# Patient Record
Sex: Male | Born: 1955 | Race: White | Hispanic: No | Marital: Single | Smoking: Former smoker
Health system: Southern US, Community
[De-identification: ages and names within clinical notes are randomized; demographics above are authoritative.]

## PROBLEM LIST (undated history)

## (undated) DIAGNOSIS — L409 Psoriasis, unspecified: Secondary | ICD-10-CM

## (undated) DIAGNOSIS — T8859XA Other complications of anesthesia, initial encounter: Secondary | ICD-10-CM

## (undated) DIAGNOSIS — U071 COVID-19: Secondary | ICD-10-CM

## (undated) DIAGNOSIS — Z789 Other specified health status: Secondary | ICD-10-CM

## (undated) DIAGNOSIS — K219 Gastro-esophageal reflux disease without esophagitis: Secondary | ICD-10-CM

## (undated) HISTORY — DX: Psoriasis, unspecified: L40.9

## (undated) HISTORY — PX: ELBOW SURGERY: SHX618

---

## 2012-05-28 ENCOUNTER — Other Ambulatory Visit: Payer: Self-pay | Admitting: Family Medicine

## 2012-05-28 ENCOUNTER — Encounter: Payer: Self-pay | Admitting: Family Medicine

## 2012-05-28 ENCOUNTER — Ambulatory Visit (INDEPENDENT_AMBULATORY_CARE_PROVIDER_SITE_OTHER): Payer: BC Managed Care – HMO | Admitting: Family Medicine

## 2012-05-28 VITALS — BP 120/90 | HR 72 | Temp 98.4°F | Ht 69.0 in | Wt 207.0 lb

## 2012-05-28 DIAGNOSIS — Z23 Encounter for immunization: Secondary | ICD-10-CM

## 2012-05-28 DIAGNOSIS — Z79899 Other long term (current) drug therapy: Secondary | ICD-10-CM

## 2012-05-28 DIAGNOSIS — L409 Psoriasis, unspecified: Secondary | ICD-10-CM | POA: Insufficient documentation

## 2012-05-28 DIAGNOSIS — F17200 Nicotine dependence, unspecified, uncomplicated: Secondary | ICD-10-CM

## 2012-05-28 DIAGNOSIS — Z136 Encounter for screening for cardiovascular disorders: Secondary | ICD-10-CM

## 2012-05-28 DIAGNOSIS — Z0000 Encounter for general adult medical examination without abnormal findings: Secondary | ICD-10-CM

## 2012-05-28 DIAGNOSIS — R972 Elevated prostate specific antigen [PSA]: Secondary | ICD-10-CM

## 2012-05-28 DIAGNOSIS — Z1211 Encounter for screening for malignant neoplasm of colon: Secondary | ICD-10-CM

## 2012-05-28 DIAGNOSIS — Z720 Tobacco use: Secondary | ICD-10-CM

## 2012-05-28 DIAGNOSIS — Z125 Encounter for screening for malignant neoplasm of prostate: Secondary | ICD-10-CM

## 2012-05-28 LAB — CBC WITH DIFFERENTIAL/PLATELET
Basophils Absolute: 0 10*3/uL (ref 0.0–0.1)
Eosinophils Relative: 2.4 % (ref 0.0–5.0)
HCT: 45.8 % (ref 39.0–52.0)
Hemoglobin: 15.2 g/dL (ref 13.0–17.0)
Lymphs Abs: 1.5 10*3/uL (ref 0.7–4.0)
MCV: 99.1 fl (ref 78.0–100.0)
Monocytes Absolute: 0.5 10*3/uL (ref 0.1–1.0)
Monocytes Relative: 7.1 % (ref 3.0–12.0)
Neutro Abs: 5.4 10*3/uL (ref 1.4–7.7)
Platelets: 195 10*3/uL (ref 150.0–400.0)
RDW: 13.9 % (ref 11.5–14.6)

## 2012-05-28 LAB — LIPID PANEL
Cholesterol: 173 mg/dL (ref 0–200)
HDL: 38.8 mg/dL — ABNORMAL LOW (ref >39.00)
LDL Cholesterol: 98 mg/dL (ref 0–99)
VLDL: 35.8 mg/dL (ref 0.0–40.0)

## 2012-05-28 LAB — COMPREHENSIVE METABOLIC PANEL
Alkaline Phosphatase: 58 U/L (ref 39–117)
CO2: 24 mEq/L (ref 19–32)
Creatinine, Ser: 1.1 mg/dL (ref 0.4–1.5)
GFR: 72.65 mL/min (ref >60.00)
Glucose, Bld: 96 mg/dL (ref 70–99)
Sodium: 138 mEq/L (ref 135–145)
Total Bilirubin: 0.5 mg/dL (ref 0.3–1.2)
Total Protein: 6.9 g/dL (ref 6.0–8.3)

## 2012-05-28 MED ORDER — VARENICLINE TARTRATE 0.5 MG X 11 & 1 MG X 42 PO MISC: ORAL | Status: DC

## 2012-05-28 MED ORDER — AZITHROMYCIN 250 MG PO TABS: ORAL_TABLET | ORAL | Status: AC

## 2012-05-28 MED ORDER — SILDENAFIL CITRATE 100 MG PO TABS: 50.0000 mg | ORAL_TABLET | Freq: Every day | ORAL | Status: DC | PRN

## 2012-05-28 MED ORDER — METHOTREXATE 2.5 MG PO TABS: ORAL_TABLET | ORAL | Status: DC

## 2012-05-28 NOTE — Progress Notes (Signed)
Subjective:    Patient ID: Peter Rose, male    DOB: July 24, 1956, 56 y.o.   MRN: [id number]  HPI  56 yo male here to establish care and for CPX.  Psorasis- has been well controlled on MTX.  No current plaques.  Tobacco abuse- has smoke 1 ppd most of his adult life.  He is ready to quit.  Has never had a colonoscopy. NO known family h/o prostate CA.  Patient Active Problem List  Diagnosis  . Routine general medical examination at a health care facility  . Psoriasis  . Tobacco abuse   Past Medical History  Diagnosis Date  . Psoriasis    No past surgical history on file. History  Substance Use Topics  . Smoking status: Current Everyday Smoker  . Smokeless tobacco: Not on file  . Alcohol Use: Not on file   No family history on file. Allergies  Allergen Reactions  . Vicodin (Hydrocodone-Acetaminophen)     Pt feels "wired" with pain meds   Current Outpatient Prescriptions on File Prior to Visit  Medication Sig Dispense Refill  . sildenafil (VIAGRA) 100 MG tablet Take 0.5-1 tablets (50-100 mg total) by mouth daily as needed for erectile dysfunction.  5 tablet  11   The PMH, PSH, Social History, Family History, Medications, and allergies have been reviewed in [facility], and have been updated if relevant.   Review of Systems    See HPI Patient reports no  vision/ hearing changes,anorexia, weight change, fever ,adenopathy, persistant / recurrent hoarseness, swallowing issues, chest pain, edema,persistant / recurrent cough, hemoptysis, dyspnea(rest, exertional, paroxysmal nocturnal), gastrointestinal  bleeding (melena, rectal bleeding), abdominal pain, excessive heart burn, GU symptoms(dysuria, hematuria, pyuria, voiding/incontinence  Issues) syncope, focal weakness, severe memory loss, concerning skin lesions, depression, anxiety, abnormal bruising/bleeding, major joint swelling.    Objective:   Physical Exam BP 120/90  Pulse 72  Temp 98.4 F (36.9 C)  Ht 5\' 9"  (1.753 m)  Wt  207 lb (93.895 kg)  BMI 30.57 kg/m2 General:  overweght male in NAD Eyes:  PERRL Ears:  External ear exam shows no significant lesions or deformities.  Otoscopic examination reveals clear canals, tympanic membranes are intact bilaterally without bulging, retraction, inflammation or discharge. Hearing is grossly normal bilaterally. Nose:  External nasal examination shows no deformity or inflammation. Nasal mucosa are pink and moist without lesions or exudates. Mouth:  Oral mucosa and oropharynx without lesions or exudates.  Teeth in good repair. Neck:  no carotid bruit or thyromegaly no cervical or supraclavicular lymphadenopathy  Lungs:  Normal respiratory effort, chest expands symmetrically. Lungs are clear to auscultation, no crackles or wheezes. Heart:  Normal rate and regular rhythm. S1 and S2 normal without gallop, murmur, click, rub or other extra sounds. Abdomen:  Bowel sounds positive,abdomen soft and non-tender without masses, organomegaly or hernias noted. Pulses:  R and L posterior tibial pulses are full and equal bilaterally  Extremities:  no edema       Assessment & Plan:   1. Routine general medical examination at a health care facility  Reviewed preventive care protocols, scheduled due services, and updated immunizations Discussed nutrition, exercise, diet, and healthy lifestyle.    2. Tobacco abuse  Ready to quit.  Discussed tx options.  He would like to try chantix- discussed possible side effects- starter pack sent to pharmacy.   3. Long term use of drug  CBC with Differential, Comprehensive metabolic panel  4. Screening for ischemic heart disease  Lipid Panel  5. Screening for  colon cancer  Ambulatory referral to Gastroenterology  6. Screening for prostate cancer  Prostate cancer screening and PSA options (with potential risks and benefits of testing vs not testing) were discussed along with recent recs/guidelines.  He desires testing PSA at this point.  PSA  7.  Immunization due  Tdap vaccine greater than or equal to 7yo IM

## 2012-05-28 NOTE — Patient Instructions (Addendum)
Wonderful to meet you. We will call you with your lab results from today. Please stop by to see Shirlee Limerick on your way out to set up your colonoscopy appointment.

## 2012-05-29 ENCOUNTER — Telehealth: Payer: Self-pay | Admitting: *Deleted

## 2012-05-29 NOTE — Telephone Encounter (Signed)
Advised patient of lab results.  He doesn't want referral to urologist at this time, says he is not having any prostate problems.

## 2012-05-29 NOTE — Telephone Encounter (Signed)
Noted  

## 2012-06-25 ENCOUNTER — Telehealth: Payer: Self-pay | Admitting: Family Medicine

## 2012-06-25 NOTE — Telephone Encounter (Signed)
Hi Dr. Dayton Martes,  Mr. 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 This is a new patient as of 05/28/12 (which was first & last appointment for this patient) so I wasn't sure if there was something else that may need to be done so I asked Jacki Cones and she said to send you a message because the DOT physical is a little different than a standard physical & since he was a new patient I wasn't for sure if a physical was actually done.  Please let me know if the patient needs to schedule for a DOT physical or if his last visit is sufficient to complete a DOT form.  Thanks  Marrianne Mood Dob: 2056/05/07

## 2012-06-25 NOTE — Telephone Encounter (Signed)
Yes he needs an appt for a DOT physical. Thank you.

## 2012-06-26 NOTE — Telephone Encounter (Signed)
Noted! Thank you

## 2012-06-26 NOTE — Telephone Encounter (Signed)
Pt needed this fairly soon, so I put 2 slots together on 9/18.

## 2012-07-02 ENCOUNTER — Ambulatory Visit: Payer: BC Managed Care – HMO | Admitting: Family Medicine

## 2012-08-13 ENCOUNTER — Encounter: Payer: BC Managed Care – HMO | Admitting: Internal Medicine

## 2012-11-14 ENCOUNTER — Other Ambulatory Visit: Payer: Self-pay | Admitting: *Deleted

## 2012-11-14 MED ORDER — LEVOCETIRIZINE DIHYDROCHLORIDE 5 MG PO TABS: 5.0000 mg | ORAL_TABLET | Freq: Every evening | ORAL | Status: DC

## 2012-11-14 NOTE — Telephone Encounter (Signed)
Received refill request for methotrexate. Pt is also on fexofenadine 180 mg, but is requesting an rx for something stronger, because it is no longer working for him. Pharmacy added a note that his insurance will cover generic xyzal for a $0 copay.

## 2012-11-14 NOTE — Telephone Encounter (Signed)
Spoke with patient.  He said pt has been getting this from a doctor in [place] but he says you told him you were ok with prescribing this as long as he gets periodic lab work.  Please advise.

## 2012-11-14 NOTE — Telephone Encounter (Signed)
Noted  Ok to refill

## 2012-11-14 NOTE — Telephone Encounter (Signed)
I do not typically prescribe methotrexate.  Who has been prescribing this for him?  Xyzal rx sent to his pharmacy.

## 2012-11-17 MED ORDER — METHOTREXATE 2.5 MG PO TABS: ORAL_TABLET | ORAL | Status: DC

## 2012-11-17 NOTE — Telephone Encounter (Signed)
In 3 months

## 2012-11-17 NOTE — Telephone Encounter (Signed)
Advised patient, medicine sent to pharmacy.  Pt is asking when do you want to check his labs.

## 2012-11-18 NOTE — Telephone Encounter (Signed)
Lab appt scheduled.

## 2013-01-19 ENCOUNTER — Telehealth: Payer: Self-pay

## 2013-01-19 MED ORDER — BUPROPION HCL ER (SR) 150 MG PO TB12: 150.0000 mg | ORAL_TABLET | Freq: Two times a day (BID) | ORAL | Status: DC

## 2013-01-19 NOTE — Telephone Encounter (Signed)
Pt does not have h/o seizures.  He says he has not tried any other medication for smoking cessation.

## 2013-01-19 NOTE — Telephone Encounter (Signed)
Please verify he does not have h/o seizures.  Another medication would be zyban, or wellbutrin.  Has he tried this for smoking cessation?

## 2013-01-19 NOTE — Telephone Encounter (Signed)
rx sent to his pharmacy.  Please have him call in 3 weeks with an update.  Please also read package insert for side effects since he did not have appt to discuss.  If he has any questions, please feel free to call me.

## 2013-01-19 NOTE — Telephone Encounter (Signed)
Pt  tried Chantix 3 weeks and it did not work to help pt stop smoking/ Pt presently smoking 1 pk daily. Pt wants another med to help stop smoking. [facility].Please advise.

## 2013-02-10 ENCOUNTER — Telehealth: Payer: Self-pay

## 2013-02-10 NOTE — Telephone Encounter (Signed)
Pt left v/m requesting call back to give fax # to release med records to. Left v/m for pt to call back.(pt will need to sign record release and the Health port will copy records).

## 2013-02-10 NOTE — Telephone Encounter (Signed)
Spoke with patient, he has not started zyban yet but is going to, advised as instructed below.

## 2013-02-11 NOTE — Telephone Encounter (Signed)
Left vm for pt to callback 

## 2013-02-16 ENCOUNTER — Other Ambulatory Visit: Payer: BC Managed Care – HMO

## 2013-02-16 NOTE — Telephone Encounter (Signed)
Patient notified as instructed by telephone. Pt will come to office to sign record release.

## 2013-06-28 ENCOUNTER — Other Ambulatory Visit: Payer: Self-pay | Admitting: Family Medicine

## 2013-06-29 NOTE — Telephone Encounter (Signed)
Ok to refill one time only.  Needs to have labs for further refills.

## 2013-07-01 ENCOUNTER — Other Ambulatory Visit: Payer: Self-pay | Admitting: Family Medicine

## 2013-07-01 NOTE — Telephone Encounter (Signed)
Refill request for viagra.  Patient established care 05/2012 but has not been seen since, and has no upcoming appts.

## 2013-08-03 ENCOUNTER — Other Ambulatory Visit: Payer: Self-pay | Admitting: Family Medicine

## 2013-08-03 NOTE — Telephone Encounter (Signed)
Electronic refill request.  Please advise. 

## 2013-08-04 ENCOUNTER — Other Ambulatory Visit: Payer: Self-pay | Admitting: Internal Medicine

## 2013-08-04 ENCOUNTER — Other Ambulatory Visit: Payer: Self-pay | Admitting: *Deleted

## 2013-08-04 DIAGNOSIS — L409 Psoriasis, unspecified: Secondary | ICD-10-CM

## 2013-08-04 MED ORDER — METHOTREXATE 2.5 MG PO TABS: ORAL_TABLET | ORAL | Status: DC

## 2013-08-04 NOTE — Telephone Encounter (Signed)
Left detailed message on voicemail to call to schedule lab appt.

## 2013-08-04 NOTE — Telephone Encounter (Signed)
Pt needs BMET prior to refill

## 2013-08-05 ENCOUNTER — Other Ambulatory Visit (INDEPENDENT_AMBULATORY_CARE_PROVIDER_SITE_OTHER): Payer: BC Managed Care – PPO

## 2013-08-05 DIAGNOSIS — L409 Psoriasis, unspecified: Secondary | ICD-10-CM

## 2013-08-05 DIAGNOSIS — L408 Other psoriasis: Secondary | ICD-10-CM

## 2013-08-05 LAB — COMPREHENSIVE METABOLIC PANEL
AST: 29 U/L (ref 0–37)
Albumin: 4.1 g/dL (ref 3.5–5.2)
Alkaline Phosphatase: 67 U/L (ref 39–117)
Glucose, Bld: 99 mg/dL (ref 70–99)
Sodium: 139 mEq/L (ref 135–145)
Total Bilirubin: 0.6 mg/dL (ref 0.3–1.2)
Total Protein: 7.2 g/dL (ref 6.0–8.3)

## 2013-08-05 LAB — CBC
HCT: 45.7 % (ref 39.0–52.0)
MCHC: 34.6 g/dL (ref 30.0–36.0)
MCV: 96.6 fl (ref 78.0–100.0)
RBC: 4.73 Mil/uL (ref 4.22–5.81)
RDW: 14.7 % — ABNORMAL HIGH (ref 11.5–14.6)

## 2013-09-01 ENCOUNTER — Other Ambulatory Visit: Payer: Self-pay | Admitting: Family Medicine

## 2013-09-14 ENCOUNTER — Telehealth: Payer: Self-pay | Admitting: Family Medicine

## 2013-09-14 NOTE — Telephone Encounter (Signed)
I do not see that info or dx for it -- what symptoms does he have and what was he dx with originally -- ? GERD or gastritis  Also for insurance purposes has he tried zantac, prilosec or other meds otc?  Thanks -please let me know

## 2013-09-14 NOTE — Telephone Encounter (Signed)
Pt wants to be prescribed Nexium.  He says we have all of his records from his previous office and we should be able to see where he was prescribed this by his previous doctor. [facility] on [address] in [place]

## 2013-09-16 MED ORDER — ESOMEPRAZOLE MAGNESIUM 40 MG PO PACK: 40.0000 mg | PACK | Freq: Every day | ORAL | Status: DC

## 2013-09-16 NOTE — Telephone Encounter (Signed)
Pt has GERD and has tried prilosec, zantac, and protonix and the only thing that has worked is the Nexium, pt usually gets 3 months supply (#90) and takes it once daily, please advise  Also back in April he signed a release so we could get his medical records so I will send Peter Rose. Message requesting the status of his records

## 2013-09-16 NOTE — Telephone Encounter (Signed)
I will send it in - follow up with PCP if no improvement

## 2013-09-17 MED ORDER — ESOMEPRAZOLE MAGNESIUM 40 MG PO CPDR: 40.0000 mg | DELAYED_RELEASE_CAPSULE | Freq: Every day | ORAL | Status: DC

## 2013-09-17 NOTE — Telephone Encounter (Signed)
Pt.notified

## 2013-09-28 ENCOUNTER — Ambulatory Visit (INDEPENDENT_AMBULATORY_CARE_PROVIDER_SITE_OTHER): Payer: BC Managed Care – PPO | Admitting: Internal Medicine

## 2013-09-28 ENCOUNTER — Encounter: Payer: Self-pay | Admitting: Internal Medicine

## 2013-09-28 VITALS — BP 136/82 | HR 73 | Temp 98.0°F | Ht 69.0 in | Wt 213.5 lb

## 2013-09-28 DIAGNOSIS — J069 Acute upper respiratory infection, unspecified: Secondary | ICD-10-CM

## 2013-09-28 MED ORDER — HYDROCODONE-HOMATROPINE 5-1.5 MG/5ML PO SYRP: 5.0000 mL | ORAL_SOLUTION | Freq: Three times a day (TID) | ORAL | Status: DC | PRN

## 2013-09-28 MED ORDER — AZITHROMYCIN 250 MG PO TABS: ORAL_TABLET | ORAL | Status: DC

## 2013-09-28 MED ORDER — SILDENAFIL CITRATE 100 MG PO TABS: ORAL_TABLET | ORAL | Status: DC

## 2013-09-28 NOTE — Progress Notes (Signed)
HPI  Pt presents to the clinic today with c/o cough, headache and nasal congestion.  This started about 1 week ago. He denies fever but has had chills and body aches. He has tried Claritin, cough drops and Mucinex without any relief. He has no history of allergies or asthma. He is a current smoker.  Review of Systems      Past Medical History  Diagnosis Date  . Psoriasis     History reviewed. No pertinent family history.  History   Social History  . Marital Status: Married    Spouse Name: N/A    Number of Children: N/A  . Years of Education: N/A   Occupational History  . Not on file.   Social History Main Topics  . Smoking status: Current Every Day Smoker  . Smokeless tobacco: Not on file  . Alcohol Use: Yes  . Drug Use: No  . Sexual Activity: Not on file   Other Topics Concern  . Not on file   Social History Narrative  . No narrative on file    Allergies  Allergen Reactions  . Vicodin [Hydrocodone-Acetaminophen]     Pt feels "wired" with pain meds     Constitutional: Positive headache, fatigue. Denies fever or abrupt weight changes.  HEENT:  Positive sore throat. Denies eye redness, eye pain, pressure behind the eyes, facial pain, nasal congestion, ear pain, ringing in the ears, wax buildup, runny nose or bloody nose. Respiratory: Positive cough. Denies difficulty breathing or shortness of breath.  Cardiovascular: Denies chest pain, chest tightness, palpitations or swelling in the hands or feet.   No other specific complaints in a complete review of systems (except as listed in HPI above).  Objective:   BP 136/82  Pulse 73  Temp(Src) 98 F (36.7 C) (Oral)  Ht 5\' 9"  (1.753 m)  Wt 213 lb 8 oz (96.843 kg)  BMI 31.51 kg/m2  SpO2 97% Wt Readings from Last 3 Encounters:  09/28/13 213 lb 8 oz (96.843 kg)  05/28/12 207 lb (93.895 kg)     General: Appears his stated age, well developed, well nourished in NAD. HEENT: Head: normal shape and size; Eyes:  sclera white, no icterus, conjunctiva pink, PERRLA and EOMs intact; Ears: Tm's gray and intact, normal light reflex; Nose: mucosa pink and moist, septum midline; Throat/Mouth: + PND. Teeth present, mucosa erythematous and moist, no exudate noted, no lesions or ulcerations noted.  Neck: Mild cervical lymphadenopathy. Neck supple, trachea midline. No massses, lumps or thyromegaly present.  Cardiovascular: Normal rate and rhythm. S1,S2 noted.  No murmur, rubs or gallops noted. No JVD or BLE edema. No carotid bruits noted. Pulmonary/Chest: Normal effort and positive vesicular breath sounds. No respiratory distress. No wheezes, rales or ronchi noted.      Assessment & Plan:   Upper Respiratory Infection  Get some rest and drink plenty of water Do salt water gargles for the sore throat eRx for Azithromax x 5 days eRx for Hycodan cough syrup  RTC as needed or if symptoms persist.

## 2013-09-28 NOTE — Progress Notes (Signed)
Pre-visit discussion using our clinic review tool. No additional management support is needed unless otherwise documented below in the visit note.  

## 2013-09-28 NOTE — Patient Instructions (Signed)

## 2013-09-29 ENCOUNTER — Telehealth: Payer: Self-pay

## 2013-09-29 MED ORDER — BENZONATATE 200 MG PO CAPS: 200.0000 mg | ORAL_CAPSULE | Freq: Three times a day (TID) | ORAL | Status: DC | PRN

## 2013-09-29 NOTE — Telephone Encounter (Signed)
Pt notified Rx sent and hycodan added to med intol list

## 2013-09-29 NOTE — Telephone Encounter (Signed)
Pt called and pt had a lot of coughing last night but cannot take the Hycodan; caused pt to feel "wired" and jittery, could not sleep.[facility]. Pt request cb. Pt is not having any difficulty breathing or rash. Pt is not going to take anymore Hycodan.Pt does not want to wait for call back this afternoon at Nicki Reaper NP return.Please advise.

## 2013-09-29 NOTE — Telephone Encounter (Signed)
Let's try some tessalon for cough - [facility] to [facility] Please add the hycodan to his med intol list

## 2013-10-26 ENCOUNTER — Other Ambulatory Visit: Payer: Self-pay | Admitting: Family Medicine

## 2013-10-26 NOTE — Telephone Encounter (Signed)
Pt requesting medication refill. Last ov 09/2013. pls advise

## 2013-11-27 ENCOUNTER — Ambulatory Visit: Payer: Self-pay | Admitting: Orthopedic Surgery

## 2013-12-21 ENCOUNTER — Other Ambulatory Visit: Payer: Self-pay | Admitting: *Deleted

## 2013-12-21 NOTE — Telephone Encounter (Signed)
Received faxed refill request from pharmacy.  Last refill 10/26/13 #40, last office visit 09/28/13. Is it okay to refill medication?

## 2013-12-22 MED ORDER — METHOTREXATE 2.5 MG PO TABS: ORAL_TABLET | ORAL | Status: DC

## 2014-01-20 ENCOUNTER — Other Ambulatory Visit: Payer: Self-pay | Admitting: Family Medicine

## 2014-01-20 NOTE — Telephone Encounter (Signed)
Spoke to pt and scheduled ov on 01/22/14 to receive medication refill

## 2014-01-22 ENCOUNTER — Ambulatory Visit (INDEPENDENT_AMBULATORY_CARE_PROVIDER_SITE_OTHER): Payer: BC Managed Care – PPO | Admitting: Internal Medicine

## 2014-01-22 ENCOUNTER — Encounter: Payer: Self-pay | Admitting: Internal Medicine

## 2014-01-22 VITALS — BP 118/78 | HR 81 | Temp 98.2°F | Wt 215.5 lb

## 2014-01-22 DIAGNOSIS — L409 Psoriasis, unspecified: Secondary | ICD-10-CM

## 2014-01-22 DIAGNOSIS — L408 Other psoriasis: Secondary | ICD-10-CM

## 2014-01-22 DIAGNOSIS — J309 Allergic rhinitis, unspecified: Secondary | ICD-10-CM

## 2014-01-22 MED ORDER — LEVOCETIRIZINE DIHYDROCHLORIDE 5 MG PO TABS: ORAL_TABLET | ORAL | Status: DC

## 2014-01-22 MED ORDER — METHOTREXATE 2.5 MG PO TABS: ORAL_TABLET | ORAL | Status: DC

## 2014-01-22 NOTE — Progress Notes (Signed)
Subjective:    Patient ID: Peter Rose, male    DOB: 08-15-1956, 58 y.o.   MRN: [id number]  HPI  Pt presents to the clinic today with c/o medication refills. He request his xyzal refill but was told he needed to be seen for further refills. His allergies are acting up. He also reports that he needs refills on his methotrexate. He doesn't take it daily but needs it for when is psoriasis flares up. He does have a small patch on his left forearm now.  Review of Systems   Past Medical History  Diagnosis Date  . Psoriasis     Current Outpatient Prescriptions  Medication Sig Dispense Refill  . levocetirizine (XYZAL) 5 MG tablet take 1 tablet by mouth every evening  30 tablet  0  . methotrexate (RHEUMATREX) 2.5 MG tablet take 10 tablets by mouth every week  40 tablet  0  . sildenafil (VIAGRA) 100 MG tablet take 1 tablet by mouth EVERY 36 HOURS if needed  8 tablet  0   No current facility-administered medications for this visit.    Allergies  Allergen Reactions  . Codeine Other (See Comments)    Eels wirey and jittery  . Other     HYCODAN: Makes pt feel weird/jittery  . Vicodin [Hydrocodone-Acetaminophen]     Pt feels "wired" with pain meds    History reviewed. No pertinent family history.  History   Social History  . Marital Status: Married    Spouse Name: N/A    Number of Children: N/A  . Years of Education: N/A   Occupational History  . Not on file.   Social History Main Topics  . Smoking status: Current Every Day Smoker  . Smokeless tobacco: Not on file  . Alcohol Use: Yes     Comment: occasional  . Drug Use: No  . Sexual Activity: Not on file   Other Topics Concern  . Not on file   Social History Narrative  . No narrative on file     Constitutional: Denies fever, malaise, fatigue, headache or abrupt weight changes.  Skin: Pt reports psoriasis on left forearm.  HEENT: Pt reports nasal congestion. Denies eye pain, eye redness, ear pain, ringing in the  ears, wax buildup, runny nose,  bloody nose, or sore throat. Respiratory: Denies difficulty breathing, shortness of breath, cough or sputum production.     No other specific complaints in a complete review of systems (except as listed in HPI above).  Objective:   Physical Exam  BP 118/78  Pulse 81  Temp(Src) 98.2 F (36.8 C) (Oral)  Wt 215 lb 8 oz (97.75 kg)  SpO2 97% Wt Readings from Last 3 Encounters:  01/22/14 215 lb 8 oz (97.75 kg)  09/28/13 213 lb 8 oz (96.843 kg)  05/28/12 207 lb (93.895 kg)    General: Appears his stated age, well developed, well nourished in NAD. Skin: 2 small round psoriasis patches noted on left forearm. HEENT: Head: normal shape and size; Eyes: sclera white, no icterus, conjunctiva pink, PERRLA and EOMs intact; Ears: Tm's gray and intact, normal light reflex; Nose: mucosa boggy and moist, septum midline; Throat/Mouth: Teeth present, mucosa pink and moist, no exudate, lesions or ulcerations noted.  Neck: Normal range of motion. Neck supple, trachea midline. No massses, lumps or thyromegaly present.  Cardiovascular: Normal rate and rhythm. S1,S2 noted.  No murmur, rubs or gallops noted. No JVD or BLE edema. No carotid bruits noted. Pulmonary/Chest: Normal effort and positive vesicular breath  sounds. No respiratory distress. No wheezes, rales or ronchi noted.    BMET    Component Value Date/Time   NA 139 08/05/2013 0742   K 4.1 08/05/2013 0742   CL 105 08/05/2013 0742   CO2 27 08/05/2013 0742   GLUCOSE 99 08/05/2013 0742   BUN 11 08/05/2013 0742   CREATININE 1.1 08/05/2013 0742   CALCIUM 9.2 08/05/2013 0742    Lipid Panel     Component Value Date/Time   CHOL 173 05/28/2012 1129   TRIG 179.0* 05/28/2012 1129   HDL 38.80* 05/28/2012 1129   CHOLHDL 4 05/28/2012 1129   VLDL 35.8 05/28/2012 1129   LDLCALC 98 05/28/2012 1129    CBC    Component Value Date/Time   WBC 6.7 08/05/2013 0742   RBC 4.73 08/05/2013 0742   HGB 15.8 08/05/2013 0742   HCT  45.7 08/05/2013 0742   PLT 218.0 08/05/2013 0742   MCV 96.6 08/05/2013 0742   MCHC 34.6 08/05/2013 0742   RDW 14.7* 08/05/2013 0742   LYMPHSABS 1.5 05/28/2012 1129   MONOABS 0.5 05/28/2012 1129   EOSABS 0.2 05/28/2012 1129   BASOSABS 0.0 05/28/2012 1129    Hgb A1C No results found for this basename: HGBA1C         Assessment & Plan:   Allergic Rhinitis:  Flaring up due to the pollen Medication refilled to day  Psoriasis:  rheumetrex refilled today  RTC as needed or if symptoms persist or worsen

## 2014-01-22 NOTE — Progress Notes (Signed)
Pre visit review using our clinic review tool, if applicable. No additional management support is needed unless otherwise documented below in the visit note. 

## 2014-01-22 NOTE — Patient Instructions (Addendum)
Allergic Rhinitis Allergic rhinitis is when the mucous membranes in the nose respond to allergens. Allergens are particles in the air that cause your body to have an allergic reaction. This causes you to release allergic antibodies. Through a chain of events, these eventually cause you to release histamine into the blood stream. Although meant to protect the body, it is this release of histamine that causes your discomfort, such as frequent sneezing, congestion, and an itchy, runny nose.  CAUSES  Seasonal allergic rhinitis (hay fever) is caused by pollen allergens that may come from grasses, trees, and weeds. Year-round allergic rhinitis (perennial allergic rhinitis) is caused by allergens such as house dust mites, pet dander, and mold spores.  SYMPTOMS   Nasal stuffiness (congestion).  Itchy, runny nose with sneezing and tearing of the eyes. DIAGNOSIS  Your health care provider can help you determine the allergen or allergens that trigger your symptoms. If you and your health care provider are unable to determine the allergen, skin or blood testing may be used. TREATMENT  Allergic Rhinitis does not have a cure, but it can be controlled by:  Medicines and allergy shots (immunotherapy).  Avoiding the allergen. Hay fever may often be treated with antihistamines in pill or nasal spray forms. Antihistamines block the effects of histamine. There are over-the-counter medicines that may help with nasal congestion and swelling around the eyes. Check with your health care provider before taking or giving this medicine.  If avoiding the allergen or the medicine prescribed do not work, there are many new medicines your health care provider can prescribe. Stronger medicine may be used if initial measures are ineffective. Desensitizing injections can be used if medicine and avoidance does not work. Desensitization is when a patient is given ongoing shots until the body becomes less sensitive to the allergen.  Make sure you follow up with your health care provider if problems continue. HOME CARE INSTRUCTIONS It is not possible to completely avoid allergens, but you can reduce your symptoms by taking steps to limit your exposure to them. It helps to know exactly what you are allergic to so that you can avoid your specific triggers. SEEK MEDICAL CARE IF:   You have a fever.  You develop a cough that does not stop easily (persistent).  You have shortness of breath.  You start wheezing.  Symptoms interfere with normal daily activities. Document Released: 06/26/2001 Document Revised: 07/22/2013 Document Reviewed: 06/08/2013 Exit[employer] Patient Information 2014 [employer].  

## 2014-01-25 ENCOUNTER — Telehealth: Payer: Self-pay | Admitting: Family Medicine

## 2014-01-25 NOTE — Telephone Encounter (Signed)
Relevant patient education assigned to patient using Emmi. ° °

## 2014-02-15 ENCOUNTER — Other Ambulatory Visit: Payer: Self-pay | Admitting: Family Medicine

## 2014-02-15 NOTE — Telephone Encounter (Signed)
Pt calling for refill status advised refills sent to [facility]. Pt appreciative.

## 2014-02-15 NOTE — Telephone Encounter (Signed)
Last office visit 01/22/14 with Golden Hurter.  Ok to refill?

## 2014-04-04 ENCOUNTER — Other Ambulatory Visit: Payer: Self-pay | Admitting: Family Medicine

## 2014-04-05 NOTE — Telephone Encounter (Signed)
Last ov with [name] Silversmith on 01/22/14.

## 2014-04-06 ENCOUNTER — Ambulatory Visit: Payer: Self-pay | Admitting: Emergency Medicine

## 2014-04-06 LAB — DOT URINE DIP
Glucose,UR: NEGATIVE mg/dL (ref 0–75)
Protein: NEGATIVE
Specific Gravity: 1.01 (ref 1.003–1.030)

## 2014-05-27 ENCOUNTER — Other Ambulatory Visit: Payer: Self-pay | Admitting: Family Medicine

## 2014-06-22 ENCOUNTER — Other Ambulatory Visit: Payer: Self-pay | Admitting: Family Medicine

## 2014-08-05 ENCOUNTER — Other Ambulatory Visit: Payer: Self-pay | Admitting: Family Medicine

## 2014-09-17 ENCOUNTER — Other Ambulatory Visit: Payer: Self-pay | Admitting: Family Medicine

## 2014-09-30 ENCOUNTER — Encounter: Payer: Self-pay | Admitting: Family Medicine

## 2014-09-30 ENCOUNTER — Ambulatory Visit (INDEPENDENT_AMBULATORY_CARE_PROVIDER_SITE_OTHER): Payer: Self-pay | Admitting: Family Medicine

## 2014-09-30 VITALS — BP 118/78 | HR 75 | Temp 98.4°F | Wt 219.0 lb

## 2014-09-30 DIAGNOSIS — IMO0001 Reserved for inherently not codable concepts without codable children: Secondary | ICD-10-CM

## 2014-09-30 DIAGNOSIS — R030 Elevated blood-pressure reading, without diagnosis of hypertension: Secondary | ICD-10-CM

## 2014-09-30 DIAGNOSIS — F4321 Adjustment disorder with depressed mood: Secondary | ICD-10-CM

## 2014-09-30 DIAGNOSIS — F5105 Insomnia due to other mental disorder: Secondary | ICD-10-CM

## 2014-09-30 DIAGNOSIS — L409 Psoriasis, unspecified: Secondary | ICD-10-CM

## 2014-09-30 LAB — COMPREHENSIVE METABOLIC PANEL
ALK PHOS: 61 U/L (ref 39–117)
ALT: 32 U/L (ref 0–53)
AST: 30 U/L (ref 0–37)
Albumin: 4.3 g/dL (ref 3.5–5.2)
BILIRUBIN TOTAL: 0.5 mg/dL (ref 0.2–1.2)
BUN: 14 mg/dL (ref 6–23)
CO2: 27 meq/L (ref 19–32)
CREATININE: 1.2 mg/dL (ref 0.4–1.5)
Calcium: 9.9 mg/dL (ref 8.4–10.5)
Chloride: 104 mEq/L (ref 96–112)
GFR: 67.14 mL/min (ref >60.00)
Glucose, Bld: 87 mg/dL (ref 70–99)
Potassium: 4.4 mEq/L (ref 3.5–5.1)
SODIUM: 138 meq/L (ref 135–145)
Total Protein: 7.3 g/dL (ref 6.0–8.3)

## 2014-09-30 LAB — CBC WITH DIFFERENTIAL/PLATELET
BASOS ABS: 0 10*3/uL (ref 0.0–0.1)
Basophils Relative: 0.6 % (ref 0.0–3.0)
EOS ABS: 0.3 10*3/uL (ref 0.0–0.7)
Eosinophils Relative: 3.7 % (ref 0.0–5.0)
HCT: 47.8 % (ref 39.0–52.0)
Hemoglobin: 16 g/dL (ref 13.0–17.0)
LYMPHS ABS: 2.1 10*3/uL (ref 0.7–4.0)
Lymphocytes Relative: 27.1 % (ref 12.0–46.0)
MCHC: 33.4 g/dL (ref 30.0–36.0)
MCV: 96.2 fl (ref 78.0–100.0)
Monocytes Absolute: 0.8 10*3/uL (ref 0.1–1.0)
Monocytes Relative: 10.9 % (ref 3.0–12.0)
Neutro Abs: 4.5 10*3/uL (ref 1.4–7.7)
Neutrophils Relative %: 57.7 % (ref 43.0–77.0)
PLATELETS: 227 10*3/uL (ref 150.0–400.0)
RBC: 4.97 Mil/uL (ref 4.22–5.81)
RDW: 13.8 % (ref 11.5–15.5)
WBC: 7.8 10*3/uL (ref 4.0–10.5)

## 2014-09-30 MED ORDER — TRAZODONE HCL 50 MG PO TABS: 25.0000 mg | ORAL_TABLET | Freq: Every evening | ORAL | Status: DC | PRN

## 2014-09-30 MED ORDER — METHOTREXATE 2.5 MG PO TABS: ORAL_TABLET | ORAL | Status: DC

## 2014-09-30 NOTE — Progress Notes (Signed)
Patient ID: Peter Rose, male   DOB: 22-Nov-1955, 58 y.o.   MRN: [id number]   Subjective:   Patient ID: Peter Rose, male    DOB: Dec 23, 1955, 58 y.o.   MRN: [id number]  Peter Rose is a pleasant 58 y.o. year old male who presents to clinic today with Follow-up and Medication Refill  on 09/30/2014  HPI:  Psoriasis- has been having more flares.  Out of work since June 2015 and stress tends to cause flares for him. More on elbows and back of hands.  Takes MTX prn flares.  Asking for me to refill since he cannot afford to see derm right now. Lab Results  Component Value Date   WBC 6.7 08/05/2013   HGB 15.8 08/05/2013   HCT 45.7 08/05/2013   MCV 96.6 08/05/2013   PLT 218.0 08/05/2013   Lab Results  Component Value Date   ALT 32 08/05/2013   AST 29 08/05/2013   ALKPHOS 67 08/05/2013   BILITOT 0.6 08/05/2013   Since he lost his job, has been having more difficulty sleeping and feeling sad.  Not riding his harley as much- "just dont feel like it."  Denies any panic attacks. No SI or HI. Appetite good- Wt Readings from Last 3 Encounters:  09/30/14 219 lb (99.338 kg)  01/22/14 215 lb 8 oz (97.75 kg)  09/28/13 213 lb 8 oz (96.843 kg)   Current Outpatient Prescriptions on File Prior to Visit  Medication Sig Dispense Refill  . levocetirizine (XYZAL) 5 MG tablet take 1 tablet by mouth every evening 30 tablet 5  . VIAGRA 100 MG tablet take 1 tablet by mouth every 36 HOURS if needed 8 tablet 0   No current facility-administered medications on file prior to visit.    Allergies  Allergen Reactions  . Codeine Other (See Comments)    Eels wirey and jittery  . Other     HYCODAN: Makes pt feel weird/jittery  . Vicodin [Hydrocodone-Acetaminophen]     Pt feels "wired" with pain meds    Past Medical History  Diagnosis Date  . Psoriasis     No past surgical history on file.  No family history on file.  History   Social History  . Marital Status: Married    Spouse Name: N/A     Number of Children: N/A  . Years of Education: N/A   Occupational History  . Not on file.   Social History Main Topics  . Smoking status: Current Every Day Smoker  . Smokeless tobacco: Not on file  . Alcohol Use: Yes     Comment: occasional  . Drug Use: No  . Sexual Activity: Not on file   Other Topics Concern  . Not on file   Social History Narrative   The PMH, PSH, Social History, Family History, Medications, and allergies have been reviewed in [facility], and have been updated if relevant.   Review of Systems  Constitutional: Negative for appetite change.  Respiratory: Negative.   Cardiovascular: Negative.   Gastrointestinal: Negative.   Endocrine: Negative.   Genitourinary: Negative.   Skin: Positive for rash.  Neurological: Negative.   Psychiatric/Behavioral: Positive for sleep disturbance and dysphoric mood. Negative for suicidal ideas, hallucinations, behavioral problems, confusion, self-injury, decreased concentration and agitation. The patient is not nervous/anxious and is not hyperactive.   All other systems reviewed and are negative.      Objective:    BP 118/78 mmHg  Pulse 75  Temp(Src) 98.4 F (36.9 C) (Oral)  Wt 219  lb (99.338 kg)  SpO2 97% Wt Readings from Last 3 Encounters:  09/30/14 219 lb (99.338 kg)  01/22/14 215 lb 8 oz (97.75 kg)  09/28/13 213 lb 8 oz (96.843 kg)     Physical Exam  Constitutional: He is oriented to person, place, and time. He appears well-developed and well-nourished. No distress.  HENT:  Head: Normocephalic and atraumatic.  Eyes: Pupils are equal, round, and reactive to light.  Neck: Normal range of motion.  Cardiovascular: Normal rate, regular rhythm and normal heart sounds.   Pulmonary/Chest: Effort normal and breath sounds normal. No respiratory distress. He has no wheezes. He has no rales. He exhibits no tenderness.  Neurological: He is alert and oriented to person, place, and time. No cranial nerve deficit.  Skin:  Skin is warm and dry.     Psychiatric: He has a normal mood and affect. His behavior is normal. Judgment and thought content normal.          Assessment & Plan:   Psoriasis - Plan: CBC with Differential, Comprehensive metabolic panel  Insomnia secondary to situational depression  Elevated BP No Follow-up on file.

## 2014-09-30 NOTE — Assessment & Plan Note (Signed)
Deteriorated. Discussed risks and proper use of MTX- will refill for now but due for monitoring blood work.  Agrees to have done today. Orders Placed This Encounter  Procedures  . CBC with Differential  . Comprehensive metabolic panel

## 2014-09-30 NOTE — Progress Notes (Signed)
Pre visit review using our clinic review tool, if applicable. No additional management support is needed unless otherwise documented below in the visit note. 

## 2014-09-30 NOTE — Assessment & Plan Note (Signed)
New- he declines any psychotherapy or rx for depression which is appropriate at this point. Contracted for safety. >25 min spent with patient, at least half of which was spent on counseling insomnia.  The problem of recurrent insomnia is discussed. Avoidance of caffeine sources is strongly encouraged. Sleep hygiene issues are reviewed. eRx sent for trazodone after discussed possible adverse rxns/risks. Call or return to clinic prn if these symptoms worsen or fail to improve as anticipated. The patient indicates understanding of these issues and agrees with the plan.

## 2014-09-30 NOTE — Patient Instructions (Signed)
Great to see you.  We will call you with your lab results. 

## 2014-10-01 ENCOUNTER — Encounter: Payer: Self-pay | Admitting: *Deleted

## 2014-10-01 ENCOUNTER — Telehealth: Payer: Self-pay | Admitting: Family Medicine

## 2014-10-01 NOTE — Telephone Encounter (Signed)
emmi emailed °

## 2014-11-03 ENCOUNTER — Other Ambulatory Visit: Payer: Self-pay | Admitting: Family Medicine

## 2014-11-03 NOTE — Telephone Encounter (Signed)
Last f/u appt 09/2014. pls advise

## 2014-12-22 ENCOUNTER — Other Ambulatory Visit: Payer: Self-pay

## 2014-12-22 MED ORDER — METHOTREXATE 2.5 MG PO TABS: ORAL_TABLET | ORAL | Status: DC

## 2014-12-22 NOTE — Telephone Encounter (Signed)
Pt left v/m requesting more than one refill put on methotrexate; pt request cb. Pt last seen 09/30/2014. No future appt scheduled.

## 2015-02-14 ENCOUNTER — Telehealth: Payer: Self-pay | Admitting: *Deleted

## 2015-02-14 ENCOUNTER — Other Ambulatory Visit: Payer: Self-pay | Admitting: Family Medicine

## 2015-02-14 MED ORDER — METHOTREXATE 2.5 MG PO TABS: ORAL_TABLET | ORAL | Status: DC

## 2015-02-14 NOTE — Telephone Encounter (Signed)
Patient left a voicemail stating that he just got a refill on his Methotrexate and does not understand why he can not get more than one refill at a time. Patient stated that he was in a few months back and had lab work done. Patient stated that he use to get several refills at a time and wants to know what has changed?

## 2015-02-14 NOTE — Telephone Encounter (Signed)
That was our error.  I will send in more refills now.

## 2015-04-11 ENCOUNTER — Other Ambulatory Visit: Payer: Self-pay | Admitting: *Deleted

## 2015-04-11 MED ORDER — METHOTREXATE 2.5 MG PO TABS: ORAL_TABLET | ORAL | Status: DC

## 2015-04-11 MED ORDER — SILDENAFIL CITRATE 100 MG PO TABS: ORAL_TABLET | ORAL | Status: DC

## 2015-06-08 ENCOUNTER — Telehealth: Payer: Self-pay

## 2015-06-08 MED ORDER — VARENICLINE TARTRATE 0.5 MG X 11 & 1 MG X 42 PO MISC: ORAL | Status: DC

## 2015-06-08 MED ORDER — VARENICLINE TARTRATE 0.5 MG X 11 & 1 MG X 42 PO MISC: ORAL | Status: AC

## 2015-06-08 NOTE — Telephone Encounter (Signed)
Spoke to pt and advised per Dr Deborra Medina. States that he is not currently insured and is wanting Rx printed so that he is able to choose most Management consultant. Rx printed for sig 08/25

## 2015-06-08 NOTE — Telephone Encounter (Signed)
If he has been prescribed chantix before, appointment not necessary.  Please call in rx as entered,  but he needs to review drug information insert prior to starting.  He needs to choose a quit date prior to starting and I am very happy that he is ready to quit.

## 2015-06-08 NOTE — Telephone Encounter (Signed)
Pt left /vm; pt does not have health ins;pt does not want to schedule appt due to no ins. Pt wants to stop smoking and wants affordable med; possibly Chantix. Chantix and wellbutrin are on historical med list. Pt established care  05/28/12 and last f/u appt 09/30/14.  Usually advise pt to schedule appt to discuss use and possible side effects of Chantix. Please advise.

## 2015-06-19 ENCOUNTER — Other Ambulatory Visit: Payer: Self-pay | Admitting: Internal Medicine

## 2016-01-21 ENCOUNTER — Other Ambulatory Visit: Payer: Self-pay | Admitting: Family Medicine

## 2016-01-23 NOTE — Telephone Encounter (Signed)
Ok to refill one time only.  Needs OV for further refills. 

## 2016-01-23 NOTE — Telephone Encounter (Signed)
Pt has not been seen since 2015. pls advise

## 2016-01-24 NOTE — Telephone Encounter (Signed)
i have spoken to pt and scheduled f/u for 4/18. You will have to fill this medication as there is no instruction

## 2016-01-31 ENCOUNTER — Ambulatory Visit (INDEPENDENT_AMBULATORY_CARE_PROVIDER_SITE_OTHER): Payer: Self-pay | Admitting: Family Medicine

## 2016-01-31 ENCOUNTER — Encounter: Payer: Self-pay | Admitting: Family Medicine

## 2016-01-31 VITALS — BP 124/78 | HR 63 | Temp 98.1°F | Wt 213.0 lb

## 2016-01-31 DIAGNOSIS — Z125 Encounter for screening for malignant neoplasm of prostate: Secondary | ICD-10-CM

## 2016-01-31 DIAGNOSIS — F5105 Insomnia due to other mental disorder: Secondary | ICD-10-CM

## 2016-01-31 DIAGNOSIS — L409 Psoriasis, unspecified: Secondary | ICD-10-CM

## 2016-01-31 DIAGNOSIS — J301 Allergic rhinitis due to pollen: Secondary | ICD-10-CM

## 2016-01-31 DIAGNOSIS — Z720 Tobacco use: Secondary | ICD-10-CM

## 2016-01-31 DIAGNOSIS — J309 Allergic rhinitis, unspecified: Secondary | ICD-10-CM | POA: Insufficient documentation

## 2016-01-31 DIAGNOSIS — Z13220 Encounter for screening for lipoid disorders: Secondary | ICD-10-CM

## 2016-01-31 DIAGNOSIS — F4321 Adjustment disorder with depressed mood: Secondary | ICD-10-CM

## 2016-01-31 LAB — CBC WITH DIFFERENTIAL/PLATELET
BASOS PCT: 0.7 % (ref 0.0–3.0)
Basophils Absolute: 0 10*3/uL (ref 0.0–0.1)
EOS PCT: 2.6 % (ref 0.0–5.0)
Eosinophils Absolute: 0.1 10*3/uL (ref 0.0–0.7)
HEMATOCRIT: 45.3 % (ref 39.0–52.0)
Hemoglobin: 15.6 g/dL (ref 13.0–17.0)
LYMPHS PCT: 29.8 % (ref 12.0–46.0)
Lymphs Abs: 1.5 10*3/uL (ref 0.7–4.0)
MCHC: 34.6 g/dL (ref 30.0–36.0)
MCV: 95 fl (ref 78.0–100.0)
MONOS PCT: 7.9 % (ref 3.0–12.0)
Monocytes Absolute: 0.4 10*3/uL (ref 0.1–1.0)
NEUTROS ABS: 3 10*3/uL (ref 1.4–7.7)
Neutrophils Relative %: 59 % (ref 43.0–77.0)
PLATELETS: 211 10*3/uL (ref 150.0–400.0)
RBC: 4.76 Mil/uL (ref 4.22–5.81)
RDW: 15.4 % (ref 11.5–15.5)
WBC: 5.1 10*3/uL (ref 4.0–10.5)

## 2016-01-31 LAB — COMPREHENSIVE METABOLIC PANEL
ALBUMIN: 4.3 g/dL (ref 3.5–5.2)
ALT: 69 U/L — ABNORMAL HIGH (ref 0–53)
AST: 55 U/L — ABNORMAL HIGH (ref 0–37)
Alkaline Phosphatase: 49 U/L (ref 39–117)
BUN: 11 mg/dL (ref 6–23)
CALCIUM: 9.5 mg/dL (ref 8.4–10.5)
CHLORIDE: 103 meq/L (ref 96–112)
CO2: 29 meq/L (ref 19–32)
Creatinine, Ser: 1.09 mg/dL (ref 0.40–1.50)
GFR: 73.25 mL/min (ref >60.00)
Glucose, Bld: 94 mg/dL (ref 70–99)
Potassium: 4.2 mEq/L (ref 3.5–5.1)
Sodium: 137 mEq/L (ref 135–145)
Total Bilirubin: 0.5 mg/dL (ref 0.2–1.2)
Total Protein: 6.9 g/dL (ref 6.0–8.3)

## 2016-01-31 LAB — PSA: PSA: 4.75 ng/mL — ABNORMAL HIGH (ref 0.10–4.00)

## 2016-01-31 LAB — LIPID PANEL
CHOL/HDL RATIO: 4
CHOLESTEROL: 159 mg/dL (ref 0–200)
HDL: 40.7 mg/dL (ref >39.00)
LDL CALC: 86 mg/dL (ref 0–99)
NonHDL: 117.92
TRIGLYCERIDES: 160 mg/dL — AB (ref 0.0–149.0)
VLDL: 32 mg/dL (ref 0.0–40.0)

## 2016-01-31 MED ORDER — METHOTREXATE 2.5 MG PO TABS
ORAL_TABLET | ORAL | Status: DC
Start: 2016-01-31 — End: 2017-03-16

## 2016-01-31 MED ORDER — LEVOCETIRIZINE DIHYDROCHLORIDE 5 MG PO TABS: 5.0000 mg | ORAL_TABLET | Freq: Every evening | ORAL | Status: DC

## 2016-01-31 NOTE — Progress Notes (Signed)
Pre visit review using our clinic review tool, if applicable. No additional management support is needed unless otherwise documented below in the visit note. 

## 2016-01-31 NOTE — Progress Notes (Signed)
   Subjective:   Patient ID: Peter Rose, male    DOB: 1955/10/28, 60 y.o.   MRN: [id number]  Peter Rose is a pleasant 60 y.o. year old male who presents to clinic today with Follow-up; Allergies; and Medication Refill  on 01/31/2016  HPI:  I have not seen him since 09/2014.  Psoriasis- has been taking MTX prn for flares.    Still does not have health insurance so he is only taking it as needed.  Allergic rhinitis-  Takes Xyzal.  Has been a bad year for his allergies but feels he is coping ok.  No current outpatient prescriptions on file prior to visit.   No current facility-administered medications on file prior to visit.    Allergies  Allergen Reactions  . Codeine Other (See Comments)    Eels wirey and jittery  . Other     HYCODAN: Makes pt feel weird/jittery  . Vicodin [Hydrocodone-Acetaminophen]     Pt feels "wired" with pain meds    Past Medical History  Diagnosis Date  . Psoriasis     No past surgical history on file.  No family history on file.  Social History   Social History  . Marital Status: Married    Spouse Name: N/A  . Number of Children: N/A  . Years of Education: N/A   Occupational History  . Not on file.   Social History Main Topics  . Smoking status: Current Every Day Smoker  . Smokeless tobacco: Not on file  . Alcohol Use: Yes     Comment: occasional  . Drug Use: No  . Sexual Activity: Not on file   Other Topics Concern  . Not on file   Social History Narrative   The PMH, PSH, Social History, Family History, Medications, and allergies have been reviewed in [facility], and have been updated if relevant.   Review of Systems  Constitutional: Negative.   HENT: Positive for congestion, postnasal drip and rhinorrhea. Negative for sinus pressure.   Eyes: Negative.   Respiratory: Negative.   Cardiovascular: Negative.   Skin: Positive for rash.  All other systems reviewed and are negative.      Objective:    BP 124/78 mmHg  Pulse  63  Temp(Src) 98.1 F (36.7 C) (Oral)  Wt 213 lb (96.616 kg)  SpO2 96%   Physical Exam  Constitutional: He is oriented to person, place, and time. He appears well-developed and well-nourished. No distress.  HENT:  Head: Normocephalic and atraumatic.  Eyes: Conjunctivae are normal.  Cardiovascular: Normal rate and regular rhythm.   Pulmonary/Chest: Effort normal and breath sounds normal.  Musculoskeletal: Normal range of motion.  Neurological: He is alert and oriented to person, place, and time.  Skin: Rash noted. He is not diaphoretic.  Scaly plaque on left elbow  Psychiatric: He has a normal mood and affect. His behavior is normal. Thought content normal.  Nursing note and vitals reviewed.         Assessment & Plan:   Insomnia secondary to situational depression  Psoriasis - Plan: CBC with Differential/Platelet, Comprehensive metabolic panel  Tobacco abuse  Allergic rhinitis due to pollen  Screening for prostate cancer - Plan: PSA  Screening, lipid - Plan: Lipid panel No Follow-up on file.

## 2016-01-31 NOTE — Assessment & Plan Note (Signed)
Reasonably controlled with xyzal. eRx refilled.

## 2016-01-31 NOTE — Assessment & Plan Note (Signed)
Again discussed risks of MTX. Rx refilled today as he does not have health insurance. Check CBC and CMET. The patient indicates understanding of these issues and agrees with the plan.

## 2016-07-12 ENCOUNTER — Ambulatory Visit (INDEPENDENT_AMBULATORY_CARE_PROVIDER_SITE_OTHER): Payer: Self-pay | Admitting: Primary Care

## 2016-07-12 ENCOUNTER — Encounter: Payer: Self-pay | Admitting: Primary Care

## 2016-07-12 ENCOUNTER — Telehealth: Payer: Self-pay

## 2016-07-12 VITALS — BP 148/98 | HR 72 | Temp 98.2°F | Wt 206.8 lb

## 2016-07-12 DIAGNOSIS — J069 Acute upper respiratory infection, unspecified: Secondary | ICD-10-CM

## 2016-07-12 MED ORDER — BENZONATATE 200 MG PO CAPS: 200.0000 mg | ORAL_CAPSULE | Freq: Three times a day (TID) | ORAL | 0 refills | Status: DC | PRN

## 2016-07-12 MED ORDER — AZITHROMYCIN 250 MG PO TABS: ORAL_TABLET | ORAL | 0 refills | Status: DC

## 2016-07-12 NOTE — Progress Notes (Signed)
Subjective:    Patient ID: Peter Rose, male    DOB: [birth year], 60 y.o.   MRN: [id number]  HPI  Peter Rose is a 60 year old male with a history of allergic rhinitis who presents today with a chief complaint of cough. He also reports chest congestion, headache, fevers, sore throat, chills, headache. His symptoms have been present for the past 1 week. His cough is productive. He's taken Dayquil/Nyquil, Alka-seltzer, ibuprofen without much improvement. 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 His fever on Saturday last weekend was 101, no fever since. He's taking his methotrexate every 2 weeks. Overall he's feeling about the same, without improvement.  Review of Systems  Constitutional: Positive for chills and fatigue. Negative for fever.  HENT: Positive for congestion and sinus pressure. Negative for ear pain and sore throat.   Respiratory: Positive for cough and shortness of breath. Negative for wheezing.   Cardiovascular: Negative for chest pain.  Neurological: Positive for headaches.       Past Medical History:  Diagnosis Date  . Psoriasis      Social History   Social History  . Marital status: Married    Spouse name: N/A  . Number of children: N/A  . Years of education: N/A   Occupational History  . Not on file.   Social History Main Topics  . Smoking status: Current Every Day Smoker  . Smokeless tobacco: Not on file  . Alcohol use Yes     Comment: occasional  . Drug use: No  . Sexual activity: Not on file   Other Topics Concern  . Not on file   Social History Narrative  . No narrative on file    No past surgical history on file.  No family history on file.  Allergies  Allergen Reactions  . Codeine Other (See Comments)    Eels wirey and jittery  . Other     HYCODAN: Makes pt feel weird/jittery  . Vicodin [Hydrocodone-Acetaminophen]     Pt feels "wired" with pain meds    Current Outpatient Prescriptions on  File Prior to Visit  Medication Sig Dispense Refill  . methotrexate (RHEUMATREX) 2.5 MG tablet TAKE 10 TABLETS BY MOUTH EVERY WEEK 40 tablet 3  . levocetirizine (XYZAL) 5 MG tablet Take 1 tablet (5 mg total) by mouth every evening. (Patient not taking: Reported on 07/12/2016) 30 tablet 2   No current facility-administered medications on file prior to visit.     BP (!) 148/98   Pulse 72   Temp 98.2 F (36.8 C) (Oral)   Wt 206 lb 12.8 oz (93.8 kg)   SpO2 97%   BMI 30.54 kg/m    Objective:   Physical Exam  Constitutional: He appears well-nourished. He appears ill.  HENT:  Right Ear: Tympanic membrane and ear canal normal.  Left Ear: Tympanic membrane and ear canal normal.  Nose: No mucosal edema. Right sinus exhibits no maxillary sinus tenderness and no frontal sinus tenderness. Left sinus exhibits no maxillary sinus tenderness and no frontal sinus tenderness.  Mouth/Throat: Oropharynx is clear and moist.  Eyes: Conjunctivae are normal.  Neck: Neck supple.  Cardiovascular: Normal rate and regular rhythm.   Pulmonary/Chest: Effort normal. He has no decreased breath sounds. He has no wheezes. He has rhonchi in the right upper field, the left upper field, the left middle field and the left lower field. He has no rales.  Skin: Skin is warm and dry.  Assessment & Plan:  URI:  Cough, congestion, fatigue, headache x 1 week. Overall no improvement with OTC treatment. Exam today with mild-moderate rhonchi throughout lung fields, vitals stable, does appear acutely ill, not toxic. Given duration of symptoms, presentation, examination will treat for presumed initiation of bacterial involvement. Rx for Zpack and Tessalon Pearls sent to pharmacy. Restart Xyzal. Flonase. Fluids, rest, return precautions provided.  Sheral Flow, NP

## 2016-07-12 NOTE — Telephone Encounter (Signed)
Pt saw Allie Bossier NP 07/12/16 at 8 Am. FYI to Allie Bossier NP.

## 2016-07-12 NOTE — Progress Notes (Signed)
Pre visit review using our clinic review tool, if applicable. No additional management support is needed unless otherwise documented below in the visit note. 

## 2016-07-12 NOTE — Telephone Encounter (Signed)
PLEASE NOTE: All timestamps contained within this report are represented as [place] Standard Time. CONFIDENTIALTY NOTICE: This fax transmission is intended only for the addressee. It contains information that is legally privileged, confidential or otherwise protected from use or disclosure. If you are not the intended recipient, you are strictly prohibited from reviewing, disclosing, copying using or disseminating any of this information or taking any action in reliance on or regarding this information. If you have received this fax in error, please notify us immediately by telephone so that we can arrange for its return to [place]. Phone: [phone], Toll-Free: [phone], Fax: [phone] Page: 1 of 2 Call Id: [id number] [place] Patient Name: Peter Rose Gender: Male DOB: [birth year] Age: 60 Y 9 M 26 D Return Phone Number: [phone] (Primary) Address: City/State/Zip: [place] Client [place] - Client Client Site [place] Physician Arnette Norris - MD Contact Type Call Who Is Calling Patient / Member / Family / Caregiver Call Type Triage / Clinical Relationship To Patient Self Return Phone Number [phone] (Primary) Chief Complaint Headache Reason for Call Symptomatic / Request for Peter Rose states he has had issues since this past Friday. He believed it to be a sinus infection. He cannot get rid of his headache, coughing, and runny nose. [place] Not Listed Connect Now PreDisposition Call Doctor Translation No Nurse Assessment Nurse: Lovena Le, RN, Truman Hayward Date/Time (Eastern Time): 07/11/2016 6:24:29 PM Confirm and document reason for call. If symptomatic, describe symptoms. You must click the next button to save text entered. ---Caller states he has had issues since this past Friday.  He believed it to be a sinus infection. He cannot get rid of his headache, coughing, and runny nose. No thermometer. Feels hot. HX of sinus infections. No chest pain but does have congestion. Has the patient traveled out of the country within the last 30 days? ---Not Applicable Does the patient have any new or worsening symptoms? ---Yes Will a triage be completed? ---Yes Related visit to physician within the last 2 weeks? ---No Does the PT have any chronic conditions? (i.e. diabetes, asthma, etc.) ---No Is this a behavioral health or substance abuse call? ---No Guidelines Guideline Title Affirmed Question Affirmed Notes Nurse Date/Time Eilene Ghazi Time) Sinus Pain or Congestion [1] Redness or swelling on the cheek, forehead or around the eye AND [2] no fever Margarita Sermons 07/11/2016 6:29:25 PM Disp. Time Eilene Ghazi Time) Disposition Final User PLEASE NOTE: All timestamps contained within this report are represented as [place] Standard Time. CONFIDENTIALTY NOTICE: This fax transmission is intended only for the addressee. It contains information that is legally privileged, confidential or otherwise protected from use or disclosure. If you are not the intended recipient, you are strictly prohibited from reviewing, disclosing, copying using or disseminating any of this information or taking any action in reliance on or regarding this information. If you have received this fax in error, please notify us immediately by telephone so that we can arrange for its return to [place]. Phone: [phone], Toll-Free: [phone], Fax: [phone] Page: 2 of 2 Call Id: [id number] 07/11/2016 6:36:16 PM See Physician within 4 Hours (or PCP triage) Yes Lovena Le, RN, Windell Hummingbird Understands: Yes Disagree/Comply: Comply Care Advice Given Per Guideline SEE PHYSICIAN WITHIN 4 HOURS (or PCP triage): PAIN MEDICINES: * For pain relief, take acetaminophen, ibuprofen, or naproxen. CALL BACK IF: * You become worse. CARE  ADVICE given per Sinus Pain or Congestion (Adult) guideline. After Care Instructions Given Call Event Type User Date / Time Description Education document email Margarita Sermons 07/11/2016 6:35:01 PM [facility] Connect Now Instructions Comments User: Alger Simons, RN Date/Time Eilene Ghazi Time): 07/11/2016 6:29:11 PM on methyltrexate for psoriasis Referrals GO TO FACILITY OTHER - SPECIFY

## 2016-07-12 NOTE — Patient Instructions (Signed)
Start Azithromycin antibiotics. Take 2 tablets by mouth today, then 1 tablet daily for 4 additional days.  You may take Benzonatate capsules for cough. Take 1 capsule by mouth three times daily as needed for cough.  Restart your Xyzal tablets. Continue Ibuprofen for headaches. Take 600-800 mg three times daily as needed for pain.  Ensure you are staying hydrated with water. Get some rest.  It was a pleasure meeting you!

## 2016-07-12 NOTE — Telephone Encounter (Signed)
Noted and evaluated. 

## 2016-07-16 ENCOUNTER — Telehealth: Payer: Self-pay

## 2016-07-16 DIAGNOSIS — J069 Acute upper respiratory infection, unspecified: Secondary | ICD-10-CM

## 2016-07-16 NOTE — Telephone Encounter (Signed)
Pt states he finished with the zpak, he was seen last week and he's still having "tightness and HA behind the right eye" is there anything else that he can take?  [phone]

## 2016-07-16 NOTE — Telephone Encounter (Signed)
I recommend Flonase nasal spray. 1 spray into each nostril twice daily. He needs to restart his Xyzal if he's not yet done so.

## 2016-07-16 NOTE — Telephone Encounter (Signed)
Spoken and notified patient of Kate's comments. Patient verbalized understanding. 

## 2016-07-18 NOTE — Telephone Encounter (Signed)
These symptoms although frustrating can take time to improve.  I do agree with Kate's recommendations.  Is he taking anything for pain/inflammation?

## 2016-07-18 NOTE — Telephone Encounter (Addendum)
Pt still experiencing h/a behind both eyes and under cheek bones; most pain on rt side of head. zpak helped but pt is still having h/a. Pt is taking Xyzal and Flonase. Pt does not have fever and cough is better but cannot get rid of h/a. [facility] to Dr Deborra Medina. Pt was seen 07/12/16.

## 2016-07-18 NOTE — Telephone Encounter (Signed)
Pt has been taking OTC ibuprofen 200 mg tabs taking 4 tabs at a time and these help somewhat but pain worsens within 2 hours and pt takes 4 more ibuprofen.Pt states he is "taking ibuprofen like candy because they don't last." Pt said he is having trouble using the flonase because pt has never been able to squirt something in his nose.

## 2016-07-19 ENCOUNTER — Ambulatory Visit: Payer: Self-pay | Admitting: Family Medicine

## 2016-07-19 NOTE — Telephone Encounter (Addendum)
Can he come in today to be re evaluated?  I will hold the 1:15 slot for him (my only spot left) until we hear back from him but he can come in anytime before 1.

## 2016-07-19 NOTE — Telephone Encounter (Signed)
He states he wont get here in time, as he does not get off until 1545 and is traveling from [facility]

## 2016-07-19 NOTE — Telephone Encounter (Signed)
appt was scheduled. Pt was contacted and advised of appt time, but cancelled stating he was at work in [place] and would be unable to make that appt time

## 2016-07-19 NOTE — Telephone Encounter (Signed)
Can he come in to see one of my partners tomorrow?

## 2016-07-23 MED ORDER — AZITHROMYCIN 250 MG PO TABS: ORAL_TABLET | ORAL | 0 refills | Status: DC

## 2016-07-23 NOTE — Addendum Note (Signed)
Addended by: Lucille Passy on: 07/23/2016 06:12 PM   Modules accepted: Orders

## 2016-07-23 NOTE — Telephone Encounter (Signed)
There truly is nothing for headache other than OTC rxs.  I have to wonder if he still has a sinus infection and may benefit from either steroids or another round of antibiotics.  Have any of his symptoms improved?

## 2016-07-23 NOTE — Telephone Encounter (Signed)
We can certainly repeat course of abx but he needs to be evaluated, if not here than at an urgent care.  Those symptoms should not be ignored.  zpack eRx sent.

## 2016-07-23 NOTE — Telephone Encounter (Signed)
Spoke to pt and advised per DrAron. Pt states that his cough and sore throat have subsided, but the HA is still present on the R side of his head. He has been taking sinus medication and ibuprofen with little relief. Pt states he is unable to be seen again at this time, due to his work schedule.

## 2016-07-23 NOTE — Telephone Encounter (Signed)
Pt still has h/a and pressure on rt side; pt understands Dr Deborra Medina wants pt to come back in to be seen but pt said he cannot take off work to go to the doctors office and request med for h/a and pressure on rt side. Pt request cb.

## 2016-08-09 ENCOUNTER — Ambulatory Visit (INDEPENDENT_AMBULATORY_CARE_PROVIDER_SITE_OTHER): Payer: Self-pay | Admitting: Family Medicine

## 2016-08-09 ENCOUNTER — Encounter: Payer: Self-pay | Admitting: Family Medicine

## 2016-08-09 VITALS — BP 144/84 | HR 79 | Temp 98.2°F | Wt 210.5 lb

## 2016-08-09 DIAGNOSIS — J321 Chronic frontal sinusitis: Secondary | ICD-10-CM

## 2016-08-09 DIAGNOSIS — R945 Abnormal results of liver function studies: Secondary | ICD-10-CM

## 2016-08-09 DIAGNOSIS — G8929 Other chronic pain: Secondary | ICD-10-CM

## 2016-08-09 DIAGNOSIS — R7989 Other specified abnormal findings of blood chemistry: Secondary | ICD-10-CM

## 2016-08-09 DIAGNOSIS — R51 Headache: Secondary | ICD-10-CM

## 2016-08-09 MED ORDER — PREDNISONE 20 MG PO TABS: ORAL_TABLET | ORAL | 0 refills | Status: DC

## 2016-08-09 MED ORDER — AMOXICILLIN-POT CLAVULANATE 875-125 MG PO TABS: 1.0000 | ORAL_TABLET | Freq: Two times a day (BID) | ORAL | 0 refills | Status: DC

## 2016-08-09 MED ORDER — KETOROLAC TROMETHAMINE 60 MG/2ML IM SOLN
60.0000 mg | Freq: Once | INTRAMUSCULAR | Status: AC
  Administered 2016-08-09: 60 mg via INTRAMUSCULAR

## 2016-08-09 NOTE — Progress Notes (Signed)
Subjective:    Patient ID: Peter Rose, male    DOB: 12-06-1955, 60 y.o.   MRN: [id number]  HPI This is a 60 yo male who presents today with right sided head pain. He was seen 07/12/16 with URI symptoms. Has taken 2 Zpacks. Chest symptoms resolved- no cough, no wheeze, no SOB. No fevers. Occasionally wakes up hot. No nasal drainage. Some pain under right eye, but worse over right eye. He is tender over scalp. No ear pain or pressure, no sore throat. No visual change, double or blurred vision. No eye discharge. Appetite normal. No abdominal pain, no nausea, no changes in bowel or bladder. Pain 7/10, sometimes worse. Pain all the time, sometimes better or worse, unable to pinpoint what improves/worsens pain. Was out of work x 1 week due to symptoms.  Patient currently without health insurance, new plan takes effect next week.   Past Medical History:  Diagnosis Date  . Psoriasis    No past surgical history on file. No family history on file. Social History  Substance Use Topics  . Smoking status: Current Every Day Smoker  . Smokeless tobacco: Not on file  . Alcohol use Yes     Comment: occasional      Review of Systems Per HPI    Objective:   Physical Exam  Constitutional: He is oriented to person, place, and time. He appears well-developed and well-nourished. No distress.  Appears mildly uncomfortable.   HENT:  Head: Normocephalic and atraumatic.  Right Ear: Tympanic membrane, external ear and ear canal normal.  Left Ear: Tympanic membrane, external ear and ear canal normal.  Nose: Right sinus exhibits maxillary sinus tenderness and frontal sinus tenderness. Left sinus exhibits no maxillary sinus tenderness and no frontal sinus tenderness.  Mouth/Throat: Uvula is midline, oropharynx is clear and moist and mucous membranes are normal. No oropharyngeal exudate, posterior oropharyngeal edema or posterior oropharyngeal erythema.  Scalp without rash.   Eyes: Conjunctivae and EOM are  normal. Pupils are equal, round, and reactive to light. Right eye exhibits no discharge. Left eye exhibits no discharge. No scleral icterus.  Neck: Normal range of motion. Neck supple.  Cardiovascular: Normal rate, regular rhythm and normal heart sounds.   Pulmonary/Chest: Effort normal and breath sounds normal.  Musculoskeletal: Normal range of motion. He exhibits no edema.  Lymphadenopathy:    He has no cervical adenopathy.  Neurological: He is alert and oriented to person, place, and time.  Skin: Skin is warm and dry. He is not diaphoretic.  Psychiatric: He has a normal mood and affect. His behavior is normal. Thought content normal.  Vitals reviewed.     BP (!) 144/84 (BP Location: Left Arm, Patient Position: Sitting, Cuff Size: Normal)   Pulse 79   Temp 98.2 F (36.8 C) (Oral)   Wt 210 lb 8 oz (95.5 kg)   SpO2 94%   BMI 31.09 kg/m  Wt Readings from Last 3 Encounters:  08/09/16 210 lb 8 oz (95.5 kg)  07/12/16 206 lb 12.8 oz (93.8 kg)  01/31/16 213 lb (96.6 kg)       Assessment & Plan:  Discussed with Dr. Danise Mina 1. Chronic frontal sinusitis - not sure if this is residual will treat with different antibiotic class and prednisone, if no improvement, will need CT - amoxicillin-clavulanate (AUGMENTIN) 875-125 MG tablet; Take 1 tablet by mouth 2 (two) times daily.  Dispense: 14 tablet; Refill: 0 - predniSONE (DELTASONE) 20 MG tablet; Take 3 x 3 days, then 2 x  3 days, then 1 x 3 days  Dispense: 18 tablet; Refill: 0 - CBC with Differential/Platelet; Future - Comprehensive metabolic panel - Sedimentation rate  2. Chronic nonintractable headache, unspecified headache type - predniSONE (DELTASONE) 20 MG tablet; Take 3 x 3 days, then 2 x 3 days, then 1 x 3 days  Dispense: 18 tablet; Refill: 0 - ketorolac (TORADOL) injection 60 mg; Inject 2 mLs (60 mg total) into the muscle once. - CBC with Differential/Platelet; Future - Comprehensive metabolic panel - Sedimentation rate  3.  Elevated LFTs - Comprehensive metabolic panel   Clarene Reamer, FNP-BC  [facility], [place]  08/10/2016 8:13 AM

## 2016-08-09 NOTE — Progress Notes (Signed)
Pre visit review using our clinic review tool, if applicable. No additional management support is needed unless otherwise documented below in the visit note. 

## 2016-08-09 NOTE — Patient Instructions (Signed)
Please start your new medications today You will be notified of your lab results tomorrow Stop Sudafed for at least 2 days Can try Afrin nasal spray, 2 sprays in each nostril twice a day for a maximum of 3 days If your headache gets worse, please call the office or go to ER if after hours or on the weekend

## 2016-08-15 ENCOUNTER — Other Ambulatory Visit: Payer: Self-pay | Admitting: Family Medicine

## 2016-08-15 DIAGNOSIS — G8929 Other chronic pain: Secondary | ICD-10-CM

## 2016-08-15 DIAGNOSIS — R51 Headache: Secondary | ICD-10-CM

## 2016-08-15 DIAGNOSIS — J321 Chronic frontal sinusitis: Secondary | ICD-10-CM

## 2016-08-15 NOTE — Telephone Encounter (Signed)
Pt left v/m; pt was seen 08/09/16 and sinus were feeling better but today hurting on rt side. Pt is out of med and is on job site in [facility]. Pt request refill to [facility] and pt will have transferred to [facility]. Pt request cb.

## 2016-08-15 NOTE — Telephone Encounter (Signed)
Called to [facility] in [place], [place] where patient is on job site. He left his medication at home.

## 2016-08-16 NOTE — Telephone Encounter (Signed)
Pt notified med was called to pharmacy. Pt voiced understanding.

## 2016-08-20 ENCOUNTER — Telehealth: Payer: Self-pay

## 2016-08-20 ENCOUNTER — Encounter: Payer: Self-pay | Admitting: Emergency Medicine

## 2016-08-20 ENCOUNTER — Ambulatory Visit
Admission: EM | Admit: 2016-08-20 | Discharge: 2016-08-20 | Disposition: A | Discharge status: Home / Self Care | Payer: Commercial Managed Care - PPO | Attending: Family Medicine | Admitting: Family Medicine

## 2016-08-20 ENCOUNTER — Emergency Department
Admission: EM | Admit: 2016-08-20 | Discharge: 2016-08-20 | Disposition: A | Discharge status: Home / Self Care | Payer: Commercial Managed Care - PPO | Attending: Emergency Medicine | Admitting: Emergency Medicine

## 2016-08-20 ENCOUNTER — Emergency Department: Payer: Commercial Managed Care - PPO

## 2016-08-20 DIAGNOSIS — G44009 Cluster headache syndrome, unspecified, not intractable: Secondary | ICD-10-CM | POA: Insufficient documentation

## 2016-08-20 DIAGNOSIS — R519 Headache, unspecified: Secondary | ICD-10-CM

## 2016-08-20 DIAGNOSIS — R51 Headache: Secondary | ICD-10-CM

## 2016-08-20 DIAGNOSIS — F17200 Nicotine dependence, unspecified, uncomplicated: Secondary | ICD-10-CM | POA: Insufficient documentation

## 2016-08-20 DIAGNOSIS — Z8709 Personal history of other diseases of the respiratory system: Secondary | ICD-10-CM

## 2016-08-20 MED ORDER — KETOROLAC TROMETHAMINE 60 MG/2ML IM SOLN
60.0000 mg | Freq: Once | INTRAMUSCULAR | Status: AC
  Administered 2016-08-20: 60 mg via INTRAMUSCULAR

## 2016-08-20 MED ORDER — DIHYDROERGOTAMINE MESYLATE 4 MG/ML NA SOLN: NASAL | 1 refills | Status: DC

## 2016-08-20 MED ORDER — HYDROCODONE-ACETAMINOPHEN 5-325 MG PO TABS: 1.0000 | ORAL_TABLET | ORAL | 0 refills | Status: DC | PRN

## 2016-08-20 NOTE — ED Notes (Signed)
Pt advised by Dr Alveta Heimlich that he needed further evaluation at an ED, pt chose [facility] ED and also chose to drive hisself. Pt stable and ambulatory at time of departure.

## 2016-08-20 NOTE — ED Triage Notes (Signed)
Patient states that he is having sinus pain and pressure with headaches. Patient states that symptoms started 5 weeks ago. Patient states that he is currently on Amoxicillin and Prednisone, with no relief. Patient states that he is about to go out of town tomorrow.

## 2016-08-20 NOTE — ED Provider Notes (Signed)
[facility]    CSN: [id number] Arrival date & time: 08/20/16  1754     History   Chief Complaint Chief Complaint  Patient presents with  . Headache    Severe  . Facial Pain    HPI Peter Rose is a 60 y.o. male.   Patient is here because of headache. He reports a severe headache on the left side of the space involving the right eye frontal area and sinuses. He states he's never had a diagnosis of migraines before this headache is a 10 out of 10. Apparently for the last 5-6 weeks she's been treated for sinus infection and he is seen the PA at about clinic twice antecubital 2 different antibiotics and prednisone. He states seemed like the prednisone and antibiotics helps a little bit but then he keeps having these breakout headaches. This day the worst a states that even the hair of his head hurts. He denies any nausea no prodromal episode either. He does smoke he's had elevated PSA and he's had a history of psoriasis before. No previous surgeries or operations. No significant past family medical history pertinent to today's visit. He is allergic to codeine and hydrocodone.   The history is provided by the patient. No language interpreter was used.  Headache  Pain location:  R temporal and frontal Quality:  Stabbing and sharp Radiates to: to his scalp and the hairs on his scalp. Severity currently:  10/10 Severity at highest:  10/10 Onset quality:  Sudden Timing:  Constant Progression:  Unchanged Chronicity:  New Similar to prior headaches: no   Context: not activity, not exposure to bright light, not defecating, not eating and not stress   Relieved by:  Nothing Ineffective treatments:  Prescription medications (augmentin and prednisone has not worked) Associated symptoms: blurred vision and facial pain   Associated symptoms: no myalgias, no near-syncope, no neck pain, no neck stiffness, no paresthesias, no photophobia and no sore throat     Past Medical History:   Diagnosis Date  . Psoriasis     Patient Active Problem List   Diagnosis Date Noted  . Allergic rhinitis 01/31/2016  . Tobacco abuse 05/28/2012  . Elevated PSA 05/28/2012  . Psoriasis     Past Surgical History:  Procedure Laterality Date  . NO PAST SURGERIES         Home Medications    Prior to Admission medications   Medication Sig Start Date End Date Taking? Authorizing Provider  amoxicillin-clavulanate (AUGMENTIN) 875-125 MG tablet TAKE 1 TABLET BY MOUTH TWICE DAILY 08/15/16  Yes Elby Beck, FNP  levocetirizine (XYZAL) 5 MG tablet Take 1 tablet (5 mg total) by mouth every evening. 01/31/16  Yes Lucille Passy, MD  methotrexate (RHEUMATREX) 2.5 MG tablet TAKE 10 TABLETS BY MOUTH EVERY WEEK 01/31/16  Yes Lucille Passy, MD  predniSONE (DELTASONE) 20 MG tablet TAKE 3 TABLETS BY MOUTH FOR 3 DAYS, 2 TABLETS FOR 3 DAYS, THEN 1 TABLET FOR 3 DAYS. 08/15/16  Yes Elby Beck, FNP    Family History History reviewed. No pertinent family history.  Social History Social History  Substance Use Topics  . Smoking status: Current Every Day Smoker    Packs/day: 1.00  . Smokeless tobacco: Never Used  . Alcohol use Yes     Comment: occasional     Allergies   Codeine; Other; and Vicodin [hydrocodone-acetaminophen]   Review of Systems Review of Systems  HENT: Negative for sore throat.   Eyes: Positive for  blurred vision. Negative for photophobia.  Cardiovascular: Negative for near-syncope.  Musculoskeletal: Negative for myalgias, neck pain and neck stiffness.  Neurological: Positive for headaches. Negative for paresthesias.  All other systems reviewed and are negative.    Physical Exam Triage Vital Signs ED Triage Vitals  Enc Vitals Group     BP 08/20/16 1822 (!) 162/97     Pulse Rate 08/20/16 1822 80     Resp 08/20/16 1822 18     Temp 08/20/16 1822 98.7 F (37.1 C)     Temp Source 08/20/16 1822 Tympanic     SpO2 08/20/16 1822 97 %     Weight 08/20/16 1820 208  lb (94.3 kg)     Height 08/20/16 1820 5\' 10"  (1.778 m)     Head Circumference --      Peak Flow --      Pain Score 08/20/16 1823 10     Pain Loc --      Pain Edu? --      Excl. in [place]? --    No data found.   Updated Vital Signs BP (!) 162/97 (BP Location: Left Arm)   Pulse 80   Temp 98.7 F (37.1 C) (Tympanic)   Resp 18   Ht 5\' 10"  (1.778 m)   Wt 208 lb (94.3 kg)   SpO2 97%   BMI 29.84 kg/m   Visual Acuity Right Eye Distance:   Left Eye Distance:   Bilateral Distance:    Right Eye Near:   Left Eye Near:    Bilateral Near:     Physical Exam  Constitutional: He is oriented to person, place, and time. He appears well-developed and well-nourished.  HENT:  Head: Normocephalic.  Right Ear: External ear normal.  Left Ear: External ear normal.  Mouth/Throat: Oropharynx is clear and moist.  Eyes: EOM are normal. Pupils are equal, round, and reactive to light.  Neck: Normal range of motion. Neck supple.  Pulmonary/Chest: Effort normal.  Musculoskeletal: Normal range of motion. He exhibits no edema or deformity.  Neurological: He is alert and oriented to person, place, and time. No cranial nerve deficit.  Skin: Skin is warm.  Psychiatric: He has a normal mood and affect. His behavior is normal.  Vitals reviewed.    UC Treatments / Results  Labs (all labs ordered are listed, but only abnormal results are displayed) Labs Reviewed - No data to display  EKG  EKG Interpretation None       Radiology No results found.  Procedures Procedures (including critical care time)  Medications Ordered in UC Medications  ketorolac (TORADOL) injection 60 mg (60 mg Intramuscular Given 08/20/16 1857)     Initial Impression / Assessment and Plan / UC Course  I have reviewed the triage vital signs and the nursing notes.  Pertinent labs & imaging results that were available during my care of the patient were reviewed by me and considered in my medical decision making (see  chart for details).  Clinical Course    Patient headache concerned this may be a cluster headache. Explained to patient is a cluster headache she'll need O2 and possible narcotics Dilaudid and Percocet for pain. However need to make sure there is not an aneurysm or tumor present since his headache is so atypical. Explained to him he's been treated for sinus infection twice without improvement in with a recurrence of his headaches feel that there be a level insanity to continue on the same treatment regimen without making sure there is  nothing else wrong. He's agreed and because we do not have CT scan here he's well-developed ago to [facility] to be seen and evaluated.   Final Clinical Impressions(s) / UC Diagnoses   Final diagnoses:  Headache disorder  Cluster headache, not intractable, unspecified chronicity pattern  Hx of acute sinusitis    New Prescriptions Discharge Medication List as of 08/20/2016  6:56 PM     I have discussed with Raquel at [facility] ED charge nurse of patient's and it arrival. And because of the mild pain he was having 60 Toradol IM was given and explained to him that that's normally I would wait for the CT scan consistent having pain off and on for the last for 5 weeks and is so miserable we'll give him the one time Toradol injection this time.    Note: This dictation was prepared with Dragon dictation along with smaller phrase technology. Any transcriptional errors that result from this process are unintentional.     Frederich Cha, MD 08/20/16 1920

## 2016-08-20 NOTE — Telephone Encounter (Signed)
Spoke to pt and advised meds were sent on 11/1. Advised that per Dr Deborra Medina, he would have to be seen, if wanting another round of antibiotics and prednisone

## 2016-08-20 NOTE — Telephone Encounter (Signed)
Pt called is requesting a call back about the antibiotic and prednisone.   [id number].

## 2016-08-20 NOTE — ED Triage Notes (Signed)
Pt ambulatory to triage, grimacing, holding head, face flushed; pt reports sent from [facility] for CT scan; c/o intermittent right sided HA x 5 weeks; completed round of steroids and antibiotics without relief

## 2016-08-20 NOTE — ED Notes (Signed)
Dr Karma Greaser placed pt on a Non rebreather in attempt to relieve pt's headache.

## 2016-08-20 NOTE — ED Notes (Signed)
Pt has a headache that is light sensitive. Lights dimmed.

## 2016-08-20 NOTE — ED Notes (Signed)
Discharge instructions reviewed with patient. Questions fielded by this RN. Patient verbalizes understanding of instructions. Patient discharged home in stable condition per [place] MD . No acute distress noted at time of discharge.   

## 2016-08-20 NOTE — Discharge Instructions (Signed)
As we discussed, your signs and symptoms are most consistent with a cluster headache.  Please read through the included information.  Your CT scan showed that you do not have a significant amount of sinus infection.  Please try the prescribed nose spray, but if it is too expensive to try, that we suggest you try the scribe pain medication but you will need to follow up with your regular doctor either way at the next available opportunity to discuss long-term cluster headache management.  Sometimes people need sleep studies and a breathing machine at night and this can be quite effective in treating the headache.  Return to the emergency department if you develop new or worsening symptoms that concern you.

## 2016-08-20 NOTE — ED Provider Notes (Signed)
[facility] Provider Note  ____________________________________________   First MD Initiated Contact with Patient 08/20/16 2052     (approximate)  I have reviewed the triage vital signs and the nursing notes.   HISTORY  Chief Complaint Headache    HPI Peter Rose is a 60 y.o. male with no significant past medical history and who is quite healthy and active at baseline.  He presents for evaluation of an intermittent but recurrent right-sided headache for the last 5 weeks.  He was originally treated for sinusitis but the symptoms are persistent in spite of treatment.  He was seen at the urgent care earlier today and recommended to come to the emergency department for further evaluation including a head CT.  He reports that the pain is severe at times, currently moderate, and involves the right side of his head and face including pain around his eye which sometimes weeps.  He is tender to palpation of the right side of his face and the right side of his scalp.  He reports that nothing makes it better and nothing makes it worse except that sometimes he is sensitive to light.  He denies any weakness or numbness in any of his extremities.  He has not had any gait instability.  He denies nausea, vomiting, chest pain, shortness of breath, fever/chills.  He has had no visual changes.  Past Medical History:  Diagnosis Date  . Psoriasis     Patient Active Problem List   Diagnosis Date Noted  . Allergic rhinitis 01/31/2016  . Tobacco abuse 05/28/2012  . Elevated PSA 05/28/2012  . Psoriasis     Past Surgical History:  Procedure Laterality Date  . NO PAST SURGERIES      Prior to Admission medications   Medication Sig Start Date End Date Taking? Authorizing Provider  amoxicillin-clavulanate (AUGMENTIN) 875-125 MG tablet TAKE 1 TABLET BY MOUTH TWICE DAILY 08/15/16   Elby Beck, FNP  dihydroergotamine (MIGRANAL) 4 MG/ML nasal spray Use  one spray in each nostril.  Wait 15 minutes and administer another spray into each nostril for a total of four.  Do not repeat more than once a day. 08/20/16   Hinda Kehr, MD  HYDROcodone-acetaminophen (NORCO/VICODIN) 5-325 MG tablet Take 1-2 tablets by mouth every 4 (four) hours as needed for moderate pain. 08/20/16   Hinda Kehr, MD  levocetirizine (XYZAL) 5 MG tablet Take 1 tablet (5 mg total) by mouth every evening. 01/31/16   Lucille Passy, MD  methotrexate (RHEUMATREX) 2.5 MG tablet TAKE 10 TABLETS BY MOUTH EVERY WEEK 01/31/16   Lucille Passy, MD  predniSONE (DELTASONE) 20 MG tablet TAKE 3 TABLETS BY MOUTH FOR 3 DAYS, 2 TABLETS FOR 3 DAYS, THEN 1 TABLET FOR 3 DAYS. 08/15/16   Elby Beck, FNP    Allergies Codeine; Other; and Vicodin [hydrocodone-acetaminophen]  No family history on file.  Social History Social History  Substance Use Topics  . Smoking status: Current Every Day Smoker    Packs/day: 1.00  . Smokeless tobacco: Never Used  . Alcohol use Yes     Comment: occasional    Review of Systems Constitutional: No fever/chills Eyes: No visual changes. ENT: No sore throat. Cardiovascular: Denies chest pain. Respiratory: Denies shortness of breath. Gastrointestinal: No abdominal pain.  No nausea, no vomiting.  No diarrhea.  No constipation. Genitourinary: Negative for dysuria. Musculoskeletal: Negative for back pain. Skin: Negative for rash. Neurological: Right-sided headache and facial pain intermittent over the last 5  weeks  10-point ROS otherwise negative.  ____________________________________________   PHYSICAL EXAM:  VITAL SIGNS: ED Triage Vitals  Enc Vitals Group     BP 08/20/16 1925 (!) 162/96     Pulse Rate 08/20/16 1925 91     Resp 08/20/16 1925 20     Temp 08/20/16 1925 98 F (36.7 C)     Temp Source 08/20/16 1925 Oral     SpO2 08/20/16 1925 95 %     Weight 08/20/16 1925 208 lb (94.3 kg)     Height 08/20/16 1925 5\' 10"  (1.778 m)     Head  Circumference --      Peak Flow --      Pain Score 08/20/16 1926 10     Pain Loc --      Pain Edu? --      Excl. in [place]? --     Constitutional: Alert and oriented. Well appearing and in no acute distress. Eyes: Conjunctivae are normal. PERRL. EOMI. No papilledema on funduscopic exam. Head: Atraumatic. No temporal tenderness. Nose: No congestion/rhinnorhea. Mouth/Throat: Mucous membranes are moist.  Oropharynx non-erythematous. Neck: No stridor.  No meningeal signs.   Cardiovascular: Normal rate, regular rhythm. Good peripheral circulation. Grossly normal heart sounds. Respiratory: Normal respiratory effort.  No retractions. Lungs CTAB. Gastrointestinal: Soft and nontender. No distention.  Musculoskeletal: No lower extremity tenderness nor edema. No gross deformities of extremities. Neurologic:  Normal speech and language. No gross focal neurologic deficits are appreciated.   Skin:  Skin is warm, dry and intact. No rash noted. Psychiatric: Mood and affect are normal. Speech and behavior are normal.  ____________________________________________   LABS (all labs ordered are listed, but only abnormal results are displayed)  Labs Reviewed - No data to display ____________________________________________  EKG  None - EKG not ordered by ED physician ____________________________________________  RADIOLOGY   Ct Head Wo Contrast  Result Date: 08/20/2016 CLINICAL DATA:  Intermittent right side headache for 5 weeks. No known injury. EXAM: CT HEAD WITHOUT CONTRAST TECHNIQUE: Contiguous axial images were obtained from the base of the skull through the vertex without intravenous contrast. COMPARISON:  None. FINDINGS: Brain: No acute abnormality including hemorrhage, infarct, mass lesion, mass effect, midline shift or abnormal extra-axial fluid collection. No hydrocephalus or pneumocephalus. Vascular: Mild carotid atherosclerosis is noted. Skull: Normal. Negative for fracture or focal lesion.  Sinuses/Orbits: No acute finding. Other: None. IMPRESSION: No acute abnormality. Mild carotid atherosclerosis. Electronically Signed   By: Inge Rise M.D.   On: 08/20/2016 20:07    ____________________________________________   PROCEDURES  Procedure(s) performed:   Procedures   Critical Care performed: No ____________________________________________   INITIAL IMPRESSION / ASSESSMENT AND PLAN / ED COURSE  Pertinent labs & imaging results that were available during my care of the patient were reviewed by me and considered in my medical decision making (see chart for details).  The patient's signs, symptoms, and history of present illness are consistent with cluster headache.  I am trying him on 15 L of oxygen on a nonrebreather to see if this helps.  I will then reassess.   Clinical Course as of Aug 20 2232  [facility] Aug 20, 2016  2139 Patient does feel noticeable relief after O2 on NRB.  Strongly suspect cluster headache.  Reviewed Dr. Wille Glaser note from Urgent Care and he felt the same.  I will prescribe the patient nasal dihydroergotamine and some Norco and advised close outpatient follow up.  I gave my usual and customary return precautions.   [  CF]    Clinical Course User Index [CF] Hinda Kehr, MD    ____________________________________________  FINAL CLINICAL IMPRESSION(S) / ED DIAGNOSES  Final diagnoses:  Cluster headache, not intractable, unspecified chronicity pattern     MEDICATIONS GIVEN DURING THIS VISIT:  Medications - No data to display   NEW OUTPATIENT MEDICATIONS STARTED DURING THIS VISIT:  Discharge Medication List as of 08/20/2016  9:53 PM    START taking these medications   Details  dihydroergotamine (MIGRANAL) 4 MG/ML nasal spray Use one spray in each nostril.  Wait 15 minutes and administer another spray into each nostril for a total of four.  Do not repeat more than once a day., Print    HYDROcodone-acetaminophen (NORCO/VICODIN) 5-325 MG  tablet Take 1-2 tablets by mouth every 4 (four) hours as needed for moderate pain., Starting Mon 08/20/2016, Print        Discharge Medication List as of 08/20/2016  9:53 PM      Discharge Medication List as of 08/20/2016  9:53 PM       Note:  This document was prepared using Dragon voice recognition software and may include unintentional dictation errors.    Hinda Kehr, MD 08/20/16 2233

## 2016-09-03 ENCOUNTER — Ambulatory Visit (INDEPENDENT_AMBULATORY_CARE_PROVIDER_SITE_OTHER): Payer: Commercial Managed Care - PPO | Admitting: Family Medicine

## 2016-09-03 ENCOUNTER — Encounter: Payer: Self-pay | Admitting: Family Medicine

## 2016-09-03 VITALS — BP 136/84 | HR 70 | Temp 98.0°F | Wt 210.5 lb

## 2016-09-03 DIAGNOSIS — M5481 Occipital neuralgia: Secondary | ICD-10-CM

## 2016-09-03 DIAGNOSIS — R51 Headache: Secondary | ICD-10-CM | POA: Diagnosis not present

## 2016-09-03 DIAGNOSIS — J321 Chronic frontal sinusitis: Secondary | ICD-10-CM | POA: Diagnosis not present

## 2016-09-03 DIAGNOSIS — R519 Headache, unspecified: Secondary | ICD-10-CM | POA: Insufficient documentation

## 2016-09-03 DIAGNOSIS — G8929 Other chronic pain: Secondary | ICD-10-CM

## 2016-09-03 DIAGNOSIS — G44019 Episodic cluster headache, not intractable: Secondary | ICD-10-CM | POA: Diagnosis not present

## 2016-09-03 MED ORDER — VARENICLINE TARTRATE 0.5 MG X 11 & 1 MG X 42 PO MISC: ORAL | 0 refills | Status: DC

## 2016-09-03 MED ORDER — SILDENAFIL CITRATE 100 MG PO TABS: ORAL_TABLET | ORAL | 0 refills | Status: DC

## 2016-09-03 MED ORDER — PREDNISONE 20 MG PO TABS: ORAL_TABLET | ORAL | 0 refills | Status: DC

## 2016-09-03 NOTE — Patient Instructions (Addendum)
Occipital Neuralgia Occipital neuralgia is a type of headache that causes episodes of very bad pain in the back of your head. Pain from occipital neuralgia may spread (radiate) to other parts of your head. The pain is usually brief and often goes away after you rest and relax. These headaches may be caused by irritation of the nerves that leave your spinal cord high up in your neck, just below the base of your skull (occipital nerves). Your occipital nerves transmit sensations from the back of your head, the top of your head, and the areas behind your ears. CAUSES Occipital neuralgia can occur without any known cause (primary headache syndrome). In other cases, occipital neuralgia is caused by pressure on or irritation of one of the two occipital nerves. Causes of occipital nerve compression or irritation include:  Wear and tear of the vertebrae in the neck (osteoarthritis).  Neck injury.  Disease of the disks that separate the vertebrae.  Tumors.  Gout.  Infections.  Diabetes.  Swollen blood vessels that put pressure on the occipital nerves.  Muscle spasm in the neck. SIGNS AND SYMPTOMS Pain is the main symptom of occipital neuralgia. It usually starts in the back of the head but may also be felt in other areas supplied by the occipital nerves. Pain is usually on one side but may be on both sides. You may have:   Brief episodes of very bad pain that is burning, stabbing, shocking, or shooting.  Pain behind the eye.  Pain triggered by neck movement or hair brushing.  Scalp tenderness.  Aching in the back of the head between episodes of very bad pain. DIAGNOSIS  Your health care provider may diagnose occipital neuralgia based on your symptoms and a physical exam. During the exam, the health care provider may push on areas supplied by the occipital nerves to see if they are painful. Some tests may also be done to help in making the diagnosis. These may include:  Imaging studies of  the upper spinal cord, such as an MRI or CT scan. These may show compression or spinal cord abnormalities.  Nerve block. You will get an injection of numbing medicine (local anesthetic) near the occipital nerve to see if this relieves pain. TREATMENT  Treatment may begin with simple measures, such as:   Rest.  Massage.  Heat.  Over-the-counter pain relievers. If these measures do not work, you may need other treatments, including:  Medicines such as:  Prescription-strength anti-inflammatory medicines.  Muscle relaxants.  Antiseizure medicines.  Antidepressants.  Steroid injection. This involves injections of local anesthetic and strong anti-inflammatory drugs (steroids).  Pulsed radiofrequency. Wires are implanted to deliver electrical impulses that block pain signals from the occipital nerve.  Physical therapy.  Surgery to relieve nerve pressure. HOME CARE INSTRUCTIONS  Take all medicines as directed by your health care provider.  Avoid activities that cause pain.  Rest when you have an attack of pain.  Try gentle massage or a heating pad to relieve pain.  Work with a physical therapist to learn stretching exercises you can do at home.  Try a different pillow or sleeping position.  Practice good posture.  Try to stay active. Get regular exercise that does not cause pain. Ask your health care provider to suggest safe exercises for you.  Keep all follow-up visits as directed by your health care provider. This is important. SEEK MEDICAL CARE IF:  Your medicine is not working.  You have new or worsening symptoms. SEEK IMMEDIATE MEDICAL CARE   IF:  You have very bad head pain that is not going away.  You have a sudden change in vision, balance, or speech. MAKE SURE YOU:  Understand these instructions.  Will watch your condition.  Will get help right away if you are not doing well or get worse. This information is not intended to replace advice given to  you by your health care provider. Make sure you discuss any questions you have with your health care provider. Document Released: 09/25/2001 Document Revised: 10/22/2014 Document Reviewed: 09/23/2013 Elsevier Interactive Patient Education  2017 [place] to see you. Please stop by to see Rosaria Ferries on your way out.

## 2016-09-03 NOTE — Progress Notes (Signed)
Pre visit review using our clinic review tool, if applicable. No additional management support is needed unless otherwise documented below in the visit note. 

## 2016-09-03 NOTE — Progress Notes (Signed)
Subjective:   Patient ID: Peter Rose, male    DOB: 12-Aug-1956, 60 y.o.   MRN: [id number]  [place] Schwindt is a pleasant 60 y.o. year old male who presents to clinic today with Hospitalization Follow-up and Headache  on 09/03/2016  HPI:  Presented to [facility] on 08/20/16 for 5 weeks of recurrent right sided headaches.  Notes reviewed.  Initially went to urgent care earlier that day and they recommended he go to the ER for CT scan.  He denies any focal neurological changes, nausea, vomiting or visual changes.  Previously saw Allie Bossier when headaches started- felt it was sinusitis- given zpack- note reviewed on 06/11/16. Then returned on 08/10/2016- note reviewed.  Saw Tor Netters- given Augmentin and prednisone. He did have sinus pressure, runny nose and sore throat when he saw them. Headache changes after his other symptoms resolved- very sharp, 15/10, right sided, head is painful to touch, hurts to brush hair when it happens.  He does not think pain meds help much because of how sharp and quick these headaches occur.  No tearing from one eye or nasal discharge from one nare.  Head CT neg.  Diagnosed with cluster headaches- given OX per non rebreather- did  feel noticeable relief.   Prescribed nasal dihydroergotamine and some Norco and to follow up with me. Ct Head Wo Contrast  Result Date: 08/20/2016 CLINICAL DATA:  Intermittent right side headache for 5 weeks. No known injury. EXAM: CT HEAD WITHOUT CONTRAST TECHNIQUE: Contiguous axial images were obtained from the base of the skull through the vertex without intravenous contrast. COMPARISON:  None. FINDINGS: Brain: No acute abnormality including hemorrhage, infarct, mass lesion, mass effect, midline shift or abnormal extra-axial fluid collection. No hydrocephalus or pneumocephalus. Vascular: Mild carotid atherosclerosis is noted. Skull: Normal. Negative for fracture or focal lesion. Sinuses/Orbits: No acute finding. Other: None.  IMPRESSION: No acute abnormality. Mild carotid atherosclerosis. Electronically Signed   By: Inge Rise M.D.   On: 08/20/2016 20:07     Current Outpatient Prescriptions on File Prior to Visit  Medication Sig Dispense Refill  . dihydroergotamine (MIGRANAL) 4 MG/ML nasal spray Use one spray in each nostril.  Wait 15 minutes and administer another spray into each nostril for a total of four.  Do not repeat more than once a day. 8 mL 1  . HYDROcodone-acetaminophen (NORCO/VICODIN) 5-325 MG tablet Take 1-2 tablets by mouth every 4 (four) hours as needed for moderate pain. 15 tablet 0  . levocetirizine (XYZAL) 5 MG tablet Take 1 tablet (5 mg total) by mouth every evening. 30 tablet 2  . methotrexate (RHEUMATREX) 2.5 MG tablet TAKE 10 TABLETS BY MOUTH EVERY WEEK 40 tablet 3   No current facility-administered medications on file prior to visit.     Allergies  Allergen Reactions  . Codeine Other (See Comments)    Eels wirey and jittery  . Other     HYCODAN: Makes pt feel weird/jittery  . Vicodin [Hydrocodone-Acetaminophen]     Pt feels "wired" with pain meds    Past Medical History:  Diagnosis Date  . Psoriasis     Past Surgical History:  Procedure Laterality Date  . NO PAST SURGERIES      No family history on file.  Social History   Social History  . Marital status: Married    Spouse name: N/A  . Number of children: N/A  . Years of education: N/A   Occupational History  . Not on file.   Social History Main  Topics  . Smoking status: Current Every Day Smoker    Packs/day: 1.00  . Smokeless tobacco: Never Used  . Alcohol use Yes     Comment: occasional  . Drug use: No  . Sexual activity: Not on file   Other Topics Concern  . Not on file   Social History Narrative  . No narrative on file   The PMH, PSH, Social History, Family History, Medications, and allergies have been reviewed in [facility], and have been updated if relevant.   Review of Systems  Constitutional:  Negative.   HENT: Negative.   Eyes: Negative.   Gastrointestinal: Negative.   Skin: Negative.   Neurological: Positive for headaches. Negative for dizziness, tremors, seizures, syncope, facial asymmetry, speech difficulty, weakness, light-headedness and numbness.  Hematological: Negative.   Psychiatric/Behavioral: Negative.   All other systems reviewed and are negative.      Objective:    BP 136/84   Pulse 70   Temp 98 F (36.7 C) (Oral)   Wt 210 lb 8 oz (95.5 kg)   SpO2 97%   BMI 30.20 kg/m    Physical Exam  Constitutional: He is oriented to person, place, and time. He appears well-developed and well-nourished. No distress.  HENT:  Head: Atraumatic.  Eyes: Conjunctivae are normal.  Cardiovascular: Normal rate.   Pulmonary/Chest: Effort normal.  Musculoskeletal: Normal range of motion.  Neurological: He is alert and oriented to person, place, and time. No cranial nerve deficit. Coordination normal.  Skin: Skin is warm and dry. He is not diaphoretic.  Psychiatric: He has a normal mood and affect. His behavior is normal. Judgment and thought content normal.  Nursing note and vitals reviewed.         Assessment & Plan:   Episodic cluster headache, not intractable No Follow-up on file.

## 2016-09-03 NOTE — Assessment & Plan Note (Signed)
>  25 minutes spent in face to face time with patient, >50% spent in counselling or coordination of care I am not sure he has cluster headaches here.  This is presenting more like an initial sinus infection and now perhaps occipital neuralgia. Will place on course of prednisone and refer to neurology for evaluation and possible nerve block. The patient indicates understanding of these issues and agrees with the plan.

## 2016-09-04 ENCOUNTER — Telehealth: Payer: Self-pay

## 2016-09-04 NOTE — Telephone Encounter (Signed)
Pt request status of viagra refill; advised pt was sent electronically on 09/03/16 to [facility]/ pt voiced understanding and will ck with pharmacy. Pt also wanted status of referral to neurologist; Rosaria Ferries [facility] said referral done and neurology office will contact pt . Pt voiced understanding.

## 2016-09-10 ENCOUNTER — Telehealth: Payer: Self-pay

## 2016-09-10 NOTE — Telephone Encounter (Signed)
Pt left v/m requesting prior auth for chantix.

## 2016-09-10 NOTE — Telephone Encounter (Signed)
Will await notification indicating PA required.

## 2016-09-11 ENCOUNTER — Ambulatory Visit (INDEPENDENT_AMBULATORY_CARE_PROVIDER_SITE_OTHER): Payer: Commercial Managed Care - PPO | Admitting: Neurology

## 2016-09-11 ENCOUNTER — Encounter: Payer: Self-pay | Admitting: *Deleted

## 2016-09-11 ENCOUNTER — Encounter: Payer: Self-pay | Admitting: Neurology

## 2016-09-11 VITALS — BP 131/87 | HR 64 | Ht 70.0 in | Wt 210.2 lb

## 2016-09-11 DIAGNOSIS — M792 Neuralgia and neuritis, unspecified: Secondary | ICD-10-CM

## 2016-09-11 DIAGNOSIS — M5481 Occipital neuralgia: Secondary | ICD-10-CM | POA: Diagnosis not present

## 2016-09-11 DIAGNOSIS — R51 Headache: Secondary | ICD-10-CM | POA: Diagnosis not present

## 2016-09-11 DIAGNOSIS — M316 Other giant cell arteritis: Secondary | ICD-10-CM

## 2016-09-11 DIAGNOSIS — G8929 Other chronic pain: Secondary | ICD-10-CM

## 2016-09-11 DIAGNOSIS — R519 Headache, unspecified: Secondary | ICD-10-CM

## 2016-09-11 MED ORDER — GABAPENTIN 300 MG PO CAPS: 300.0000 mg | ORAL_CAPSULE | Freq: Three times a day (TID) | ORAL | 11 refills | Status: DC

## 2016-09-11 NOTE — Patient Instructions (Addendum)
Remember to drink plenty of fluid, eat healthy meals and do not skip any meals. Try to eat protein with a every meal and eat a healthy snack such as fruit or nuts in between meals. Try to keep a regular sleep-wake schedule and try to exercise daily, particularly in the form of walking, 20-30 minutes a day, if you can.   As far as your medications are concerned, I would like to suggest: Neurontin 300mg  three times a day  As far as diagnostic testing: Labs  I would like to see you back as needed, sooner if we need to. Please call us with any interim questions, concerns, problems, updates or refill requests.   Our phone number is [phone]. We also have an after hours call service for urgent matters and there is a physician on-call for urgent questions. For any emergencies you know to call 911 or go to the nearest emergency room  Gabapentin capsules or tablets What is this medicine? GABAPENTIN (GA ba pen tin) is used to control partial seizures in adults with epilepsy. It is also used to treat certain types of nerve pain. This medicine may be used for other purposes; ask your health care provider or pharmacist if you have questions. COMMON BRAND NAME(S): Active-PAC with Gabapentin, Gabarone, Neurontin What should I tell my health care provider before I take this medicine? They need to know if you have any of these conditions: -kidney disease -suicidal thoughts, plans, or attempt; a previous suicide attempt by you or a family member -an unusual or allergic reaction to gabapentin, other medicines, foods, dyes, or preservatives -pregnant or trying to get pregnant -breast-feeding How should I use this medicine? Take this medicine by mouth with a glass of water. Follow the directions on the prescription label. You can take it with or without food. If it upsets your stomach, take it with food.Take your medicine at regular intervals. Do not take it more often than directed. Do not stop taking except  on your doctor's advice. If you are directed to break the 600 or 800 mg tablets in half as part of your dose, the extra half tablet should be used for the next dose. If you have not used the extra half tablet within 28 days, it should be thrown away. A special MedGuide will be given to you by the pharmacist with each prescription and refill. Be sure to read this information carefully each time. Talk to your pediatrician regarding the use of this medicine in children. Special care may be needed. Overdosage: If you think you have taken too much of this medicine contact a poison control center or emergency room at once. NOTE: This medicine is only for you. Do not share this medicine with others. What if I miss a dose? If you miss a dose, take it as soon as you can. If it is almost time for your next dose, take only that dose. Do not take double or extra doses. What may interact with this medicine? Do not take this medicine with any of the following medications: -other gabapentin products This medicine may also interact with the following medications: -alcohol -antacids -antihistamines for allergy, cough and cold -certain medicines for anxiety or sleep -certain medicines for depression or psychotic disturbances -homatropine; hydrocodone -naproxen -narcotic medicines (opiates) for pain -phenothiazines like chlorpromazine, mesoridazine, prochlorperazine, thioridazine This list may not describe all possible interactions. Give your health care provider a list of all the medicines, herbs, non-prescription drugs, or dietary supplements you use. Also tell  them if you smoke, drink alcohol, or use illegal drugs. Some items may interact with your medicine. What should I watch for while using this medicine? Visit your doctor or health care professional for regular checks on your progress. You may want to keep a record at home of how you feel your condition is responding to treatment. You may want to share  this information with your doctor or health care professional at each visit. You should contact your doctor or health care professional if your seizures get worse or if you have any new types of seizures. Do not stop taking this medicine or any of your seizure medicines unless instructed by your doctor or health care professional. Stopping your medicine suddenly can increase your seizures or their severity. Wear a medical identification bracelet or chain if you are taking this medicine for seizures, and carry a card that lists all your medications. You may get drowsy, dizzy, or have blurred vision. Do not drive, use machinery, or do anything that needs mental alertness until you know how this medicine affects you. To reduce dizzy or fainting spells, do not sit or stand up quickly, especially if you are an older patient. Alcohol can increase drowsiness and dizziness. Avoid alcoholic drinks. Your mouth may get dry. Chewing sugarless gum or sucking hard candy, and drinking plenty of water will help. The use of this medicine may increase the chance of suicidal thoughts or actions. Pay special attention to how you are responding while on this medicine. Any worsening of mood, or thoughts of suicide or dying should be reported to your health care professional right away. Women who become pregnant while using this medicine may enroll in the [place] Pregnancy Registry by calling [phone]. This registry collects information about the safety of antiepileptic drug use during pregnancy. What side effects may I notice from receiving this medicine? Side effects that you should report to your doctor or health care professional as soon as possible: -allergic reactions like skin rash, itching or hives, swelling of the face, lips, or tongue -worsening of mood, thoughts or actions of suicide or dying Side effects that usually do not require medical attention (report to your doctor or health  care professional if they continue or are bothersome): -constipation -difficulty walking or controlling muscle movements -dizziness -nausea -slurred speech -tiredness -tremors -weight gain This list may not describe all possible side effects. Call your doctor for medical advice about side effects. You may report side effects to FDA at 1-800-FDA-1088. Where should I keep my medicine? Keep out of reach of children. This medicine may cause accidental overdose and death if it taken by other adults, children, or pets. Mix any unused medicine with a substance like cat litter or coffee grounds. Then throw the medicine away in a sealed container like a sealed bag or a coffee can with a lid. Do not use the medicine after the expiration date. Store at room temperature between 15 and 30 degrees C (59 and 86 degrees F). NOTE: This sheet is a summary. It may not cover all possible information. If you have questions about this medicine, talk to your doctor, pharmacist, or health care provider.  2017 Elsevier/Gold Standard (2013-11-27 15:26:50)  Occipital Neuralgia Occipital neuralgia is a type of headache that causes episodes of very bad pain in the back of your head. Pain from occipital neuralgia may spread (radiate) to other parts of your head. The pain is usually brief and often goes away after you rest and  relax. These headaches may be caused by irritation of the nerves that leave your spinal cord high up in your neck, just below the base of your skull (occipital nerves). Your occipital nerves transmit sensations from the back of your head, the top of your head, and the areas behind your ears. CAUSES Occipital neuralgia can occur without any known cause (primary headache syndrome). In other cases, occipital neuralgia is caused by pressure on or irritation of one of the two occipital nerves. Causes of occipital nerve compression or irritation include:  Wear and tear of the vertebrae in the neck  (osteoarthritis).  Neck injury.  Disease of the disks that separate the vertebrae.  Tumors.  Gout.  Infections.  Diabetes.  Swollen blood vessels that put pressure on the occipital nerves.  Muscle spasm in the neck. SIGNS AND SYMPTOMS Pain is the main symptom of occipital neuralgia. It usually starts in the back of the head but may also be felt in other areas supplied by the occipital nerves. Pain is usually on one side but may be on both sides. You may have:   Brief episodes of very bad pain that is burning, stabbing, shocking, or shooting.  Pain behind the eye.  Pain triggered by neck movement or hair brushing.  Scalp tenderness.  Aching in the back of the head between episodes of very bad pain. DIAGNOSIS  Your health care provider may diagnose occipital neuralgia based on your symptoms and a physical exam. During the exam, the health care provider may push on areas supplied by the occipital nerves to see if they are painful. Some tests may also be done to help in making the diagnosis. These may include:  Imaging studies of the upper spinal cord, such as an MRI or CT scan. These may show compression or spinal cord abnormalities.  Nerve block. You will get an injection of numbing medicine (local anesthetic) near the occipital nerve to see if this relieves pain. TREATMENT  Treatment may begin with simple measures, such as:   Rest.  Massage.  Heat.  Over-the-counter pain relievers. If these measures do not work, you may need other treatments, including:  Medicines such as:  Prescription-strength anti-inflammatory medicines.  Muscle relaxants.  Antiseizure medicines.  Antidepressants.  Steroid injection. This involves injections of local anesthetic and strong anti-inflammatory drugs (steroids).  Pulsed radiofrequency. Wires are implanted to deliver electrical impulses that block pain signals from the occipital nerve.  Physical therapy.  Surgery to relieve  nerve pressure. HOME CARE INSTRUCTIONS  Take all medicines as directed by your health care provider.  Avoid activities that cause pain.  Rest when you have an attack of pain.  Try gentle massage or a heating pad to relieve pain.  Work with a physical therapist to learn stretching exercises you can do at home.  Try a different pillow or sleeping position.  Practice good posture.  Try to stay active. Get regular exercise that does not cause pain. Ask your health care provider to suggest safe exercises for you.  Keep all follow-up visits as directed by your health care provider. This is important. SEEK MEDICAL CARE IF:  Your medicine is not working.  You have new or worsening symptoms. SEEK IMMEDIATE MEDICAL CARE IF:  You have very bad head pain that is not going away.  You have a sudden change in vision, balance, or speech. MAKE SURE YOU:  Understand these instructions.  Will watch your condition.  Will get help right away if you are not doing  well or get worse. This information is not intended to replace advice given to you by your health care provider. Make sure you discuss any questions you have with your health care provider. Document Released: 09/25/2001 Document Revised: 10/22/2014 Document Reviewed: 09/23/2013 Elsevier Interactive Patient Education  2017 [employer].

## 2016-09-11 NOTE — Progress Notes (Signed)
[facility]    Provider:  Dr Jaynee Eagles Referring Provider: Lucille Passy, MD Primary Care Physician:  Arnette Norris, MD  CC:  Occipital Neuralgia  HPI:  Peter Rose is a 60 y.o. male here as a referral from Dr. Deborra Medina for occipital neuralgia. He has headaches on the right side of his head. Feels like shooting pins. It is continuously a 2-3/10 but can have episodes of 10/10. In October was coughing and sneezing a lot with a sinus infection and he was treated and the headaches started around that time. It is worsening. It is on the right side of the head. The pain starts in the back of the head (points to the occipital region) and shoots to the front, paroxysmal, brief, severe. Left with a dull continuous headache. Blurry vision in the right eye. He is on Prednisone and hydrocodone which has helped. Starts around 1-2 pm in the day and by the evening it is severe. He also has sensitivity of the scalp. No history of migraines or headache. No significant light sensitivity but going into a dark room and laying down helps. No nausea or vomiting. No other associated symptoms or focal neurological. No neck pain. He moves big cranes around around and he "moves his head around" a lot. Better after he sleeps. No other inciting events, no other associated symptoms or focal neurologic deficits.  Reviewed notes, labs and imaging from outside physicians, which showed:  CT of the head 08/2016 showed No acute intracranial abnormalities including mass lesion or mass effect, hydrocephalus, extra-axial fluid collection, midline shift, hemorrhage, or acute infarction, large ischemic events (personally reviewed images)   Review of Systems: Patient complains of symptoms per HPI as well as the following symptoms: No CP, no SOB. Pertinent negatives per HPI. All others negative.   Social History   Social History  . Marital status: Single    Spouse name: N/A  . Number of children: 3  . Years of  education: 12   Occupational History  . [employer]    Social History Main Topics  . Smoking status: Current Every Day Smoker    Packs/day: 1.00  . Smokeless tobacco: Never Used  . Alcohol use Yes     Comment: occasional  . Drug use: No  . Sexual activity: Not on file   Other Topics Concern  . Not on file   Social History Narrative   Lives alone   Caffeine use:  2 cups - coffee   Tea daily    Family History  Problem Relation Age of Onset  . Headache Neg Hx     Past Medical History:  Diagnosis Date  . Psoriasis     Past Surgical History:  Procedure Laterality Date  . NO PAST SURGERIES      Current Outpatient Prescriptions  Medication Sig Dispense Refill  . dihydroergotamine (MIGRANAL) 4 MG/ML nasal spray Use one spray in each nostril.  Wait 15 minutes and administer another spray into each nostril for a total of four.  Do not repeat more than once a day. 8 mL 1  . HYDROcodone-acetaminophen (NORCO/VICODIN) 5-325 MG tablet Take 1-2 tablets by mouth every 4 (four) hours as needed for moderate pain. 15 tablet 0  . levocetirizine (XYZAL) 5 MG tablet Take 1 tablet (5 mg total) by mouth every evening. 30 tablet 2  . methotrexate (RHEUMATREX) 2.5 MG tablet TAKE 10 TABLETS BY MOUTH EVERY WEEK 40 tablet 3  . predniSONE (DELTASONE) 20 MG tablet TAKE 3 TABLETS  BY MOUTH FOR 3 DAYS, 2 TABLETS FOR 3 DAYS, THEN 1 TABLET FOR 3 DAYS. 18 tablet 0  . sildenafil (VIAGRA) 100 MG tablet take 1 tablet by mouth every 36 HOURS if needed 8 tablet 0  . varenicline (CHANTIX STARTING MONTH PAK) 0.5 MG X 11 & 1 MG X 42 tablet Take one 0.5 mg tablet by mouth once daily for 3 days, then increase to one 0.5 mg tablet twice daily for 4 days, then increase to one 1 mg tablet twice daily. 53 tablet 0  . gabapentin (NEURONTIN) 300 MG capsule Take 1 capsule (300 mg total) by mouth 3 (three) times daily. 90 capsule 11   No current facility-administered medications for this visit.     Allergies as of  09/11/2016 - Review Complete 09/11/2016  Allergen Reaction Noted  . Codeine Other (See Comments) 09/29/2013  . Other  09/29/2013  . Vicodin [hydrocodone-acetaminophen]  05/28/2012    Vitals: BP 131/87   Pulse 64   Ht 5\' 10"  (1.778 m)   Wt 210 lb 3.2 oz (95.3 kg)   BMI 30.16 kg/m  Last Weight:  Wt Readings from Last 1 Encounters:  09/11/16 210 lb 3.2 oz (95.3 kg)   Last Height:   Ht Readings from Last 1 Encounters:  09/11/16 5\' 10"  (1.778 m)    Physical exam: Exam: Gen: NAD, conversant, well nourised, obese, well groomed                     CV: RRR, no MRG. No Carotid Bruits. No peripheral edema, warm, nontender Eyes: Conjunctivae clear without exudates or hemorrhage  Neuro: Detailed Neurologic Exam  Speech:    Speech is normal; fluent and spontaneous with normal comprehension.  Cognition:    The patient is oriented to person, place, and time;     recent and remote memory intact;     language fluent;     normal attention, concentration,     fund of knowledge Cranial Nerves:    The pupils are equal, round, and reactive to light. The fundi are normal and spontaneous venous pulsations are present. Visual fields are full to finger confrontation. Extraocular movements are intact. Trigeminal sensation is intact and the muscles of mastication are normal. The face is symmetric. The palate elevates in the midline. Hearing intact. Voice is normal. Shoulder shrug is normal. The tongue has normal motion without fasciculations.   Coordination:    Normal finger to nose and heel to shin. Normal rapid alternating movements.   Gait:    Heel-toe and tandem gait are normal.   Motor Observation:    No asymmetry, no atrophy, and no involuntary movements noted. Tone:    Normal muscle tone.    Posture:    Posture is normal. normal erect    Strength:    Strength is V/V in the upper and lower limbs.      Sensation: intact to LT     Reflex Exam:  DTR's:    Deep tendon reflexes  in the upper and lower extremities are normal bilaterally.   Toes:    The toes are downgoing bilaterally.   Clonus:    Clonus is absent.      Assessment/Plan:  60 year old male with new right-sided headache possibly occipital neuralgia intractable since October. Neuro exam in non focal. Pain is on the right side and is located in the distribution of the greater, lesser and/or third occipital nerves an din the trigeminal nerve distribution, is paroxysmal,  painful, sharp, with a dull headache. Differential includes pain in the upper cervical joints or disks, suboccipital or upper posterior neck muscles including the traps/scm, spinal and posterior cranial fossa dura mater, vertebral arteries, structural and infiltrative lesions such as meningioma, schwannoma, myelitis, compressive disk disease and others. CT of the head is normal. No neck pain.   As far as your medications are concerned, I would like to suggest: Neurontin 300mg  three times a day  As far as diagnostic testing: Labs  Will perform right-sided nerve blocks:  Performed by Dr. Jaynee Eagles M.D. 10 ml Lidocaine 1%,10 ml Marcaine and one ml depo-medrol  0.5% in the 30-gauge needle was used. All procedures a documented blood were medically necessary, reasonable and appropriate based on the patient's history, medical diagnosis and physician opinion. Verbal informed consent was obtained from the patient, patient was informed of potential risk of procedure, including bruising, bleeding, hematoma formation, infection, muscle weakness, muscle pain, numbness, transient hypertension, transient hyperglycemia and transient insomnia among others. All areas injected were topically clean with isopropyl rubbing alcohol. Nonsterile nonlatex gloves were worn during the procedure.  1. Greater occipital nerve block [phone]). The greater occipital nerve site was identified at the nuchal line medial to the occipital artery. Medication was injected into the right  occipital nerve areas and suboccipital areas. Patient's condition is associated with inflammation of the greater occipital nerve and associated multiple groups. Injection was deemed medically necessary, reasonable and appropriate. Injection represents a separate and unique surgical service.  2. Lesser occipital nerve block [phone]). The lesser occipital nerve site was identified approximately 2 cm lateral to the greater occipital nerve. Medication was injected into the right occipital nerve areas. Patient's condition is associated with inflammation of the lesser occipital nerve and associated muscle groups. Injection was deemed medically necessary, reasonable and appropriate. Injection represents a separate and unique surgical service.   3. Auriculotemporal nerve block [id number]): The Auriculotemporal nerve site was identified along the posterior margin of the sternocleidomastoid muscle toward the base of the ear. Medication was injected into the right radicular temporal nerve areas. Patient's condition is associated with inflammation of the Auriculotemporal Nerve and associated muscle groups. Injection was deemed medically necessary, reasonable and appropriate. Injection represents a separate and unique surgical service.  4. Supraorbital nerve block ([id number]): Supraorbital nerve site was identified along the incision of the frontal bone on the orbital/supraorbital ridge. Medication was injected into the right supraorbital nerve areas. Patient's condition is associated with inflammation of the supraorbital and associated muscle groups. Injection was deemed medically necessary, reasonable and appropriate. Injection represents a separate and unique surgical service.  Discussed: To prevent or relieve headaches, try the following: Cool Compress. Lie down and place a cool compress on your head.  Avoid headache triggers. If certain foods or odors seem to have triggered your migraines in the past, avoid them. A  headache diary might help you identify triggers.  Include physical activity in your daily routine. Try a daily walk or other moderate aerobic exercise.  Manage stress. Find healthy ways to cope with the stressors, such as delegating tasks on your to-do list.  Practice relaxation techniques. Try deep breathing, yoga, massage and visualization.  Eat regularly. Eating regularly scheduled meals and maintaining a healthy diet might help prevent headaches. Also, drink plenty of fluids.  Follow a regular sleep schedule. Sleep deprivation might contribute to headaches Consider biofeedback. With this mind-body technique, you learn to control certain bodily functions - such as muscle tension, heart rate and blood pressure -  to prevent headaches or reduce headache pain.    Proceed to emergency room if you experience new or worsening symptoms or symptoms do not resolve, if you have new neurologic symptoms or if headache is severe, or for any concerning symptom.    Sarina Ill, MD  [facility] [address] [place], [place] [address]  Phone [phone] Fax [phone]

## 2016-09-11 NOTE — Progress Notes (Signed)
Nerve Block:  Lidocaine 2% Lot: [id number] Expiration 11/2019 NDC: [id number]  Depo-medrol 80mg /mL Lot: [id number] Expiration: 06/2017 NDC: [id number]  Marcaine 0.5% Lot: [id number] Expiration: 05/15/2018 NDC: [id number]  72mL syringe- 1 mL depo-medrol, 41mL lidocaine and 60mL marcaine 16mL syringe x2- 1.77mL lidocaine and 1.13mL marcaine

## 2016-09-12 ENCOUNTER — Telehealth: Payer: Self-pay | Admitting: *Deleted

## 2016-09-12 LAB — C-REACTIVE PROTEIN: CRP: 0.4 mg/L (ref 0.0–4.9)

## 2016-09-12 LAB — SEDIMENTATION RATE: SED RATE: 2 mm/h (ref 0–30)

## 2016-09-12 NOTE — Telephone Encounter (Signed)
-----   Message from Melvenia Beam, MD sent at 09/12/2016  1:13 PM EST ----- Labs normal

## 2016-09-12 NOTE — Telephone Encounter (Signed)
Called and spoke to pt 11/29 and told him to call 11/30 and let us know if he is still having headache. If not, he can cx nerve block appt and will not charge cx fee per AA,MD. Pt aware of this too.  Advised him labs normal per AA,MD. He verbalized understanding.

## 2016-09-13 ENCOUNTER — Ambulatory Visit: Payer: Self-pay | Admitting: Neurology

## 2016-09-13 NOTE — Telephone Encounter (Signed)
Dr Jaynee EaglesJuluis Rainier, thank you

## 2016-09-13 NOTE — Telephone Encounter (Signed)
Pt called and advised me that he will be cancelling his 4:30pm nerve block appt with Dr. Jaynee Eagles because he "feels great" and has "slept better than he ever has." He will call back to reschedule if he needs it.

## 2016-09-17 NOTE — Telephone Encounter (Signed)
Patient called about his Chantix rx.  Patient said the insurance is waiting for someone at our office to call them and let them know why patient needs Chantix.  Patient called pharmacy about changing his address,but his pharmacy said that didn't have anything to do with it.

## 2016-09-18 NOTE — Telephone Encounter (Signed)
PA for chantix completed online via covermymeds. Response to be received within 72 hours.

## 2016-09-20 ENCOUNTER — Telehealth: Payer: Self-pay | Admitting: Family Medicine

## 2016-09-20 MED ORDER — BUPROPION HCL ER (SR) 100 MG PO TB12: 100.0000 mg | ORAL_TABLET | Freq: Two times a day (BID) | ORAL | 3 refills | Status: DC

## 2016-09-20 NOTE — Telephone Encounter (Signed)
Pt called [facility] and was advised to have Dr office call and let them know he has tried the generic med and gum, etc and they will approve. He called [phone]. Call him with any questions.

## 2016-09-20 NOTE — Telephone Encounter (Signed)
Noted.  eRx sent. 

## 2016-09-20 NOTE — Telephone Encounter (Signed)
Response received indicating PA denied. Pt must first try Zyban AND one of the following nicotine gum, nicotine lozenges, nicotine patches, which must be purchased OTC not Rx per letter. pls advise

## 2016-09-20 NOTE — Telephone Encounter (Signed)
Spoke to pt and advised that he has not tried zyban previously, and must do so in conjunction with and OTC Tx. Pt request Rx be sent to pharmacy (see additional phone note 11/27)

## 2016-09-20 NOTE — Telephone Encounter (Signed)
Noted . Please inform pt

## 2016-09-20 NOTE — Telephone Encounter (Signed)
Spoke to pt and advised. States he will try OTC meds and "go from there." copy of denial faxed to pharmacy

## 2016-12-18 ENCOUNTER — Other Ambulatory Visit: Payer: Self-pay | Admitting: Family Medicine

## 2016-12-26 ENCOUNTER — Ambulatory Visit (INDEPENDENT_AMBULATORY_CARE_PROVIDER_SITE_OTHER): Payer: Commercial Managed Care - PPO | Admitting: Family Medicine

## 2016-12-26 ENCOUNTER — Encounter: Payer: Self-pay | Admitting: Family Medicine

## 2016-12-26 ENCOUNTER — Telehealth: Payer: Self-pay

## 2016-12-26 VITALS — BP 140/90 | HR 80 | Temp 97.9°F | Wt 218.0 lb

## 2016-12-26 DIAGNOSIS — L409 Psoriasis, unspecified: Secondary | ICD-10-CM | POA: Diagnosis not present

## 2016-12-26 DIAGNOSIS — R972 Elevated prostate specific antigen [PSA]: Secondary | ICD-10-CM

## 2016-12-26 DIAGNOSIS — Z720 Tobacco use: Secondary | ICD-10-CM

## 2016-12-26 DIAGNOSIS — G4700 Insomnia, unspecified: Secondary | ICD-10-CM | POA: Insufficient documentation

## 2016-12-26 DIAGNOSIS — F419 Anxiety disorder, unspecified: Secondary | ICD-10-CM | POA: Insufficient documentation

## 2016-12-26 LAB — COMPREHENSIVE METABOLIC PANEL
ALBUMIN: 4.5 g/dL (ref 3.5–5.2)
ALK PHOS: 54 U/L (ref 39–117)
ALT: 29 U/L (ref 0–53)
AST: 27 U/L (ref 0–37)
BILIRUBIN TOTAL: 0.4 mg/dL (ref 0.2–1.2)
BUN: 12 mg/dL (ref 6–23)
CO2: 31 mEq/L (ref 19–32)
Calcium: 10.1 mg/dL (ref 8.4–10.5)
Chloride: 103 mEq/L (ref 96–112)
Creatinine, Ser: 1.2 mg/dL (ref 0.40–1.50)
GFR: 65.36 mL/min (ref >60.00)
GLUCOSE: 100 mg/dL — AB (ref 70–99)
POTASSIUM: 4.5 meq/L (ref 3.5–5.1)
Sodium: 138 mEq/L (ref 135–145)
TOTAL PROTEIN: 7.4 g/dL (ref 6.0–8.3)

## 2016-12-26 LAB — CBC WITH DIFFERENTIAL/PLATELET
BASOS ABS: 0.1 10*3/uL (ref 0.0–0.1)
Basophils Relative: 0.9 % (ref 0.0–3.0)
EOS PCT: 2.9 % (ref 0.0–5.0)
Eosinophils Absolute: 0.2 10*3/uL (ref 0.0–0.7)
HEMATOCRIT: 48.3 % (ref 39.0–52.0)
HEMOGLOBIN: 16.7 g/dL (ref 13.0–17.0)
LYMPHS PCT: 26.9 % (ref 12.0–46.0)
Lymphs Abs: 1.8 10*3/uL (ref 0.7–4.0)
MCHC: 34.5 g/dL (ref 30.0–36.0)
MCV: 93.4 fl (ref 78.0–100.0)
MONOS PCT: 10.8 % (ref 3.0–12.0)
Monocytes Absolute: 0.7 10*3/uL (ref 0.1–1.0)
Neutro Abs: 4 10*3/uL (ref 1.4–7.7)
Neutrophils Relative %: 58.5 % (ref 43.0–77.0)
Platelets: 220 10*3/uL (ref 150.0–400.0)
RBC: 5.17 Mil/uL (ref 4.22–5.81)
RDW: 13.7 % (ref 11.5–15.5)
WBC: 6.8 10*3/uL (ref 4.0–10.5)

## 2016-12-26 LAB — PSA: PSA: 5.83 ng/mL — ABNORMAL HIGH (ref 0.10–4.00)

## 2016-12-26 LAB — LIPID PANEL
CHOLESTEROL: 176 mg/dL (ref 0–200)
HDL: 37.2 mg/dL — AB (ref >39.00)
NonHDL: 138.53
TRIGLYCERIDES: 276 mg/dL — AB (ref 0.0–149.0)
Total CHOL/HDL Ratio: 5
VLDL: 55.2 mg/dL — ABNORMAL HIGH (ref 0.0–40.0)

## 2016-12-26 LAB — LDL CHOLESTEROL, DIRECT: Direct LDL: 97 mg/dL

## 2016-12-26 MED ORDER — SILDENAFIL CITRATE 100 MG PO TABS: ORAL_TABLET | ORAL | 0 refills | Status: DC

## 2016-12-26 MED ORDER — ALPRAZOLAM 0.25 MG PO TABS: 0.2500 mg | ORAL_TABLET | Freq: Two times a day (BID) | ORAL | 0 refills | Status: DC | PRN

## 2016-12-26 MED ORDER — VARENICLINE TARTRATE 0.5 MG X 11 & 1 MG X 42 PO MISC
ORAL | 0 refills | Status: DC
Start: 2016-12-26 — End: 2017-09-02

## 2016-12-26 MED ORDER — TRAZODONE HCL 50 MG PO TABS: 25.0000 mg | ORAL_TABLET | Freq: Every evening | ORAL | 3 refills | Status: DC | PRN

## 2016-12-26 NOTE — Progress Notes (Signed)
Subjective:   Patient ID: Peter Rose, male    DOB: [birth year], 61 y.o.   MRN: [id number]  Peter Rose is a pleasant 61 y.o. year old male who presents to clinic today with Discuss Medications; Sleeping Problem; and Nicotine Dependence (Wellbutrin did not work. Would like to try Chantix.)  on 12/26/2016  HPI:  Got injured at work (4 foot fall) in 09/2016- having to have knee surgery.  Smoking cessation- insurance would not cover chantix.  Asked him to try zyban first which he did.  It was not effective.  He's been having more issues falling and staying asleep. Has tried antihistamines but nothing stronger.  He is asking for xanax.  Asking for refill of viagra.   Current Outpatient Prescriptions on File Prior to Visit  Medication Sig Dispense Refill  . dihydroergotamine (MIGRANAL) 4 MG/ML nasal spray Use one spray in each nostril.  Wait 15 minutes and administer another spray into each nostril for a total of four.  Do not repeat more than once a day. 8 mL 1  . levocetirizine (XYZAL) 5 MG tablet Take 1 tablet (5 mg total) by mouth every evening. 30 tablet 2  . methotrexate (RHEUMATREX) 2.5 MG tablet TAKE 10 TABLETS BY MOUTH EVERY WEEK 40 tablet 3   No current facility-administered medications on file prior to visit.     Allergies  Allergen Reactions  . Codeine Other (See Comments)    Eels wirey and jittery  . Other     HYCODAN: Makes pt feel weird/jittery  . Vicodin [Hydrocodone-Acetaminophen]     Pt feels "wired" with pain meds    Past Medical History:  Diagnosis Date  . Psoriasis     Past Surgical History:  Procedure Laterality Date  . NO PAST SURGERIES      Family History  Problem Relation Age of Onset  . Headache Neg Hx     Social History   Social History  . Marital status: Single    Spouse name: N/A  . Number of children: 3  . Years of education: 12   Occupational History  . [employer]    Social History Main Topics  . Smoking status: Current  Every Day Smoker    Packs/day: 1.00  . Smokeless tobacco: Never Used  . Alcohol use Yes     Comment: occasional  . Drug use: No  . Sexual activity: Not on file   Other Topics Concern  . Not on file   Social History Narrative   Lives alone   Caffeine use:  2 cups - coffee   Tea daily   The PMH, PSH, Social History, Family History, Medications, and allergies have been reviewed in [facility], and have been updated if relevant.   Review of Systems  Constitutional: Negative.   Respiratory: Negative.   Cardiovascular: Negative.   Psychiatric/Behavioral: Positive for sleep disturbance. Negative for behavioral problems, confusion, decreased concentration, dysphoric mood and self-injury. The patient is nervous/anxious.   All other systems reviewed and are negative.      Objective:    BP 140/90 (BP Location: Right Arm, Patient Position: Sitting, Cuff Size: Large)   Pulse 80   Temp 97.9 F (36.6 C) (Oral)   Wt 218 lb (98.9 kg)   SpO2 98%   BMI 31.28 kg/m    Physical Exam   General:  pleasant male in no acute distress Eyes:  PERRL Ears:  External ear exam shows no significant lesions or deformities.  TMs normal bilaterally Hearing is grossly  normal bilaterally. Nose:  External nasal examination shows no deformity or inflammation. Nasal mucosa are pink and moist without lesions or exudates. Mouth:  Oral mucosa and oropharynx without lesions or exudates.  Teeth in good repair. Neck:  no carotid bruit or thyromegaly no cervical or supraclavicular lymphadenopathy  Lungs:  Normal respiratory effort, chest expands symmetrically. Lungs are clear to auscultation, no crackles or wheezes. Heart:  Normal rate and regular rhythm. S1 and S2 normal without gallop, murmur, click, rub or other extra sounds. Pulses:  R and L posterior tibial pulses are full and equal bilaterally  Extremities:  no edema  Psych:  Good eye contact, not anxious or depressed appearing      Assessment & Plan:    Elevated PSA - Plan: PSA  Psoriasis - Plan: CBC with Differential/Platelet, Comprehensive metabolic panel, Lipid panel  Tobacco abuse  Insomnia, unspecified type  Anxiety No Follow-up on file.

## 2016-12-26 NOTE — Telephone Encounter (Signed)
PA started on CMM. Waiting for next step response

## 2016-12-26 NOTE — Assessment & Plan Note (Signed)
>  25 min spent with patient, at least half of which was spent on counseling insomnia.  The problem of recurrent insomnia is discussed. Avoidance of caffeine sources is strongly encouraged. Sleep hygiene issues are reviewed.  eRx sent for trazodone. The use of sedative hypnotics for temporary relief is appropriate; we discussed the addictive nature of these drugs, and a one-time only prescription for prn use of a hypnotic is given, to use no more than 3 times per week for 2-3 weeks.

## 2016-12-26 NOTE — Assessment & Plan Note (Signed)
Rx printed for chnatix and given to pt.

## 2016-12-26 NOTE — Patient Instructions (Signed)
Great to see you.   We are starting Trazodone 25- 50 mg nightly at bedtime, as needed xanax for pesristent symptoms.   Congratulations on wanting to quit!

## 2016-12-26 NOTE — Progress Notes (Signed)
Pre visit review using our clinic review tool, if applicable. No additional management support is needed unless otherwise documented below in the visit note. 

## 2016-12-27 ENCOUNTER — Telehealth: Payer: Self-pay

## 2016-12-27 DIAGNOSIS — R972 Elevated prostate specific antigen [PSA]: Secondary | ICD-10-CM

## 2016-12-27 NOTE — Telephone Encounter (Signed)
Spoke to patient he is aware that [facility] will contact him to schedule  Put on bua workque 3/15/rbh

## 2016-12-27 NOTE — Telephone Encounter (Signed)
Thank you!  Referral placed.

## 2016-12-27 NOTE — Telephone Encounter (Signed)
I was talking to the pt about something else and he asked about his labs because he does not have access to [place]. I told him the results. He does not see a urologist. Would like a referral to one in [place].

## 2016-12-31 NOTE — Telephone Encounter (Signed)
Spoke to pt. Advised him that the starter pack was approved. Told him there was a possibility the continuation pack may require a PA. He was very excited.

## 2017-01-03 ENCOUNTER — Other Ambulatory Visit: Payer: Self-pay | Admitting: *Deleted

## 2017-01-03 DIAGNOSIS — R972 Elevated prostate specific antigen [PSA]: Secondary | ICD-10-CM

## 2017-01-04 ENCOUNTER — Ambulatory Visit: Payer: Self-pay | Admitting: Urology

## 2017-01-10 ENCOUNTER — Telehealth: Payer: Self-pay | Admitting: Family Medicine

## 2017-01-10 ENCOUNTER — Other Ambulatory Visit: Payer: Self-pay | Admitting: Orthopedic Surgery

## 2017-01-10 NOTE — Telephone Encounter (Signed)
I cannot give pre op clearance without an EKG and surgical clearance.  I would suggest he call HR to see if they can send him to a cardiologist through workers comp to provide surgical clearance.

## 2017-01-10 NOTE — Telephone Encounter (Signed)
I called patient to schedule pre-op clearance.  Paient has been out of work since December.  Patient said he can't afford anymore medical bills.  Patient was seen a couple weeks ago and said he mentioned the surgery when he was here. Patient said the surgery will be workers comp..Surgical Clearance form is in Dr.Aron's In box.

## 2017-01-10 NOTE — Telephone Encounter (Signed)
I let patient know Dr.Aron's comments.  Patient said he'll check with his attorney.

## 2017-01-15 NOTE — Progress Notes (Deleted)
01/18/2017 9:44 PM   Peter Rose Jul 11, 1956 [id number]  Referring provider: Lucille Passy, MD [place], [place] [address]  No chief complaint on file.   HPI: Patient is a 61 year old Caucasian male who was referred to [place] by Dr. Deborra Rose for an elevated PSA.  Patient was found to have a PSA of 5.83 ng/mL on 12/26/2016. Previous PSAs have been 4.75 in April 2017 and 4.30 and August 2013.  BPH WITH LUTS His IPSS score today is ***, which is *** lower urinary tract symptomatology. He is *** with his quality life due to his urinary symptoms. His PVR is *** mL.  His previous IPSS score was ***.  His previous PVR is *** mL.    His major complaint today ***.  He has had these symptoms for *** years.  He denies any dysuria, hematuria or suprapubic pain.   He currently taking ***.  His has had ***.  Previous PSA's:     He also denies any recent fevers, chills, nausea or vomiting.  He has a family history of PCa, with ***.   He does not have a family history of PCa.***    Score:  1-7 Mild 8-19 Moderate 20-35 Severe   Erectile dysfunction His SHIM score is ***, which is ***.   His previous SHIM score was ***.  He has been having difficulty with erections for ***.   His major complaint is ***.  His libido is ***.   His risk factors for ED are age, pelvic radiation, BPH, prostate cancer, stroke, Parkinson's disease, MS, hypogonadism, spinal injury, brain injury, DM, HTN, HLD, hypothyroidism, sleep apnea, CAD, stress, night shift work, anxiety, depression, alcohol abuse, smoking, antidepressants, pain medication and blood pressure medications. ***  He denies any painful erections or curvatures with his erections.   He is still having/no longer having spontaneous erections.  He has tried *** in the past.       Score: 1-7 Severe ED 8-11 Moderate ED 12-16 Mild-Moderate ED 17-21 Mild ED 22-25 No ED       PMH: Past Medical History:  Diagnosis Date  . Psoriasis      Surgical History: Past Surgical History:  Procedure Laterality Date  . NO PAST SURGERIES      Home Medications:  Allergies as of 01/18/2017      Reactions   Codeine Other (See Comments)   feels wired and jittery   Other    HYCODAN: Makes pt feel weird/jittery   Vicodin [hydrocodone-acetaminophen]    Pt feels "wired" with pain meds      Medication List       Accurate as of 01/15/17  9:44 PM. Always use your most recent med list.          ALPRAZolam 0.25 MG tablet Commonly known as:  XANAX Take 1 tablet (0.25 mg total) by mouth 2 (two) times daily as needed for anxiety.   dihydroergotamine 4 MG/ML nasal spray Commonly known as:  MIGRANAL Use one spray in each nostril.  Wait 15 minutes and administer another spray into each nostril for a total of four.  Do not repeat more than once a day.   GOODY HEADACHE PO Take 1-2 packets by mouth daily as needed (headache/pain). Depends on level of pain if takes 1-2   ibuprofen 800 MG tablet Commonly known as:  ADVIL,MOTRIN Take 800 mg by mouth every 8 (eight) hours as needed for moderate pain.   levocetirizine 5 MG tablet Commonly  known as:  XYZAL Take 1 tablet (5 mg total) by mouth every evening.   methotrexate 2.5 MG tablet Commonly known as:  RHEUMATREX TAKE 10 TABLETS BY MOUTH EVERY WEEK   sildenafil 100 MG tablet Commonly known as:  VIAGRA take 1 tablet by mouth EVERY 36 HOURS if needed   traZODone 50 MG tablet Commonly known as:  DESYREL Take 0.5-1 tablets (25-50 mg total) by mouth at bedtime as needed for sleep.   varenicline 0.5 MG X 11 & 1 MG X 42 tablet Commonly known as:  CHANTIX STARTING MONTH PAK Take one 0.5 mg tablet by mouth once daily for 3 days, then increase to one 0.5 mg tablet twice daily for 4 days, then increase to one 1 mg tablet twice daily.       Allergies:  Allergies  Allergen Reactions  . Codeine Other (See Comments)    feels wired and jittery  . Other     HYCODAN: Makes pt feel  weird/jittery  . Vicodin [Hydrocodone-Acetaminophen]     Pt feels "wired" with pain meds    Family History: Family History  Problem Relation Age of Onset  . Headache Neg Hx     Social History:  reports that he has been smoking.  He has been smoking about 1.00 pack per day. He has never used smokeless tobacco. He reports that he drinks alcohol. He reports that he does not use drugs.  ROS:                                        Physical Exam: There were no vitals taken for this visit.  Constitutional: Well nourished. Alert and oriented, No acute distress. HEENT: [name] AT, moist mucus membranes. Trachea midline, no masses. Cardiovascular: No clubbing, cyanosis, or edema. Respiratory: Normal respiratory effort, no increased work of breathing. GI: Abdomen is soft, non tender, non distended, no abdominal masses. Liver and spleen not palpable.  No hernias appreciated.  Stool sample for occult testing is not indicated.   GU: No CVA tenderness.  No bladder fullness or masses.  Patient with circumcised/uncircumcised phallus. ***Foreskin easily retracted***  Urethral meatus is patent.  No penile discharge. No penile lesions or rashes. Scrotum without lesions, cysts, rashes and/or edema.  Testicles are located scrotally bilaterally. No masses are appreciated in the testicles. Left and right epididymis are normal. Rectal: Patient with  normal sphincter tone. Anus and perineum without scarring or rashes. No rectal masses are appreciated. Prostate is approximately *** grams, *** nodules are appreciated. Seminal vesicles are normal. Skin: No rashes, bruises or suspicious lesions. Lymph: No cervical or inguinal adenopathy. Neurologic: Grossly intact, no focal deficits, moving all 4 extremities. Psychiatric: Normal mood and affect.  Laboratory Data: Lab Results  Component Value Date   WBC 6.8 12/26/2016   HGB 16.7 12/26/2016   HCT 48.3 12/26/2016   MCV 93.4 12/26/2016   PLT  220.0 12/26/2016    Lab Results  Component Value Date   CREATININE 1.20 12/26/2016    Lab Results  Component Value Date   PSA 5.83 (H) 12/26/2016   PSA 4.75 (H) 01/31/2016   PSA 4.30 (H) 05/28/2012        Component Value Date/Time   CHOL 176 12/26/2016 0827   HDL 37.20 (L) 12/26/2016 0827   CHOLHDL 5 12/26/2016 0827   VLDL 55.2 (H) 12/26/2016 0827   LDLCALC 86 01/31/2016 0739  Lab Results  Component Value Date   AST 27 12/26/2016   Lab Results  Component Value Date   ALT 29 12/26/2016     Urinalysis    Component Value Date/Time   LABSPEC 1.010 04/06/2014 1211   GLUCOSEU NEGATIVE 04/06/2014 1211   HGBUR TRACE 04/06/2014 1211   PROTEINUR NEGATIVE 04/06/2014 1211    Pertinent Imaging: ***  Assessment & Plan:  ***  1. Elevated PSA  - discussed with the patient that PSA is an acronym for  prostate specific antigen,  which is a protein made by the prostate gland and can be detected in the blood stream. I explained to the patient situations that would increase the PSA, such as: a man's age,  BPH, infection, recent intercourse/ejaculation, prostate infarction, recent urethroscopic manipulation (Foley placement/cystoscopy) and prostate cancer.   - At this time, I have advised the patient that we will repeat the PSA to rule out lab error.  If that should return elevated, we could continue observation or pursue a prostate biopsy.   - discussed that indications for prostate biopsy are defined by age and race specific PSA cutoffs as well as a PSA velocity of 0.75/year.  - RTC pending PSA results  2. BPH with LUTS  - IPSS score is ***, it is stable/improving/worsening  - Continue conservative management, avoiding bladder irritants and timed voiding's  - most bothersome symptoms is/are ***  - Initiate alpha-blocker (***), discussed side effects  - Initiate 5 alpha reductase inhibitor (***), discussed side effects  - Continue tamsulosin 0.4 mg daily, alfuzosin 10 mg  daily, Rapaflo 8 mg daily, terazosin, doxazosin, Cialis 5 mg daily and finasteride 5 mg daily, dutasteride 0.5 mg daily***:refills given  - Cannot tolerate medication or medication failure, schedule cystoscopy ***  - RTC in *** months for IPSS, PSA, PVR and exam   3. Erectile dysfunction  - SHIM score is ***, it is stable, worsening, improving  - I explained to the patient that in order to achieve an erection it takes good functioning of the nervous system (parasympathetic, sympathetic, sensory and motor), good blood flow into the erectile tissue of the penis and a desire to have sex  - I explained that conditions like diabetes, hypertension, coronary artery disease, peripheral vascular disease, smoking, alcohol consumption, age, sleep apnea and BPH can diminish the ability to have an erection  - We discussed trying a *** different PDE5 inhibitor, intra-urethral suppositories, intracavernous vasoactive drug injection therapy, vacuum constriction device and penile prosthesis implantation  - He would like to try ***  - Continue Cialis, Viagra, Levitra, Stendra  - RTC in *** months for repeat SHIM score and exam       No Follow-up on file.  These notes generated with voice recognition software. I apologize for typographical errors.  Zara Council, [facility] [address] [place], [place] [address] [phone]

## 2017-01-16 ENCOUNTER — Other Ambulatory Visit: Payer: Self-pay | Admitting: *Deleted

## 2017-01-16 DIAGNOSIS — R972 Elevated prostate specific antigen [PSA]: Secondary | ICD-10-CM

## 2017-01-16 NOTE — Telephone Encounter (Signed)
Allison,Nurse with [employer].  She asked for [place] to send the surgical clearance form to her and she'll try to get patient set up with Urgent Care for the surgical clearance.  Do you still have the form?  Form can be faxed to [place] at [phone].  Allison's phone number is [phone].

## 2017-01-16 NOTE — Telephone Encounter (Signed)
The form is no longer in my box.

## 2017-01-16 NOTE — Telephone Encounter (Signed)
I called Peter Rose and let her know Dr.Aron said the form is no longer in her box.

## 2017-01-17 ENCOUNTER — Telehealth: Payer: Self-pay

## 2017-01-17 NOTE — Telephone Encounter (Signed)
Spoke to pt. He said he cannot come here due to his workers comp. They will not pay for him to come here. He is trying to get with the [place] about getting his surgical clearance.

## 2017-01-17 NOTE — Telephone Encounter (Signed)
I am not sure what patient is asking.  How do I place an order for an EKG to be done elsewhere?  I was simply saying that I cannot clear him for surgery without seeing him in an OV that would include an EKG.

## 2017-01-17 NOTE — Telephone Encounter (Signed)
Pt left v/m; pt is scheduled for upcoming knee surgery related to workers comp on 01/29/17. The adjuster with ins co has rejected pts upcoming surgery that had been approved until received order for EKG by Dr Deborra Medina. Pt said he got a call from our office pt needed the EKG.I advised pt I could not find order in pts chart for EKG by Dr Deborra Medina and Dr Deborra Medina is out of office. Pt is scheduled for preop appt on 01/21/17. Pt request cb ASAP.

## 2017-01-18 ENCOUNTER — Ambulatory Visit: Payer: Self-pay | Admitting: Urology

## 2017-01-21 ENCOUNTER — Inpatient Hospital Stay: Admission: RE | Admit: 2017-01-21 | Payer: Commercial Managed Care - PPO | Source: Ambulatory Visit

## 2017-01-24 ENCOUNTER — Other Ambulatory Visit: Payer: Commercial Managed Care - PPO

## 2017-01-25 ENCOUNTER — Inpatient Hospital Stay: Admission: RE | Admit: 2017-01-25 | Payer: Commercial Managed Care - PPO | Source: Ambulatory Visit

## 2017-01-29 ENCOUNTER — Encounter: Admission: RE | Payer: Self-pay | Source: Ambulatory Visit

## 2017-01-29 ENCOUNTER — Ambulatory Visit: Admission: RE | Admit: 2017-01-29 | Payer: Worker's Compensation | Source: Ambulatory Visit | Admitting: Specialist

## 2017-01-29 SURGERY — KNEE ARTHROSCOPY WITH ANTERIOR CRUCIATE LIGAMENT (ACL) REPAIR WITH HAMSTRING GRAFT
Anesthesia: Choice | Laterality: Right

## 2017-03-16 ENCOUNTER — Other Ambulatory Visit: Payer: Self-pay | Admitting: Family Medicine

## 2017-03-19 ENCOUNTER — Other Ambulatory Visit: Payer: Self-pay | Admitting: Family Medicine

## 2017-03-19 NOTE — Telephone Encounter (Signed)
viagra last filled on 12/26/16,

## 2017-07-02 ENCOUNTER — Other Ambulatory Visit: Payer: Self-pay | Admitting: Family Medicine

## 2017-07-09 ENCOUNTER — Ambulatory Visit (INDEPENDENT_AMBULATORY_CARE_PROVIDER_SITE_OTHER): Payer: Self-pay | Admitting: Family Medicine

## 2017-07-09 ENCOUNTER — Encounter: Payer: Self-pay | Admitting: Family Medicine

## 2017-07-09 DIAGNOSIS — R21 Rash and other nonspecific skin eruption: Secondary | ICD-10-CM | POA: Insufficient documentation

## 2017-07-09 NOTE — Progress Notes (Signed)
Subjective:   Patient ID: Peter Rose, male    DOB: 1955/10/20, 61 y.o.   MRN: [id number]  Peter DULUDE is a pleasant 61 y.o. year old male who presents to clinic today with Rash  on 07/09/2017  HPI:  Very itchy rash on his legs and arms shortly after he bought a leather lazy boy recliner.  Has tried wiping it down, changing what he cleans it with. Nothing helps.  Rash is very itchy.  Has not tried anything for it.  Current Outpatient Prescriptions on File Prior to Visit  Medication Sig Dispense Refill  . ALPRAZolam (XANAX) 0.25 MG tablet Take 1 tablet (0.25 mg total) by mouth 2 (two) times daily as needed for anxiety. (Patient taking differently: Take 0.25 mg by mouth 2 (two) times daily as needed for anxiety or sleep. ) 30 tablet 0  . Aspirin-Acetaminophen-Caffeine (GOODY HEADACHE PO) Take 1-2 packets by mouth daily as needed (headache/pain). Depends on level of pain if takes 1-2    . dihydroergotamine (MIGRANAL) 4 MG/ML nasal spray Use one spray in each nostril.  Wait 15 minutes and administer another spray into each nostril for a total of four.  Do not repeat more than once a day. 8 mL 1  . ibuprofen (ADVIL,MOTRIN) 800 MG tablet Take 800 mg by mouth every 8 (eight) hours as needed for moderate pain.   0  . levocetirizine (XYZAL) 5 MG tablet take 1 tablet by mouth once daily every evening 30 tablet 1  . methotrexate (RHEUMATREX) 2.5 MG tablet take 10 tablets by mouth every week 40 tablet 3  . methotrexate (RHEUMATREX) 2.5 MG tablet TAKE 10 TABLETS BY MOUTH EVERY WEEK 40 tablet 0  . sildenafil (VIAGRA) 100 MG tablet take 1 tablet by mouth EVERY 36 HOURS IF NEEDED 8 tablet 0  . traZODone (DESYREL) 50 MG tablet Take 0.5-1 tablets (25-50 mg total) by mouth at bedtime as needed for sleep. 30 tablet 3  . varenicline (CHANTIX STARTING MONTH PAK) 0.5 MG X 11 & 1 MG X 42 tablet Take one 0.5 mg tablet by mouth once daily for 3 days, then increase to one 0.5 mg tablet twice daily for 4  days, then increase to one 1 mg tablet twice daily. (Patient taking differently: Take by mouth See admin instructions. Take one 0.5 mg tablet by mouth once daily for 3 days, then increase to one 0.5 mg tablet twice daily for 4 days, then increase to one 1 mg tablet twice daily.) 53 tablet 0   No current facility-administered medications on file prior to visit.     Allergies  Allergen Reactions  . Codeine Other (See Comments)    feels wired and jittery  . Other     HYCODAN: Makes pt feel weird/jittery  . Vicodin [Hydrocodone-Acetaminophen]     Pt feels "wired" with pain meds    Past Medical History:  Diagnosis Date  . Psoriasis     Past Surgical History:  Procedure Laterality Date  . NO PAST SURGERIES      Family History  Problem Relation Age of Onset  . Headache Neg Hx     Social History   Social History  . Marital status: Single    Spouse name: N/A  . Number of children: 3  . Years of education: 12   Occupational History  . [employer]    Social History Main Topics  . Smoking status: Former Smoker    Packs/day: 1.00    Quit date:  01/06/2017  . Smokeless tobacco: Never Used  . Alcohol use Yes     Comment: occasional  . Drug use: No  . Sexual activity: Not on file   Other Topics Concern  . Not on file   Social History Narrative   Lives alone   Caffeine use:  2 cups - coffee   Tea daily   The PMH, PSH, Social History, Family History, Medications, and allergies have been reviewed in [facility], and have been updated if relevant.  Review of Systems  Respiratory: Negative.   Cardiovascular: Negative.   Skin: Positive for rash.  All other systems reviewed and are negative.      Objective:    BP 124/84 (BP Location: Left Arm, Patient Position: Sitting, Cuff Size: Normal)   Pulse 90   Temp 98.7 F (37.1 C) (Oral)   Wt 228 lb 8 oz (103.6 kg)   SpO2 93%   BMI 32.79 kg/m    Physical Exam  Constitutional: He is oriented to person, place, and time. He  appears well-developed and well-nourished. No distress.  HENT:  Head: Normocephalic and atraumatic.  Eyes: Conjunctivae are normal.  Cardiovascular: Normal rate.   Musculoskeletal: Normal range of motion.  Neurological: He is alert and oriented to person, place, and time. No cranial nerve deficit.  Skin: Rash noted. Rash is urticarial. He is not diaphoretic.  Psychiatric: He has a normal mood and affect. His behavior is normal. Judgment and thought content normal.  Nursing note and vitals reviewed.         Assessment & Plan:   Rash and nonspecific skin eruption No Follow-up on file.

## 2017-07-09 NOTE — Assessment & Plan Note (Signed)
Likely due to material on new chair. Letter written for pt to give to lazy boy company for a refund. The patient indicates understanding of these issues and agrees with the plan.

## 2017-08-28 ENCOUNTER — Telehealth: Payer: Self-pay | Admitting: Family Medicine

## 2017-08-28 NOTE — Telephone Encounter (Signed)
TA-Plz advise if it was right for me to have him schedule a surgical clearance appt? I will call the pt to confirm or cancel after you look at entire message/plz advise/thx dmf

## 2017-08-28 NOTE — Telephone Encounter (Signed)
Yes he does need an appointment for EKG.  Thank you!

## 2017-08-28 NOTE — Telephone Encounter (Signed)
Copied from [place] #[id number]. Topic: Inquiry >> Aug 28, 2017  9:20 AM Bea Graff, NT wrote: Reason for CRM: Patient is needing a medical clearance form signed and sent to his orthopedic doctor so that he can have surgery on 09/10/17. Dr. Deborra Medina is his physician. The orthopedic office he goes to is [facility] in [place]. Fax#: [phone]. Or Pt can pick up clearance form. Attn: Myrtis Hopping if faxed. If form is faxed to please call pt to let him know. And they need the form asap.  >> Aug 28, 2017  9:33 AM Scherrie Gerlach wrote: Pt states the letter needs to address the specific surgery: ACL reconstruction of right knee  Pt was seen 3 wks ago he says  He would need to be seen for EKG and medical clearance- either he can schedule with new provider at [facility] or schedule at [facility]. Thank you! Arnette Norris, MD

## 2017-08-28 NOTE — Telephone Encounter (Signed)
LMOVM for pt to call and sched appt for surgical clearance and stated that can get him in at [facility] as soon as Monday to plz call/thx dmf

## 2017-08-28 NOTE — Telephone Encounter (Signed)
Pt came in to have surgical clearance form filled out. I advised pt he would need an eval before filled out since he's only been seen for acute issue in the past year. He is sch for 11/19 at 9am but if he is not needing appt please call to cancel. I made a copy of his ppw and gave to [facility]. Pt has the original ppw. Please advise if appt is needed.

## 2017-08-29 ENCOUNTER — Other Ambulatory Visit: Payer: Self-pay | Admitting: Orthopedic Surgery

## 2017-09-02 ENCOUNTER — Ambulatory Visit (INDEPENDENT_AMBULATORY_CARE_PROVIDER_SITE_OTHER): Payer: Worker's Compensation | Admitting: Family Medicine

## 2017-09-02 ENCOUNTER — Encounter: Payer: Self-pay | Admitting: Family Medicine

## 2017-09-02 DIAGNOSIS — Z01818 Encounter for other preprocedural examination: Secondary | ICD-10-CM | POA: Insufficient documentation

## 2017-09-02 NOTE — Progress Notes (Signed)
Subjective:    Peter Rose is a 61 y.o. male who presents to the office today for a preoperative consultation at the request of surgeon Dr. Thornton Park who plans on performing right ACL repair on November 27.  Planned anesthesia: general.   He quit smoking!!  Current Outpatient Medications on File Prior to Visit  Medication Sig Dispense Refill  . ibuprofen (ADVIL,MOTRIN) 200 MG tablet Take 400-600 mg every 8 (eight) hours as needed by mouth for headache or moderate pain.   0  . levocetirizine (XYZAL) 5 MG tablet take 1 tablet by mouth once daily every evening 30 tablet 1  . methotrexate (RHEUMATREX) 2.5 MG tablet take 10 tablets by mouth every week (Patient taking differently: Take 25 mg by mouth once weekly on Friday) 40 tablet 3  . sildenafil (VIAGRA) 100 MG tablet take 1 tablet by mouth EVERY 36 HOURS IF NEEDED 8 tablet 0  . traZODone (DESYREL) 50 MG tablet Take 0.5-1 tablets (25-50 mg total) by mouth at bedtime as needed for sleep. 30 tablet 3  . ALPRAZolam (XANAX) 0.25 MG tablet Take 1 tablet (0.25 mg total) by mouth 2 (two) times daily as needed for anxiety. (Patient not taking: Reported on 09/02/2017) 30 tablet 0   No current facility-administered medications on file prior to visit.     Allergies  Allergen Reactions  . Codeine Other (See Comments)    feels wired and jittery  . Other Other (See Comments)    HYCODAN: Makes pt feel weird/jittery  . Vicodin [Hydrocodone-Acetaminophen] Other (See Comments)    Pt feels "wired" with pain meds    Past Medical History:  Diagnosis Date  . Psoriasis     Past Surgical History:  Procedure Laterality Date  . NO PAST SURGERIES      Family History  Problem Relation Age of Onset  . Headache Neg Hx     Social History   Socioeconomic History  . Marital status: Single    Spouse name: Not on file  . Number of children: 3  . Years of education: 49  . Highest education level: Not on file  Social Needs  . Financial  resource strain: Not on file  . Food insecurity - worry: Not on file  . Food insecurity - inability: Not on file  . Transportation needs - medical: Not on file  . Transportation needs - non-medical: Not on file  Occupational History  . Occupation: [employer]  Tobacco Use  . Smoking status: Former Smoker    Packs/day: 1.00    Last attempt to quit: 01/06/2017    Years since quitting: 0.6  . Smokeless tobacco: Never Used  Substance and Sexual Activity  . Alcohol use: Yes    Comment: occasional  . Drug use: No  . Sexual activity: Not on file  Other Topics Concern  . Not on file  Social History Narrative   Lives alone   Caffeine use:  2 cups - coffee   Tea daily   The PMH, PSH, Social History, Family History, Medications, and allergies have been reviewed in [facility], and have been updated if relevant.   Review of Systems Pertinent items are noted in HPI.    Objective:   BP 138/82 (BP Location: Left Arm, Patient Position: Sitting, Cuff Size: Normal)   Pulse 73   Temp 97.7 F (36.5 C) (Oral)   Ht 5\' 10"  (1.778 m)   Wt 231 lb (104.8 kg)   SpO2 97%   BMI 33.15 kg/m  General:  pleasant male in no acute distress Eyes:  PERRL Ears:  External ear exam shows no significant lesions or deformities.  TMs normal bilaterally Hearing is grossly normal bilaterally. Nose:  External nasal examination shows no deformity or inflammation. Nasal mucosa are pink and moist without lesions or exudates. Mouth:  Oral mucosa and oropharynx without lesions or exudates.  Teeth in good repair. Neck:  no carotid bruit or thyromegaly no cervical or supraclavicular lymphadenopathy  Lungs:  Normal respiratory effort, chest expands symmetrically. Lungs are clear to auscultation, no crackles or wheezes. Heart:  Normal rate and regular rhythm. S1 and S2 normal without gallop, murmur, click, rub or other extra sounds. Abdomen:  Bowel sounds positive,abdomen soft and non-tender without masses, organomegaly or  hernias noted. Pulses:  R and L posterior tibial pulses are full and equal bilaterally  Extremities:  no edema  Psych:  Good eye contact, not anxious or depressed appearing   Cardiographics ECG: normal sinus rythm, some non specific t wave changes      Assessment/Plan:      61 y.o. male with planned surgery as above.    Cardiac Risk Estimation: low

## 2017-09-03 ENCOUNTER — Other Ambulatory Visit: Payer: Self-pay | Admitting: Family Medicine

## 2017-09-03 ENCOUNTER — Telehealth: Payer: Self-pay | Admitting: Family Medicine

## 2017-09-03 MED ORDER — METHOTREXATE 2.5 MG PO TABS: ORAL_TABLET | ORAL | 3 refills | Status: DC

## 2017-09-03 NOTE — Telephone Encounter (Signed)
Okay to refill? 

## 2017-09-03 NOTE — Telephone Encounter (Signed)
Yes okay to refill. Thank you 

## 2017-09-03 NOTE — Telephone Encounter (Signed)
Rx sent in

## 2017-09-03 NOTE — Telephone Encounter (Signed)
Copied from [place] [id number]. Topic: Quick Communication - See Telephone Encounter >> Sep 03, 2017 11:47 AM Aurelio Brash B wrote: CRM for notification. See Telephone encounter for: refill methotrexate  [facility]  09/03/17.

## 2017-09-03 NOTE — Telephone Encounter (Signed)
See refill for methotrexate

## 2017-09-03 NOTE — Addendum Note (Signed)
Addended by: Kateri Mc E on: 09/03/2017 03:59 PM   Modules accepted: Orders

## 2017-09-04 ENCOUNTER — Other Ambulatory Visit: Payer: Self-pay

## 2017-09-04 ENCOUNTER — Encounter
Admission: RE | Admit: 2017-09-04 | Discharge: 2017-09-04 | Disposition: A | Discharge status: Home / Self Care | Payer: Self-pay | Source: Ambulatory Visit | Attending: Orthopedic Surgery | Admitting: Orthopedic Surgery

## 2017-09-04 HISTORY — DX: Other specified health status: Z78.9

## 2017-09-04 NOTE — Patient Instructions (Signed)
Your procedure is scheduled on: 09/10/17 Report to Day Surgery. To find out your arrival time please call [phone] between 1PM - 3PM on 09/09/17.  Remember: Instructions that are not followed completely may result in serious medical risk, up to and including death, or upon the discretion of your surgeon and anesthesiologist your surgery may need to be rescheduled.     _X__ 1. Do not eat food after midnight the night before your procedure.                 No gum chewing or hard candies. You may drink clear liquids up to 2 hours                 before you are scheduled to arrive for your surgery- DO not drink clear                 liquids within 2 hours of the start of your surgery.                 Clear Liquids include:  water, apple juice without pulp, clear carbohydrate                 drink such as Clearfast of Gartorade, Black Coffee or Tea (Do not add                 anything to coffee or tea).     _X__ 2.  No Alcohol for 24 hours before or after surgery.   _X__ 3.  Do Not Smoke or use e-cigarettes For 24 Hours Prior to Your Surgery.                 Do not use any chewable tobacco products for at least 6 hours prior to                 surgery.  ____  4.  Bring all medications with you on the day of surgery if instructed.   __X__  5.  Notify your doctor if there is any change in your medical condition      (cold, fever, infections).     Do not wear jewelry, make-up, hairpins, clips or nail polish. Do not wear lotions, powders, or perfumes. You may wear deodorant. Do not shave 48 hours prior to surgery. Men may shave face and neck. Do not bring valuables to the hospital.    [facility] is not responsible for any belongings or valuables.  Contacts, dentures or bridgework may not be worn into surgery. Leave your suitcase in the car. After surgery it may be brought to your room. For patients admitted to the hospital, discharge time is determined by  your treatment team.   Patients discharged the day of surgery will not be allowed to drive home.   Please read over the following fact sheets that you were given:   Surgical Site Infection Prevention          ____ Take these medicines the morning of surgery with A SIP OF WATER:    1. NONE  2.   3.   4.  5.  6.  ____ Fleet Enema (as directed)   __X__ Use CHG Soap as directed  ____ Use inhalers on the day of surgery  ____ Stop metformin 2 days prior to surgery    ____ Take 1/2 of usual insulin dose the night before surgery. No insulin the morning          of surgery.  ____ Stop Coumadin/Plavix/aspirin on   __X__ Stop Anti-inflammatories on  STOP ADVIL UNTIL AFTER SURGERY ____ Stop supplements until after surgery.    ____ Bring C-Pap to the hospital.

## 2017-09-09 ENCOUNTER — Inpatient Hospital Stay
Admission: RE | Admit: 2017-09-09 | Discharge: 2017-09-09 | Disposition: A | Discharge status: Home / Self Care | Payer: Self-pay | Source: Ambulatory Visit

## 2017-09-09 MED ORDER — CEFAZOLIN SODIUM-DEXTROSE 2-4 GM/100ML-% IV SOLN: 2.0000 g | INTRAVENOUS | Status: DC

## 2017-09-10 ENCOUNTER — Encounter
Admission: RE | Disposition: A | Discharge status: Home / Self Care | Payer: Self-pay | Source: Ambulatory Visit | Attending: Orthopedic Surgery

## 2017-09-10 ENCOUNTER — Ambulatory Visit: Payer: Worker's Compensation | Admitting: Registered Nurse

## 2017-09-10 ENCOUNTER — Other Ambulatory Visit: Payer: Self-pay

## 2017-09-10 ENCOUNTER — Observation Stay
Admission: RE | Admit: 2017-09-10 | Discharge: 2017-09-13 | Disposition: A | Discharge status: Home / Self Care | Payer: Worker's Compensation | Source: Ambulatory Visit | Attending: Orthopedic Surgery | Admitting: Orthopedic Surgery

## 2017-09-10 DIAGNOSIS — Z7982 Long term (current) use of aspirin: Secondary | ICD-10-CM | POA: Diagnosis not present

## 2017-09-10 DIAGNOSIS — K760 Fatty (change of) liver, not elsewhere classified: Secondary | ICD-10-CM | POA: Diagnosis not present

## 2017-09-10 DIAGNOSIS — L409 Psoriasis, unspecified: Secondary | ICD-10-CM | POA: Insufficient documentation

## 2017-09-10 DIAGNOSIS — S83241A Other tear of medial meniscus, current injury, right knee, initial encounter: Secondary | ICD-10-CM | POA: Diagnosis not present

## 2017-09-10 DIAGNOSIS — Y990 Civilian activity done for income or pay: Secondary | ICD-10-CM | POA: Insufficient documentation

## 2017-09-10 DIAGNOSIS — Z885 Allergy status to narcotic agent status: Secondary | ICD-10-CM | POA: Insufficient documentation

## 2017-09-10 DIAGNOSIS — S83511A Sprain of anterior cruciate ligament of right knee, initial encounter: Principal | ICD-10-CM | POA: Insufficient documentation

## 2017-09-10 DIAGNOSIS — F17200 Nicotine dependence, unspecified, uncomplicated: Secondary | ICD-10-CM | POA: Diagnosis not present

## 2017-09-10 DIAGNOSIS — X58XXXA Exposure to other specified factors, initial encounter: Secondary | ICD-10-CM | POA: Diagnosis not present

## 2017-09-10 DIAGNOSIS — M25861 Other specified joint disorders, right knee: Secondary | ICD-10-CM | POA: Insufficient documentation

## 2017-09-10 DIAGNOSIS — K8020 Calculus of gallbladder without cholecystitis without obstruction: Secondary | ICD-10-CM | POA: Diagnosis not present

## 2017-09-10 DIAGNOSIS — Z79899 Other long term (current) drug therapy: Secondary | ICD-10-CM | POA: Insufficient documentation

## 2017-09-10 DIAGNOSIS — F419 Anxiety disorder, unspecified: Secondary | ICD-10-CM | POA: Insufficient documentation

## 2017-09-10 DIAGNOSIS — R140 Abdominal distension (gaseous): Secondary | ICD-10-CM

## 2017-09-10 DIAGNOSIS — Z9889 Other specified postprocedural states: Secondary | ICD-10-CM

## 2017-09-10 HISTORY — PX: KNEE ARTHROSCOPY WITH ANTERIOR CRUCIATE LIGAMENT (ACL) REPAIR WITH HAMSTRING GRAFT: SHX5645

## 2017-09-10 LAB — CBC WITH DIFFERENTIAL/PLATELET
BASOS ABS: 0.1 10*3/uL (ref 0–0.1)
BASOS PCT: 1 %
Eosinophils Absolute: 0.2 10*3/uL (ref 0–0.7)
Eosinophils Relative: 4 %
HCT: 45 % (ref 40.0–52.0)
HEMOGLOBIN: 15.9 g/dL (ref 13.0–18.0)
LYMPHS PCT: 27 %
Lymphs Abs: 1.7 10*3/uL (ref 1.0–3.6)
MCH: 33 pg (ref 26.0–34.0)
MCHC: 35.3 g/dL (ref 32.0–36.0)
MCV: 93.5 fL (ref 80.0–100.0)
MONOS PCT: 9 %
Monocytes Absolute: 0.6 10*3/uL (ref 0.2–1.0)
NEUTROS PCT: 59 %
Neutro Abs: 3.8 10*3/uL (ref 1.4–6.5)
Platelets: 241 10*3/uL (ref 150–440)
RBC: 4.81 MIL/uL (ref 4.40–5.90)
RDW: 14.8 % — ABNORMAL HIGH (ref 11.5–14.5)
WBC: 6.3 10*3/uL (ref 3.8–10.6)

## 2017-09-10 LAB — BASIC METABOLIC PANEL
Anion gap: 9 (ref 5–15)
BUN: 17 mg/dL (ref 6–20)
CALCIUM: 9.3 mg/dL (ref 8.9–10.3)
CHLORIDE: 104 mmol/L (ref 101–111)
CO2: 22 mmol/L (ref 22–32)
Creatinine, Ser: 1.44 mg/dL — ABNORMAL HIGH (ref 0.61–1.24)
GFR calc Af Amer: 59 mL/min — ABNORMAL LOW (ref >60)
GFR, EST NON AFRICAN AMERICAN: 51 mL/min — AB (ref >60)
GLUCOSE: 159 mg/dL — AB (ref 65–99)
Potassium: 3.9 mmol/L (ref 3.5–5.1)
SODIUM: 135 mmol/L (ref 135–145)

## 2017-09-10 LAB — APTT: APTT: 35 s (ref 24–36)

## 2017-09-10 LAB — PROTIME-INR
INR: 0.91
Prothrombin Time: 12.2 seconds (ref 11.4–15.2)

## 2017-09-10 SURGERY — KNEE ARTHROSCOPY WITH ANTERIOR CRUCIATE LIGAMENT (ACL) REPAIR WITH HAMSTRING GRAFT
Anesthesia: General | Site: Knee | Laterality: Right | Wound class: Clean

## 2017-09-10 MED ORDER — FAMOTIDINE 20 MG PO TABS
20.0000 mg | ORAL_TABLET | Freq: Once | ORAL | Status: AC
  Administered 2017-09-10: 20 mg via ORAL

## 2017-09-10 MED ORDER — SUCCINYLCHOLINE CHLORIDE 20 MG/ML IJ SOLN
INTRAMUSCULAR | Status: DC | PRN
  Administered 2017-09-10: 100 mg via INTRAVENOUS

## 2017-09-10 MED ORDER — PROPOFOL 10 MG/ML IV BOLUS
INTRAVENOUS | Status: DC | PRN
  Administered 2017-09-10: 160 mg via INTRAVENOUS

## 2017-09-10 MED ORDER — MIDAZOLAM HCL 2 MG/2ML IJ SOLN
INTRAMUSCULAR | Status: DC | PRN
  Administered 2017-09-10: 2 mg via INTRAVENOUS

## 2017-09-10 MED ORDER — LIDOCAINE HCL (CARDIAC) 20 MG/ML IV SOLN
INTRAVENOUS | Status: DC | PRN
  Administered 2017-09-10: 50 mg via INTRAVENOUS

## 2017-09-10 MED ORDER — METHOTREXATE 2.5 MG PO TABS
10.0000 mg | ORAL_TABLET | ORAL | Status: DC
  Administered 2017-09-12: 10 mg via ORAL
  Filled 2017-09-10: qty 4

## 2017-09-10 MED ORDER — MIDAZOLAM HCL 2 MG/2ML IJ SOLN
INTRAMUSCULAR | Status: AC
  Filled 2017-09-10: qty 2

## 2017-09-10 MED ORDER — ACETAMINOPHEN 650 MG RE SUPP: 650.0000 mg | Freq: Four times a day (QID) | RECTAL | Status: DC | PRN

## 2017-09-10 MED ORDER — SENNA 8.6 MG PO TABS
1.0000 | ORAL_TABLET | Freq: Two times a day (BID) | ORAL | Status: DC
  Administered 2017-09-10 – 2017-09-13 (×5): 8.6 mg via ORAL
  Filled 2017-09-10 (×5): qty 1

## 2017-09-10 MED ORDER — CHLORHEXIDINE GLUCONATE CLOTH 2 % EX PADS: 6.0000 | MEDICATED_PAD | Freq: Once | CUTANEOUS | Status: DC

## 2017-09-10 MED ORDER — EPINEPHRINE PF 1 MG/ML IJ SOLN
INTRAMUSCULAR | Status: DC | PRN
  Administered 2017-09-10: 10 mL

## 2017-09-10 MED ORDER — HYDROMORPHONE HCL 1 MG/ML IJ SOLN
INTRAMUSCULAR | Status: DC | PRN
  Administered 2017-09-10: 1 mg via INTRAVENOUS

## 2017-09-10 MED ORDER — FAMOTIDINE 20 MG PO TABS
ORAL_TABLET | ORAL | Status: AC
  Administered 2017-09-10: 20 mg via ORAL
  Filled 2017-09-10: qty 1

## 2017-09-10 MED ORDER — BUPIVACAINE HCL (PF) 0.25 % IJ SOLN
INTRAMUSCULAR | Status: DC | PRN
  Administered 2017-09-10: 20 mL

## 2017-09-10 MED ORDER — ACETAMINOPHEN 325 MG PO TABS: 650.0000 mg | ORAL_TABLET | Freq: Four times a day (QID) | ORAL | Status: DC | PRN

## 2017-09-10 MED ORDER — LEVOCETIRIZINE DIHYDROCHLORIDE 5 MG PO TABS: 5.0000 mg | ORAL_TABLET | Freq: Every evening | ORAL | Status: DC

## 2017-09-10 MED ORDER — POLYETHYLENE GLYCOL 3350 17 G PO PACK
17.0000 g | PACK | Freq: Every day | ORAL | Status: DC | PRN
  Administered 2017-09-11: 17 g via ORAL
  Filled 2017-09-10: qty 1

## 2017-09-10 MED ORDER — PROPOFOL 10 MG/ML IV BOLUS
INTRAVENOUS | Status: AC
  Filled 2017-09-10: qty 20

## 2017-09-10 MED ORDER — ONDANSETRON HCL 4 MG PO TABS: 4.0000 mg | ORAL_TABLET | Freq: Four times a day (QID) | ORAL | Status: DC | PRN

## 2017-09-10 MED ORDER — METHOCARBAMOL 500 MG PO TABS
500.0000 mg | ORAL_TABLET | Freq: Four times a day (QID) | ORAL | Status: DC | PRN
  Administered 2017-09-11: 500 mg via ORAL
  Filled 2017-09-10: qty 1

## 2017-09-10 MED ORDER — FENTANYL CITRATE (PF) 100 MCG/2ML IJ SOLN
INTRAMUSCULAR | Status: DC | PRN
  Administered 2017-09-10 (×2): 50 ug via INTRAVENOUS

## 2017-09-10 MED ORDER — FENTANYL CITRATE (PF) 100 MCG/2ML IJ SOLN
INTRAMUSCULAR | Status: AC
  Filled 2017-09-10: qty 2

## 2017-09-10 MED ORDER — ACETAMINOPHEN 10 MG/ML IV SOLN
INTRAVENOUS | Status: AC
  Filled 2017-09-10: qty 100

## 2017-09-10 MED ORDER — KETOROLAC TROMETHAMINE 30 MG/ML IJ SOLN
INTRAMUSCULAR | Status: DC | PRN
  Administered 2017-09-10: 30 mg via INTRAVENOUS

## 2017-09-10 MED ORDER — ALUM & MAG HYDROXIDE-SIMETH 200-200-20 MG/5ML PO SUSP
30.0000 mL | ORAL | Status: DC | PRN
  Administered 2017-09-10 – 2017-09-12 (×4): 30 mL via ORAL
  Filled 2017-09-10 (×5): qty 30

## 2017-09-10 MED ORDER — DEXAMETHASONE SODIUM PHOSPHATE 10 MG/ML IJ SOLN
INTRAMUSCULAR | Status: DC | PRN
  Administered 2017-09-10: 10 mg via INTRAVENOUS

## 2017-09-10 MED ORDER — ASPIRIN EC 325 MG PO TBEC
325.0000 mg | DELAYED_RELEASE_TABLET | Freq: Every day | ORAL | Status: DC
  Administered 2017-09-11: 325 mg via ORAL
  Filled 2017-09-10: qty 1

## 2017-09-10 MED ORDER — NEOMYCIN-POLYMYXIN B GU 40-200000 IR SOLN
Status: AC
  Filled 2017-09-10: qty 4

## 2017-09-10 MED ORDER — ROCURONIUM BROMIDE 100 MG/10ML IV SOLN
INTRAVENOUS | Status: DC | PRN
  Administered 2017-09-10: 20 mg via INTRAVENOUS
  Administered 2017-09-10: 50 mg via INTRAVENOUS
  Administered 2017-09-10: 30 mg via INTRAVENOUS

## 2017-09-10 MED ORDER — LACTATED RINGERS IV SOLN
INTRAVENOUS | Status: DC
  Administered 2017-09-10 (×3): via INTRAVENOUS

## 2017-09-10 MED ORDER — HYDROMORPHONE HCL 1 MG/ML IJ SOLN
INTRAMUSCULAR | Status: AC
  Administered 2017-09-10: 0.5 mg via INTRAVENOUS
  Filled 2017-09-10: qty 1

## 2017-09-10 MED ORDER — NEOMYCIN-POLYMYXIN B GU 40-200000 IR SOLN
Status: DC | PRN
  Administered 2017-09-10: 4 mL

## 2017-09-10 MED ORDER — HYDROMORPHONE HCL 1 MG/ML IJ SOLN
0.5000 mg | INTRAMUSCULAR | Status: AC | PRN
  Administered 2017-09-10 (×4): 0.5 mg via INTRAVENOUS

## 2017-09-10 MED ORDER — CETIRIZINE HCL 10 MG PO TABS
10.0000 mg | ORAL_TABLET | Freq: Every day | ORAL | Status: DC
  Filled 2017-09-10 (×4): qty 1

## 2017-09-10 MED ORDER — MORPHINE SULFATE (PF) 2 MG/ML IV SOLN
2.0000 mg | INTRAVENOUS | Status: DC | PRN
  Administered 2017-09-11 – 2017-09-12 (×5): 2 mg via INTRAVENOUS
  Filled 2017-09-10 (×6): qty 1

## 2017-09-10 MED ORDER — MAGNESIUM CITRATE PO SOLN
1.0000 | Freq: Once | ORAL | Status: DC | PRN
  Filled 2017-09-10: qty 296

## 2017-09-10 MED ORDER — CEFAZOLIN SODIUM-DEXTROSE 2-3 GM-%(50ML) IV SOLR
INTRAVENOUS | Status: DC | PRN
  Administered 2017-09-10: 2 g via INTRAVENOUS

## 2017-09-10 MED ORDER — BUPIVACAINE HCL (PF) 0.25 % IJ SOLN
INTRAMUSCULAR | Status: AC
  Filled 2017-09-10: qty 30

## 2017-09-10 MED ORDER — FENTANYL CITRATE (PF) 100 MCG/2ML IJ SOLN
INTRAMUSCULAR | Status: AC
  Administered 2017-09-10: 25 ug via INTRAVENOUS
  Filled 2017-09-10: qty 2

## 2017-09-10 MED ORDER — BISACODYL 10 MG RE SUPP
10.0000 mg | Freq: Every day | RECTAL | Status: DC | PRN
  Filled 2017-09-10: qty 1

## 2017-09-10 MED ORDER — EPHEDRINE SULFATE 50 MG/ML IJ SOLN
INTRAMUSCULAR | Status: DC | PRN
  Administered 2017-09-10 (×2): 10 mg via INTRAVENOUS

## 2017-09-10 MED ORDER — METHOCARBAMOL 1000 MG/10ML IJ SOLN
500.0000 mg | Freq: Four times a day (QID) | INTRAVENOUS | Status: DC | PRN
  Filled 2017-09-10: qty 5

## 2017-09-10 MED ORDER — MENTHOL 3 MG MT LOZG
1.0000 | LOZENGE | OROMUCOSAL | Status: DC | PRN
  Filled 2017-09-10: qty 9

## 2017-09-10 MED ORDER — ALPRAZOLAM 0.25 MG PO TABS
0.2500 mg | ORAL_TABLET | Freq: Two times a day (BID) | ORAL | Status: DC | PRN
  Administered 2017-09-12: 0.25 mg via ORAL
  Filled 2017-09-10: qty 1

## 2017-09-10 MED ORDER — SODIUM CHLORIDE 0.9 % IV SOLN
75.0000 mL/h | INTRAVENOUS | Status: DC
  Administered 2017-09-10: 75 mL/h via INTRAVENOUS

## 2017-09-10 MED ORDER — ACETAMINOPHEN 10 MG/ML IV SOLN
INTRAVENOUS | Status: DC | PRN
  Administered 2017-09-10: 1000 mg via INTRAVENOUS

## 2017-09-10 MED ORDER — ONDANSETRON HCL 4 MG/2ML IJ SOLN
INTRAMUSCULAR | Status: DC | PRN
  Administered 2017-09-10: 4 mg via INTRAVENOUS

## 2017-09-10 MED ORDER — TRAZODONE HCL 50 MG PO TABS: 25.0000 mg | ORAL_TABLET | Freq: Every evening | ORAL | Status: DC | PRN

## 2017-09-10 MED ORDER — ONDANSETRON HCL 4 MG/2ML IJ SOLN: 4.0000 mg | Freq: Once | INTRAMUSCULAR | Status: DC | PRN

## 2017-09-10 MED ORDER — LIDOCAINE HCL (PF) 1 % IJ SOLN
INTRAMUSCULAR | Status: AC
  Filled 2017-09-10: qty 30

## 2017-09-10 MED ORDER — EPINEPHRINE 30 MG/30ML IJ SOLN
INTRAMUSCULAR | Status: AC
  Filled 2017-09-10: qty 1

## 2017-09-10 MED ORDER — CEFAZOLIN SODIUM-DEXTROSE 2-4 GM/100ML-% IV SOLN
INTRAVENOUS | Status: AC
  Filled 2017-09-10: qty 100

## 2017-09-10 MED ORDER — PHENOL 1.4 % MT LIQD
1.0000 | OROMUCOSAL | Status: DC | PRN
  Filled 2017-09-10: qty 177

## 2017-09-10 MED ORDER — SUGAMMADEX SODIUM 200 MG/2ML IV SOLN
INTRAVENOUS | Status: DC | PRN
  Administered 2017-09-10: 200 mg via INTRAVENOUS

## 2017-09-10 MED ORDER — LIDOCAINE HCL 1 % IJ SOLN
INTRAMUSCULAR | Status: DC | PRN
  Administered 2017-09-10: 10 mL

## 2017-09-10 MED ORDER — DOCUSATE SODIUM 100 MG PO CAPS
100.0000 mg | ORAL_CAPSULE | Freq: Two times a day (BID) | ORAL | Status: DC
Start: 2017-09-10 — End: 2017-09-13
  Administered 2017-09-10 – 2017-09-13 (×5): 100 mg via ORAL
  Filled 2017-09-10 (×5): qty 1

## 2017-09-10 MED ORDER — OXYCODONE HCL 5 MG PO TABS
5.0000 mg | ORAL_TABLET | ORAL | Status: DC | PRN
  Administered 2017-09-10: 5 mg via ORAL
  Administered 2017-09-10: 10 mg via ORAL
  Administered 2017-09-10 – 2017-09-11 (×2): 5 mg via ORAL
  Administered 2017-09-11 (×3): 10 mg via ORAL
  Administered 2017-09-12: 5 mg via ORAL
  Administered 2017-09-12 – 2017-09-13 (×2): 10 mg via ORAL
  Administered 2017-09-13: 5 mg via ORAL
  Administered 2017-09-13: 10 mg via ORAL
  Filled 2017-09-10 (×2): qty 2
  Filled 2017-09-10: qty 1
  Filled 2017-09-10: qty 2
  Filled 2017-09-10: qty 1
  Filled 2017-09-10: qty 2
  Filled 2017-09-10: qty 1
  Filled 2017-09-10: qty 2
  Filled 2017-09-10: qty 1
  Filled 2017-09-10: qty 2
  Filled 2017-09-10: qty 1
  Filled 2017-09-10: qty 2

## 2017-09-10 MED ORDER — ONDANSETRON HCL 4 MG/2ML IJ SOLN
4.0000 mg | Freq: Four times a day (QID) | INTRAMUSCULAR | Status: DC | PRN
  Administered 2017-09-11: 4 mg via INTRAVENOUS
  Filled 2017-09-10: qty 2

## 2017-09-10 MED ORDER — FERROUS SULFATE 325 (65 FE) MG PO TABS
325.0000 mg | ORAL_TABLET | Freq: Three times a day (TID) | ORAL | Status: DC
  Administered 2017-09-10 – 2017-09-13 (×5): 325 mg via ORAL
  Filled 2017-09-10 (×6): qty 1

## 2017-09-10 MED ORDER — DEXTROSE 5 % IV SOLN
1.0000 g | Freq: Four times a day (QID) | INTRAVENOUS | Status: AC
  Administered 2017-09-10 (×2): 1 g via INTRAVENOUS
  Filled 2017-09-10 (×2): qty 10

## 2017-09-10 MED ORDER — FENTANYL CITRATE (PF) 100 MCG/2ML IJ SOLN
25.0000 ug | INTRAMUSCULAR | Status: DC | PRN
  Administered 2017-09-10 (×4): 25 ug via INTRAVENOUS

## 2017-09-10 SURGICAL SUPPLY — 101 items
ADAPTER IRRIG TUBE 2 SPIKE SOL (ADAPTER) ×6 IMPLANT
ANCHOR BUTTON TIGHTROPE ACL RT (Orthopedic Implant) ×3 IMPLANT
ANCHOR SUPER #2 ORTHOCORD (MISCELLANEOUS) IMPLANT
ARTHREX ACL GUIDE LOANER FEE (INSTRUMENTS) ×3
BASIN GRAD PLASTIC 32OZ STRL (MISCELLANEOUS) ×3 IMPLANT
BIT DRILL PIN RETRO (DRILL) ×1 IMPLANT
BLADE SURG 15 STRL LF DISP TIS (BLADE) ×1 IMPLANT
BLADE SURG 15 STRL SS (BLADE) ×2
BLADE SURG SZ11 CARB STEEL (BLADE) ×3 IMPLANT
BNDG COHESIVE 4X5 TAN STRL (GAUZE/BANDAGES/DRESSINGS) ×3 IMPLANT
BNDG COHESIVE 6X5 TAN STRL LF (GAUZE/BANDAGES/DRESSINGS) ×3 IMPLANT
BNDG ESMARK 6X12 TAN STRL LF (GAUZE/BANDAGES/DRESSINGS) IMPLANT
BRACE KNEE POST OP SHORT (BRACE) ×3 IMPLANT
BUR RADIUS 3.5 (BURR) ×3 IMPLANT
BUR RADIUS 4.0X18.5 (BURR) ×3 IMPLANT
BUR RADIUS 5.5 (BURR) ×3 IMPLANT
CLEANER CAUTERY TIP 5X5 PAD (MISCELLANEOUS) ×1 IMPLANT
CLOSURE WOUND 1/2 X4 (GAUZE/BANDAGES/DRESSINGS) ×2
COOLER POLAR GLACIER W/PUMP (MISCELLANEOUS) ×3 IMPLANT
CUFF TOURN 24 STER (MISCELLANEOUS) IMPLANT
CUFF TOURN 30 STER DUAL PORT (MISCELLANEOUS) IMPLANT
CUTTER DUAL RETRO 10.5 (CUTTER) ×3 IMPLANT
CUTTER REFRO DUAL 10MM (CUTTER) ×3 IMPLANT
DEVICE SUCT BLK HOLE OR FLOOR (MISCELLANEOUS) ×3 IMPLANT
DRAPE FLUOR MINI C-ARM 54X84 (DRAPES) ×3 IMPLANT
DRAPE IMP U-DRAPE 54X76 (DRAPES) ×6 IMPLANT
DRAPE INCISE IOBAN 66X45 STRL (DRAPES) IMPLANT
DRAPE POUCH INSTRU U-SHP 10X18 (DRAPES) ×3 IMPLANT
DRAPE SHEET LG 3/4 BI-LAMINATE (DRAPES) ×6 IMPLANT
DRAPE TABLE BACK 80X90 (DRAPES) ×3 IMPLANT
DRAPE U-SHAPE 47X51 STRL (DRAPES) ×3 IMPLANT
DRILL FLIPCUTTER II 9.0MM (INSTRUMENTS) ×1 IMPLANT
DRILL PIN RETRO (DRILL) ×3
DURAPREP 26ML APPLICATOR (WOUND CARE) ×9 IMPLANT
ELECT REM PT RETURN 9FT ADLT (ELECTROSURGICAL) ×3
ELECTRODE REM PT RTRN 9FT ADLT (ELECTROSURGICAL) ×1 IMPLANT
FEE LOANER ACL GUIDE ARTHREX (INSTRUMENTS) ×1 IMPLANT
FLIPCUTTER II 9.0MM (INSTRUMENTS) ×3
GAUZE PETRO XEROFOAM 1X8 (MISCELLANEOUS) ×3 IMPLANT
GAUZE SPONGE 4X4 12PLY STRL (GAUZE/BANDAGES/DRESSINGS) ×3 IMPLANT
GLOVE BIOGEL PI IND STRL 7.5 (GLOVE) ×8 IMPLANT
GLOVE BIOGEL PI IND STRL 9 (GLOVE) ×1 IMPLANT
GLOVE BIOGEL PI INDICATOR 7.5 (GLOVE) ×16
GLOVE BIOGEL PI INDICATOR 9 (GLOVE) ×2
GLOVE SURG 9.0 ORTHO LTXF (GLOVE) ×6 IMPLANT
GOWN STRL REUS TWL 2XL XL LVL4 (GOWN DISPOSABLE) ×3 IMPLANT
GOWN STRL REUS W/ TWL LRG LVL3 (GOWN DISPOSABLE) ×4 IMPLANT
GOWN STRL REUS W/TWL LRG LVL3 (GOWN DISPOSABLE) ×8
GRADUATE 1200CC STRL 31836 (MISCELLANEOUS) ×3 IMPLANT
GUIDEWIRE 1.2MMX18 (WIRE) ×3 IMPLANT
HANDLE YANKAUER SUCT BULB TIP (MISCELLANEOUS) ×3 IMPLANT
IV LACTATED RINGER IRRG 3000ML (IV SOLUTION) ×20
IV LR IRRIG 3000ML ARTHROMATIC (IV SOLUTION) ×10 IMPLANT
KIT RM TURNOVER STRD PROC AR (KITS) ×3 IMPLANT
LABEL OR SOLS (LABEL) ×3 IMPLANT
MAT BLUE FLOOR 46X72 FLO (MISCELLANEOUS) ×6 IMPLANT
NDL SAFETY ECLIPSE 18X1.5 (NEEDLE) ×1 IMPLANT
NEEDLE FILTER BLUNT 18X 1/2SAF (NEEDLE) ×2
NEEDLE FILTER BLUNT 18X1 1/2 (NEEDLE) ×1 IMPLANT
NEEDLE HYPO 18GX1.5 SHARP (NEEDLE) ×2
NEPTUNE MANIFOLD (MISCELLANEOUS) ×3 IMPLANT
PACK ARTHROSCOPY KNEE (MISCELLANEOUS) ×3 IMPLANT
PAD ABD DERMACEA PRESS 5X9 (GAUZE/BANDAGES/DRESSINGS) ×6 IMPLANT
PAD CAST CTTN 4X4 STRL (SOFTGOODS) ×1 IMPLANT
PAD CLEANER CAUTERY TIP 5X5 (MISCELLANEOUS) ×2
PAD WRAPON POLAR KNEE (MISCELLANEOUS) ×1 IMPLANT
PADDING CAST COTTON 4X4 STRL (SOFTGOODS) ×2
PENCIL ELECTRO HAND CTR (MISCELLANEOUS) ×3 IMPLANT
SCREW INTERFERENCE CANN 11X28M (Screw) ×3 IMPLANT
SET TUBE SUCT SHAVER OUTFL 24K (TUBING) ×3 IMPLANT
SET TUBE TIP INTRA-ARTICULAR (MISCELLANEOUS) ×3 IMPLANT
SPONGE LAP 18X18 5 PK (GAUZE/BANDAGES/DRESSINGS) ×3 IMPLANT
STAPLE SPIKE LIGAMENT 11X20MM (Staple) ×3 IMPLANT
STRIP CLOSURE SKIN 1/2X4 (GAUZE/BANDAGES/DRESSINGS) ×4 IMPLANT
SUCTION FRAZIER HANDLE 10FR (MISCELLANEOUS) ×2
SUCTION TUBE FRAZIER 10FR DISP (MISCELLANEOUS) ×1 IMPLANT
SUT 2 FIBERLOOP 20 STRT BLUE (SUTURE) ×12
SUT ETHILON 4-0 (SUTURE) ×2
SUT ETHILON 4-0 FS2 18XMFL BLK (SUTURE) ×1
SUT FIBERSNARE 2 CLSD LOOP (SUTURE) ×3 IMPLANT
SUT FIBERWIRE #2 38 T-5 BLUE (SUTURE) ×6
SUT MNCRL AB 4-0 PS2 18 (SUTURE) ×3 IMPLANT
SUT ORTHOCORD 2X36 W/O NDL (SUTURE) IMPLANT
SUT VIC AB 0 CT1 36 (SUTURE) ×3 IMPLANT
SUT VIC AB 2-0 CT2 27 (SUTURE) IMPLANT
SUT VIC AB 2-0 SH 27 (SUTURE) ×2
SUT VIC AB 2-0 SH 27XBRD (SUTURE) ×1 IMPLANT
SUTURE 2 FIBERLOOP 20 STRT BLU (SUTURE) ×4 IMPLANT
SUTURE ETHLN 4-0 FS2 18XMF BLK (SUTURE) ×1 IMPLANT
SUTURE FIBERWR #2 38 T-5 BLUE (SUTURE) ×2 IMPLANT
SYR 10ML LL (SYRINGE) ×6 IMPLANT
SYR 20CC LL (SYRINGE) ×3 IMPLANT
SYR BULB IRRIG 60ML STRL (SYRINGE) ×3 IMPLANT
TAPE UMBIL 1/8X18 RADIOPA (MISCELLANEOUS) ×3 IMPLANT
TENDON SEMI-TENDINOSUS (Bone Implant) ×3 IMPLANT
TOWEL OR 17X26 4PK STRL BLUE (TOWEL DISPOSABLE) ×6 IMPLANT
TUBING ARTHRO INFLOW-ONLY STRL (TUBING) ×3 IMPLANT
TUBING CONNECTING 10 (TUBING) IMPLANT
TUBING CONNECTING 10' (TUBING)
WAND HAND CNTRL MULTIVAC 90 (MISCELLANEOUS) ×3 IMPLANT
WRAPON POLAR PAD KNEE (MISCELLANEOUS) ×3

## 2017-09-10 NOTE — Progress Notes (Signed)
Pt not able to void. Bladder scan showed 646 ml. Dr Phineas Real  Paged and made aware. This nurse received verbal orders for 1 time In & out straight catheter.

## 2017-09-10 NOTE — Anesthesia Post-op Follow-up Note (Signed)
Anesthesia QCDR form completed.        

## 2017-09-10 NOTE — OR Nursing (Signed)
Dr Mack Guise okay to to proceed to OR without lab results, Dr Andree Elk aware as weel.

## 2017-09-10 NOTE — Op Note (Signed)
09/10/2017  12:59 PM  PATIENT:  Peter Rose    PRE-OPERATIVE DIAGNOSIS:  Right knee anterior cruciate ligament tear and medial meniscus tear  POST-OPERATIVE DIAGNOSIS:  Same  PROCEDURE:  RIGHT KNEE ARTHROSCOPY WITH ANTERIOR CRUCIATE LIGAMENT RECONSTRUCTION WITH HYBRID HAMSTRING AUTOGRAFT AND  ALLOGRAFT & PARTIAL MEDIAL MENISCECTOMY  SURGEON:  Thornton Park, MD  ANESTHESIA:   General  PREOPERATIVE INDICATIONS:  Peter Rose is a  61 y.o. male with a diagnosis of right knee anterior cruciate ligament tear and medial meniscus tear who failed conservative measures and elected for surgical management.    The risks benefits and alternatives were discussed with the patient preoperatively including but not limited to the risks of infection, bleeding, nerve or blood vessel injury, knee stiffness/arthrofibrosis, hardware failure, re-tear of the anterior cruciate ligament graft, persistent pain or instability, osteoarthritis and the need for revision surgery.  Medical risks include but are not limited to DVT and pulmonary embolism, stroke, pneumonia, respiratory failure and death. Patient understood these risks and wished to proceed with surgical reconstruction.   OPERATIVE IMPLANTS: Arthrex anterior cruciate ligament tightrope RC, Arthrex biocomposite 11 x 28 mm tibial interference screw and 11 mm spiked staple.  OPERATIVE PROCEDURE: Patient was in the preoperative area. The right knee was marked with the word yes according the hospital's correct site of surgery protocol. The patient was brought to the operating room and placed in the supine position. General anesthesia was administered. The 2 grams of ancef was given for antibiotic prophylaxis. The lower extremity was prepped and draped in usual sterile fashion.   A time out was performed to verify the patient's name, date of birth, medical record number, correct site of surgery correct procedure to be performed. It was also used to verify  the patient received antibiotics and all appropriate instruments, implants and radiographs studies were available in the room. Once all in attendance were in agreement case began. A tourniquet was applied to the right upper thigh but was not inflated.  Exam under anesthesia was performed which demonstrated anterior laxity of 10 mm on Lachman's and Anterior drawer testing and a positive pivot shift   Patient had no instability to varus valgus stress testing at 0 and 30 of flexion. His knee range of motion was from 0 120. He did not have a significant effusion.   Proposed arthroscopy incisions were drawn out with a surgical marker and pre-injected with 1% lidocaine plain. An 11 blade was used to establish an inferior medial and lateral portals. The medial portal was created under direct visualization using an 18-gauge spinal needle for localization. A full diagnostic examination of the knee was performed including the suprapatellar pouch, the patella femoral joint, medial lateral gutters, the medial and lateral compartments, the intercondylar notch in the posterior knee. Patient was found to have a complex tear involving the posterior horn of the meniscus with a radial component extending into the meniscal root.  Patient had the medial meniscal tear treated with a 4-0 resector shaver blade and straight and upbiting duckbill baskets. The meniscus was debrided until a stable rim was achieved as determined with a probe.  The patient had fissuring of the medial femoral condyle and marginal wear of the articular cartilage of the medal aspect of the medial femoral condyle that was not amendable to microfracture, as it was not well shouldered..   The anterior cruciate ligament fibers were debrided with a 4.0 resector shaver blade. A 5.5 resector shaver blade was used perform a notchplasty.  Once the intercondylar notch was prepped the attention was turned to harvesting the hamstring autografts.  A longitudinal  incision was made over the anteromedial proximal tibia. The sartorius fascia was incised with a 15 blade and reflected to reveal the underlying gracilis and semitendinosus. These were harvested using a tendon stripper. The gracilis was deemed to be too diminutive to use as a graft and a semitendinosis allograft was substituted for the patient's gracilis.  Both the semitendinosis auto and allografts were prepared on the back table. The graft was measured to be 9 mm on the femoral side and 10 mm on the tibial side. The length of the graft was 120 mm when doubled over. The graft was placed on the Graftmaster table under 15 mmHg of tension and kept moist on the back table until implantation.   The attention was then turned to tunnel creation. The femoral tunnel cutting guide was then placed through the lateral portal. The arthroscope was placed in the medial portal at this point. The intercondylar distance was measured at 40 mm at. A flip cutter drill guide was advanced into the intercondylar notch. The blade was engaged and the femoral tunnel was created in a retrograde fashion to 30 mm. A fiber stick suture was placed through the femoral tunnel brought out the lateral portal and clamped for later graft passage.   The attention was then turned to tibial tunnel creation. This was done with a fixed angle tibial retro-drill guide. A drill pin was inserted through the anterior tibia and advanced until it engaged the 10 mm drill bit. A tibial tunnel was then created in a retrograde fashion. The fiber stick was brought out through the tibial tunnel. The 4 stranded hamstring tibial autograft was then shuttled through the knee using the fiber stick graft. Once the button was flipped on the lateral femoral cortex FluoroScan image was taken to confirm it was laying flat against the lateral cortex of the femur. Once this was confirmed the hamstring graft was advanced into position using the white suture ends of the Arthrex  tight rope RC button. The graft was bottomed out into the femoral tunnel. The knee was then cycled 25 times to remove creep. The knee was then flexed approximately 30. An Arthrex bio composite interference screw 11 x 28 mm was then advanced into position with countertraction on the tibial side of the graft and a posterior drawer force directed to the tibia. Once the interference screw was in position, an 11 mm spiked staple was placed over the distal end of the graft on the tibial side as backup fixation.  The patient had a firm endpoint without anterior laxity on Lachman's test. The range of motion was 0-120. Final arthroscopic images of the graft were taken. There was no graft impingement in full extension. The wounds were copiously irrigated. The deep fascia of the anterior tibial incision was closed with interrupted 0 Vicryl.  The of the tibial incision subcutaneous tissue was closed with a 2-0 Vicryl and the skin was approximated with a running 4-0 Monocryl. The arthroscopy portal incisions were closed with 4-0 nylon along with the small stab incision over the lateral femur used for placement of the femoral tunnel.  Patient had a dry sterile dressing applied along with Steri-Strips and Xeroform. The incisions and the joint were injected with 0.25% Marcaine plain.  Patient had a Polar Care sleeve along with a hinged knee brace locked in extension. The patient was brought to the PACU in stable condition.  I was scrubbed and present the entire case and all sharp and instrument counts were correct at conclusion the case. ..  The patient was awakened and brought to PACU in stable condition. I spoke with the patient's family member in the postop consultation room to let her know that patient was stable in recovery room the case had been performed without complication.

## 2017-09-10 NOTE — H&P (Signed)
The patient has been re-examined, and the chart reviewed, and there have been no interval changes to the documented history and physical.    The risks, benefits, and alternatives have been discussed at length, and the patient is willing to proceed.   

## 2017-09-10 NOTE — Progress Notes (Signed)
Patient was able to void in bathroom, no need to in/out catheter

## 2017-09-10 NOTE — Anesthesia Preprocedure Evaluation (Addendum)
Anesthesia Evaluation  Patient identified by MRN, date of birth, ID band Patient awake    Reviewed: Allergy & Precautions, H&P , NPO status , Patient's Chart, lab work & pertinent test results, reviewed documented beta blocker date and time   Airway Mallampati: II  TM Distance: >3 FB Neck ROM: full    Dental  (+) Teeth Intact   Pulmonary neg pulmonary ROS, former smoker,    Pulmonary exam normal        Cardiovascular Exercise Tolerance: Good negative cardio ROS Normal cardiovascular exam Rhythm:regular Rate:Normal     Neuro/Psych negative neurological ROS  negative psych ROS   GI/Hepatic negative GI ROS, Neg liver ROS,   Endo/Other  negative endocrine ROS  Renal/GU negative Renal ROS  negative genitourinary   Musculoskeletal   Abdominal   Peds  Hematology negative hematology ROS (+)   Anesthesia Other Findings Past Medical History: No date: Medical history non-contributory No date: Psoriasis Past Surgical History: No date: NO PAST SURGERIES BMI    Body Mass Index:  33.15 kg/m     Reproductive/Obstetrics negative OB ROS                             Anesthesia Physical Anesthesia Plan  ASA: II  Anesthesia Plan: General ETT   Post-op Pain Management:    Induction:   PONV Risk Score and Plan: 3  Airway Management Planned: Oral ETT  Additional Equipment:   Intra-op Plan:   Post-operative Plan:   Informed Consent: I have reviewed the patients History and Physical, chart, labs and discussed the procedure including the risks, benefits and alternatives for the proposed anesthesia with the patient or authorized representative who has indicated his/her understanding and acceptance.   Dental Advisory Given  Plan Discussed with: CRNA  Anesthesia Plan Comments: (Discussed options regarding femoral nerve block and refused by patient. JA)       Anesthesia Quick  Evaluation

## 2017-09-10 NOTE — Transfer of Care (Signed)
Immediate Anesthesia Transfer of Care Note  Patient: Peter Rose  Procedure(s) Performed: KNEE ARTHROSCOPY WITH ANTERIOR CRUCIATE LIGAMENT (ACL) REPAIR WITH HAMSTRING GRAFT AND  ALLOGRAFT ,& MEDIAL MENISCECTOMY (Right Knee)  Patient Location: PACU  Anesthesia Type:General  Level of Consciousness: awake, alert  and oriented  Airway & Oxygen Therapy: Patient Spontanous Breathing and Patient connected to face mask oxygen  Post-op Assessment: Report given to RN  Post vital signs: Reviewed and stable  Last Vitals:  Vitals:   09/10/17 0606 09/10/17 0626  BP:  (!) 160/100  Pulse: (!) 102   Resp: 16   Temp: 36.6 C   SpO2: 96%     Last Pain:  Vitals:   09/10/17 0606  TempSrc: Tympanic  PainSc: 3          Complications: No apparent anesthesia complications

## 2017-09-10 NOTE — Anesthesia Procedure Notes (Signed)
Procedure Name: Intubation Date/Time: 09/10/2017 8:02 AM Performed by: Lesle Reek, CRNA Pre-anesthesia Checklist: Timeout performed, Patient being monitored, Suction available, Emergency Drugs available and Patient identified Patient Re-evaluated:Patient Re-evaluated prior to induction Oxygen Delivery Method: Circle system utilized Preoxygenation: Pre-oxygenation with 100% oxygen Induction Type: IV induction Laryngoscope Size: Mac and 4 Grade View: Grade II Tube type: Oral Tube size: 7.5 mm Number of attempts: 1 Airway Equipment and Method: Stylet Placement Confirmation: ETT inserted through vocal cords under direct vision,  breath sounds checked- equal and bilateral,  CO2 detector and positive ETCO2 Secured at: 23 cm Tube secured with: Tape

## 2017-09-11 ENCOUNTER — Observation Stay: Payer: Worker's Compensation

## 2017-09-11 ENCOUNTER — Encounter: Payer: Self-pay | Admitting: Orthopedic Surgery

## 2017-09-11 DIAGNOSIS — S83511A Sprain of anterior cruciate ligament of right knee, initial encounter: Secondary | ICD-10-CM | POA: Diagnosis not present

## 2017-09-11 DIAGNOSIS — R140 Abdominal distension (gaseous): Secondary | ICD-10-CM

## 2017-09-11 DIAGNOSIS — R109 Unspecified abdominal pain: Secondary | ICD-10-CM | POA: Diagnosis not present

## 2017-09-11 LAB — CBC
HEMATOCRIT: 37.9 % — AB (ref 40.0–52.0)
Hemoglobin: 13 g/dL (ref 13.0–18.0)
MCH: 32.5 pg (ref 26.0–34.0)
MCHC: 34.3 g/dL (ref 32.0–36.0)
MCV: 94.7 fL (ref 80.0–100.0)
PLATELETS: 196 10*3/uL (ref 150–440)
RBC: 4.01 MIL/uL — AB (ref 4.40–5.90)
RDW: 14.7 % — AB (ref 11.5–14.5)
WBC: 9.7 10*3/uL (ref 3.8–10.6)

## 2017-09-11 LAB — BASIC METABOLIC PANEL
ANION GAP: 8 (ref 5–15)
BUN: 19 mg/dL (ref 6–20)
CALCIUM: 9 mg/dL (ref 8.9–10.3)
CO2: 26 mmol/L (ref 22–32)
Chloride: 101 mmol/L (ref 101–111)
Creatinine, Ser: 1.14 mg/dL (ref 0.61–1.24)
Glucose, Bld: 122 mg/dL — ABNORMAL HIGH (ref 65–99)
POTASSIUM: 4.4 mmol/L (ref 3.5–5.1)
Sodium: 135 mmol/L (ref 135–145)

## 2017-09-11 LAB — MAGNESIUM: MAGNESIUM: 2 mg/dL (ref 1.7–2.4)

## 2017-09-11 MED ORDER — FLEET ENEMA 7-19 GM/118ML RE ENEM
1.0000 | ENEMA | Freq: Once | RECTAL | Status: AC
  Administered 2017-09-11: 1 via RECTAL

## 2017-09-11 MED ORDER — ENOXAPARIN SODIUM 40 MG/0.4ML ~~LOC~~ SOLN
40.0000 mg | SUBCUTANEOUS | Status: DC
  Administered 2017-09-11 – 2017-09-12 (×2): 40 mg via SUBCUTANEOUS
  Filled 2017-09-11 (×2): qty 0.4

## 2017-09-11 MED ORDER — CHLORPROMAZINE HCL 25 MG PO TABS
25.0000 mg | ORAL_TABLET | Freq: Four times a day (QID) | ORAL | Status: DC | PRN
  Administered 2017-09-11 – 2017-09-12 (×3): 25 mg via ORAL
  Filled 2017-09-11 (×5): qty 1

## 2017-09-11 MED ORDER — IOPAMIDOL (ISOVUE-300) INJECTION 61%: 30.0000 mL | Freq: Once | INTRAVENOUS | Status: DC | PRN

## 2017-09-11 NOTE — Plan of Care (Signed)
  Progressing Education: Knowledge of General Education information will improve 09/11/2017 0609 - Progressing by Mackinzie Vuncannon, Lucille Passy, RN Health Behavior/Discharge Planning: Ability to manage health-related needs will improve 09/11/2017 0609 - Progressing by Anastacio Bua, Lucille Passy, RN Clinical Measurements: Ability to maintain clinical measurements within normal limits will improve 09/11/2017 0609 - Progressing by Adysen Raphael, Lucille Passy, RN Will remain free from infection 09/11/2017 0609 - Progressing by Dhruv Christina, Lucille Passy, RN Diagnostic test results will improve 09/11/2017 [phone] - Progressing by Bulmaro Feagans, Lucille Passy, RN Respiratory complications will improve 09/11/2017 0609 - Progressing by Bryna Colander, RN Cardiovascular complication will be avoided 09/11/2017 0609 - Progressing by Natsuko Kelsay, Lucille Passy, RN Activity: Risk for activity intolerance will decrease 09/11/2017 0609 - Progressing by Bryna Colander, RN Nutrition: Adequate nutrition will be maintained 09/11/2017 0609 - Progressing by Bryna Colander, RN Coping: Level of anxiety will decrease 09/11/2017 0609 - Progressing by Bryna Colander, RN Elimination: Will not experience complications related to bowel motility 09/11/2017 0609 - Progressing by Bryna Colander, RN Will not experience complications related to urinary retention 09/11/2017 0609 - Progressing by Jesilyn Easom, Lucille Passy, RN Pain Managment: General experience of comfort will improve 09/11/2017 0609 - Progressing by Julien Berryman, Lucille Passy, RN Safety: Ability to remain free from injury will improve 09/11/2017 0609 - Progressing by Litzi Binning, Lucille Passy, RN Skin Integrity: Risk for impaired skin integrity will decrease 09/11/2017 0609 - Progressing by Ann Bohne, Lucille Passy, RN

## 2017-09-11 NOTE — Care Management Note (Signed)
Case Management Note  Patient Details  Name: Peter Rose MRN: [id number] Date of Birth: 11/09/1955  Subjective/Objective:   Case discussed with Dr. Mack Guise. He states patient will need home health PT. He will discharge home on ASA.  Met with patient to discuss discharge plan. Patient agreeable to home health PT. Will need a walker. Ordered from [facility]. He is open to any home health agency. Referral to [facility] for [place], [place].   TC to Myrtis Hopping at Dr. Harden Mo office. Patient states he was under the impression that she had already set up home health last week. LM for return call.                Action/Plan: Following progression.   Expected Discharge Date:  09/11/17               Expected Discharge Plan:  [place]  In-House Referral:     Discharge planning Services  CM Consult  Post Acute Care Choice:  Durable Medical Equipment Choice offered to:  Patient  DME Arranged:  Walker rolling DME Agency:  [place]:  PT [facility] Agency:  [place]  Status of Service:  In process, will continue to follow  If discussed at Long Length of Stay Meetings, dates discussed:    Additional Comments:  Jolly Mango, RN 09/11/2017, 4:10 PM

## 2017-09-11 NOTE — Progress Notes (Signed)
Physical Therapy Treatment Patient Details Name: Peter Rose MRN: [id number] DOB: 03/21/1956 Today's Date: 09/11/2017    History of Present Illness Pt. is a 61 y.o. male who was admitted to [facility] for  ACL reconstruction of the right knee. Pt. sustained the knee injury after falling out of a crane. Pt. also sustained a right elbow injury, and anticipates having to have it surgically repaired when he has recovered from the knee surgery. Pt. PMHx includes: Psoriasis.    PT Comments    Marked improvement in pain control and overall performance, activity tolerance this date.  Demonstrates gait x120' with RW, cga/close sup; good control and ability to maintain WBing restrictions of R LE. Much more comfortable and confident with movement pattern this PM; will continue progression next date.    Follow Up Recommendations  DC plan and follow up therapy as arranged by surgeon;Home health PT     Equipment Recommendations  Rolling walker with 5" wheels;Other (comment)(tub transfer bench)    Recommendations for Other Services       Precautions / Restrictions Precautions Precautions: Fall Required Braces or Orthoses: Knee Immobilizer - Right Knee Immobilizer - Right: On at all times Restrictions Weight Bearing Restrictions: Yes RLE Weight Bearing: Non weight bearing Other Position/Activity Restrictions: per telephone clarification with Dr. Mack Guise, patient okay for toe touch WBing for balance, but does not wish for patient to put weight of body/load leg    Mobility  Bed Mobility Overal bed mobility: Modified Independent                Transfers Overall transfer level: Needs assistance Equipment used: Rolling walker (2 wheeled) Transfers: Sit to/from Stand Sit to Stand: Supervision         General transfer comment: good hand placement, good strength/control of L LE with movement transition  Ambulation/Gait Ambulation/Gait assistance: Supervision;Min guard Ambulation  Distance (Feet): 130 Feet Assistive device: Rolling walker (2 wheeled)       General Gait Details: improved comfort/confidence in gait performance this PM; good UB strength/control, good balance on L LE   Stairs Stairs: (none required for discharge)          Wheelchair Mobility    Modified Rankin (Stroke Patients Only)       Balance Overall balance assessment: Needs assistance Sitting-balance support: No upper extremity supported;Feet supported Sitting balance-Leahy Scale: Good     Standing balance support: Bilateral upper extremity supported Standing balance-Leahy Scale: Fair                              Cognition Arousal/Alertness: Awake/alert Behavior During Therapy: WFL for tasks assessed/performed Overall Cognitive Status: Within Functional Limits for tasks assessed                                        Exercises Other Exercises Other Exercises: Upper and lower body dressing, min assist to thread pants/undergarments over R LE; sit/stand and standing balance to pull lower body clothing over hips, cga/close sup. Other Exercises: Reviewed use of knee immobilizer (on at all times) and WBing restrictions; patient voiced agreement.  To discuss progression of both with MD during rounds.    General Comments        Pertinent Vitals/Pain Pain Assessment: 0-10 Pain Score: 2  Pain Location: R k nee Pain Descriptors / Indicators: Aching;Grimacing;Guarding Pain Intervention(s): Limited activity within  patient's tolerance;Monitored during session;Repositioned    Home Living Family/patient expects to be discharged to:: Private residence Living Arrangements: Alone Available Help at Discharge: Neighbor Type of Home: Apartment Home Access: Level entry   [place]: One level        Prior Function Level of Independence: Independent    ADL's / [place] Needed: Pt. 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     PT Goals (current goals can now be found in the care plan section) Acute Rehab PT Goals Patient Stated Goal: To be able to return to work without pain PT Goal Formulation: With patient Time For Goal Achievement: 09/25/17 Potential to Achieve Goals: Good Progress towards PT goals: Progressing toward goals    Frequency    BID      PT Plan Current plan remains appropriate    Co-evaluation              AM-PAC PT "6 Clicks" Daily Activity  Outcome Measure  Difficulty turning over in bed (including adjusting bedclothes, sheets and blankets)?: None Difficulty moving from lying on back to sitting on the side of the bed? : None Difficulty sitting down on and standing up from a chair with arms (e.g., wheelchair, bedside commode, etc,.)?: A Little Help needed moving to and from a bed to chair (including a wheelchair)?: A Little Help needed walking in hospital room?: A Little Help needed climbing 3-5 steps with a railing? : A Little 6 Click Score: 20    End of Session Equipment Utilized During Treatment: Gait belt Activity Tolerance: Patient tolerated treatment well Patient left: in bed;with call bell/phone within reach Nurse Communication: Mobility status PT Visit Diagnosis: Difficulty in walking, not elsewhere classified (R26.2);Muscle weakness (generalized) (M62.81);Pain Pain - Right/Left: Right Pain - part of body: Knee     Time: 7517-0017 PT Time Calculation (min) (ACUTE ONLY): 18 min  Charges:  $Gait Training: 8-22 mins $Therapeutic Activity: 8-22 mins                    G Codes:  Functional Assessment Tool Used: AM-PAC 6 Clicks Basic Mobility Functional Limitation: Mobility: Walking and moving around Mobility: Walking and Moving Around Current Status (C9449): At least 20 percent but less than 40 percent impaired, limited or  restricted Mobility: Walking and Moving Around Goal Status [phone]): At least 1 percent but less than 20 percent impaired, limited or restricted    Peter Rose, [place], DPT, NCS 09/11/17, 4:34 PM [phone]

## 2017-09-11 NOTE — Progress Notes (Signed)
  Subjective:  POD #1 s/p right knee the anterior cruciate ligament reconstruction.  Patient reports pain as marked.  Patient requiring morphine IV for pain control.  He is also complaining of abdominal distention. He states he is not passing gas.  Patient has hiccups and burps.  He had urinary retention postoperatively was able to finally urinate around 8 PM last night.  Objective:   VITALS:   Vitals:   09/10/17 1953 09/10/17 2358 09/11/17 0444 09/11/17 0801  BP: 137/76 129/72 124/72 138/84  Pulse: 100 78 72 71  Resp: 18 13 18 18   Temp: 98.2 F (36.8 C) 98.7 F (37.1 C) 97.9 F (36.6 C) 97.9 F (36.6 C)  TempSrc: Oral Oral Oral Oral  SpO2: 92% 96% 96% 97%  Weight:        PHYSICAL EXAM: Right lower extremity: Neurovascular intact Sensation intact distally Intact pulses distally Dorsiflexion/Plantar flexion intact Incision: dressing C/D/I Compartment soft  Patient has abdominal distention but does not have a rigid abdomen.  LABS  Results for orders placed or performed during the hospital encounter of 09/10/17 (from the past 24 hour(s))  CBC     Status: Abnormal   Collection Time: 09/11/17  4:01 AM  Result Value Ref Range   WBC 9.7 3.8 - 10.6 K/uL   RBC 4.01 (L) 4.40 - 5.90 MIL/uL   Hemoglobin 13.0 13.0 - 18.0 g/dL   HCT 37.9 (L) 40.0 - 52.0 %   MCV 94.7 80.0 - 100.0 fL   MCH 32.5 26.0 - 34.0 pg   MCHC 34.3 32.0 - 36.0 g/dL   RDW 14.7 (H) 11.5 - 14.5 %   Platelets 196 150 - 440 K/uL  Basic metabolic panel     Status: Abnormal   Collection Time: 09/11/17  4:01 AM  Result Value Ref Range   Sodium 135 135 - 145 mmol/L   Potassium 4.4 3.5 - 5.1 mmol/L   Chloride 101 101 - 111 mmol/L   CO2 26 22 - 32 mmol/L   Glucose, Bld 122 (H) 65 - 99 mg/dL   BUN 19 6 - 20 mg/dL   Creatinine, Ser 1.14 0.61 - 1.24 mg/dL   Calcium 9.0 8.9 - 10.3 mg/dL   GFR calc non Af Amer >60 >60 mL/min   GFR calc Af Amer >60 >60 mL/min   Anion gap 8 5 - 15    No results  found.  Assessment/Plan: 1 Day Post-Op   Active Problems:   S/P ACL reconstruction  Patient is being ordered for a KUB and a G I consult to evaluate for possible postoperative ileus. Patient is having significant right knee pain and is requiring IV narcotics. He will need to stay overnight for additional pain management and workup of his abdominal distention. Continue physical therapy.  Patient will take Lovenox for DVT prophylaxis while an inpatient.    Thornton Park , MD 09/11/2017, 2:09 PM

## 2017-09-11 NOTE — Progress Notes (Signed)
Notified Dr. Mack Guise about patient's complaint of hiccups. Thorazine 25 mg orally 4 times a day prn. Writer called pharmacy to verify dosage.

## 2017-09-11 NOTE — Consult Note (Addendum)
[name] , MD [address] [place], [place], [place], [address] [address], [place], [place], [place], [address] Phone: [phone]  Fax: [phone]  Consultation  Referring Provider:  Dr Mack Guise Primary Care Physician:  Lucille Passy, MD Primary Gastroenterologist:  None         Reason for Consultation:     Abdominal distension   Date of Admission:  09/10/2017 Date of Consultation:  09/11/2017         HPI:   FORBES LOLL is a 61 y.o. male  Underwent right knee the anterior cruciate ligament reconstruction yesterday, complaining of abdominal distension . Not passing gas.   BMP this morning was normal , On opiods for pain management.   He says since the surgery has noted gradual distension of his abdomen , not passing gas, burping which has been helping , not had a bowel movement , last bowel movement was 2 days back which was large. Denies any abdominal pain. He has been having a lot of hiccups since this morning .     Past Medical History:  Diagnosis Date  . Medical history non-contributory   . Psoriasis     Past Surgical History:  Procedure Laterality Date  . KNEE ARTHROSCOPY WITH ANTERIOR CRUCIATE LIGAMENT (ACL) REPAIR WITH HAMSTRING GRAFT Right 09/10/2017   Procedure: KNEE ARTHROSCOPY WITH ANTERIOR CRUCIATE LIGAMENT (ACL) REPAIR WITH HAMSTRING GRAFT AND  ALLOGRAFT ,& MEDIAL MENISCECTOMY;  Surgeon: Thornton Park, MD;  Location: [facility] ORS;  Service: Orthopedics;  Laterality: Right;  . NO PAST SURGERIES      Prior to Admission medications   Medication Sig Start Date End Date Taking? Authorizing Provider  methotrexate (RHEUMATREX) 2.5 MG tablet TAKE 10 TABLETS BY MOUTH EVERY WEEK 09/04/17  Yes Lucille Passy, MD  ALPRAZolam Duanne Moron) 0.25 MG tablet Take 1 tablet (0.25 mg total) by mouth 2 (two) times daily as needed for anxiety. Patient not taking: Reported on 09/02/2017 12/26/16   Lucille Passy, MD  ibuprofen (ADVIL,MOTRIN) 200 MG tablet Take 400-600 mg  every 8 (eight) hours as needed by mouth for headache or moderate pain.  10/13/16   [provider]  levocetirizine (XYZAL) 5 MG tablet take 1 tablet by mouth once daily every evening 03/19/17   Lucille Passy, MD  methotrexate [employer]) 2.5 MG tablet take 10 tablets by mouth every week 09/03/17   Libby Maw, MD  sildenafil (VIAGRA) 100 MG tablet take 1 tablet by mouth EVERY 36 HOURS IF NEEDED 03/19/17   Lucille Passy, MD  traZODone (DESYREL) 50 MG tablet Take 0.5-1 tablets (25-50 mg total) by mouth at bedtime as needed for sleep. 12/26/16   Lucille Passy, MD    Family History  Problem Relation Age of Onset  . Headache Neg Hx      Social History   Tobacco Use  . Smoking status: Former Smoker    Packs/day: 1.00    Last attempt to quit: 01/06/2017    Years since quitting: 0.6  . Smokeless tobacco: Never Used  Substance Use Topics  . Alcohol use: Yes    Comment: occasional  . Drug use: No    Allergies as of 08/28/2017 - Review Complete 07/09/2017  Allergen Reaction Noted  . Codeine Other (See Comments) 09/29/2013  . Other  09/29/2013  . Vicodin [hydrocodone-acetaminophen]  05/28/2012    Review of Systems:    All systems reviewed and negative except where noted in HPI.   Physical Exam:  Vital  signs in last 24 hours: Temp:  [97.7 F (36.5 C)-98.7 F (37.1 C)] 97.9 F (36.6 C) (11/28 0801) Pulse Rate:  [71-100] 71 (11/28 0801) Resp:  [13-18] 18 (11/28 0801) BP: (124-138)/(72-84) 138/84 (11/28 0801) SpO2:  [92 %-97 %] 97 % (11/28 0801) Last BM Date: 09/10/17 General:   Pleasant, cooperative in NAD Head:  Normocephalic and atraumatic. Eyes:   No icterus.   Conjunctiva pink. PERRLA. Ears:  Normal auditory acuity. Neck:  Supple; no masses or thyroidomegaly Lungs: Respirations even and unlabored. Lungs clear to auscultation bilaterally.   No wheezes, crackles, or rhonchi.  Heart:  Regular rate and rhythm;  Without murmur, clicks, rubs or gallops Abdomen:   Distended , non tender . Normal bowel sounds. No appreciable masses or hepatomegaly.  No rebound or guarding.  Neurologic:  Alert and oriented x3;  grossly normal neurologically. Skin:  Intact without significant lesions or rashes. Cervical Nodes:  No significant cervical adenopathy. Psych:  Alert and cooperative. Normal affect.  LAB RESULTS: Recent Labs    09/10/17 0612 09/11/17 0401  WBC 6.3 9.7  HGB 15.9 13.0  HCT 45.0 37.9*  PLT 241 196   BMET Recent Labs    09/10/17 0612 09/11/17 0401  NA 135 135  K 3.9 4.4  CL 104 101  CO2 22 26  GLUCOSE 159* 122*  BUN 17 19  CREATININE 1.44* 1.14  CALCIUM 9.3 9.0   LFT No results for input(s): PROT, ALBUMIN, AST, ALT, ALKPHOS, BILITOT, BILIDIR, IBILI in the last 72 hours. PT/INR Recent Labs    09/10/17 0612  LABPROT 12.2  INR 0.91    STUDIES: No results found.    Impression / Plan:   CIRILO CANNER is a 61 y.o. y/o male with abdominal distension 1 day after knee surgery.  Likely post operative ileus   Plan  1. Limit opioids 2. Check magnesium and if low supplement  3. IF abdominal x ray is non diagnostic then will need CT scan of the abdomen . If stool seen in the colon then would suggest enemas and oral golytely to clean the colon to help the gas to pass.  4. Serial abdominal exams 5. Suggest a fleets enema now   Thank you for involving me in the care of this patient.      LOS: 0 days   [name], MD  09/11/2017, 2:27 PM

## 2017-09-11 NOTE — Anesthesia Postprocedure Evaluation (Signed)
Anesthesia Post Note  Patient: Peter Rose  Procedure(s) Performed: KNEE ARTHROSCOPY WITH ANTERIOR CRUCIATE LIGAMENT (ACL) REPAIR WITH HAMSTRING GRAFT AND  ALLOGRAFT ,& MEDIAL MENISCECTOMY (Right Knee)  Patient location during evaluation: PACU Anesthesia Type: General Level of consciousness: awake and alert Pain management: pain level controlled Vital Signs Assessment: post-procedure vital signs reviewed and stable Respiratory status: spontaneous breathing, nonlabored ventilation, respiratory function stable and patient connected to nasal cannula oxygen Cardiovascular status: blood pressure returned to baseline and stable Postop Assessment: no apparent nausea or vomiting Anesthetic complications: no     Last Vitals:  Vitals:   09/11/17 0444 09/11/17 0801  BP: 124/72 138/84  Pulse: 72 71  Resp: 18 18  Temp: 36.6 C 36.6 C  SpO2: 96% 97%    Last Pain:  Vitals:   09/11/17 0813  TempSrc:   PainSc: [place] Dinisha Cai

## 2017-09-11 NOTE — Evaluation (Signed)
Occupational Therapy Evaluation Patient Details Name: Peter Rose MRN: [id number] DOB: [birth year] Today's Date: 09/11/2017    History of Present Illness Pt. is a 61 y.o. male who was admitted to [facility] for  ACL reconstruction of the right knee. Pt. sustained the knee injury after falling out of a crane. Pt. also sustained a right elbow injury, and anticipates having to have it surgically repaired when he has recovered from the knee surgery. Pt. PMHx includes: Psoriasis.   Clinical Impression   Pt. presents with 9/10 pain in the right knee, weakness, right elbow limitations, and limited functional mobility which hinder his ability to complete ADL, and IADL tasks. Pt. Was assessed at bed level secondary to reports of persistent 9/10 pain since last night. Pt. 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 Pt. Is planning to have elbow surgery when he heals from the knee surgery.Pt. Education was provided about A/E use for LE ADLs. Pt. could benefit from skilled OT services for ADL training, A/E training, and pt. education about home modification, DME, and work simplification techniques. Pt. Plans to return home upon discharge. Pt. reports having meals set up in the refrigerator/freezer for simple prep during his recovery. Pt. Could benefit from a home aide to assist with ADLs.    Follow Up Recommendations  Home health OT    Equipment Recommendations  3 in 1 bedside commode;Tub/shower bench    Recommendations for Other Services       Precautions / Restrictions Precautions Required Braces or Orthoses: Knee Immobilizer - Right Knee Immobilizer - Right: On at all times Restrictions Weight Bearing Restrictions: Yes RLE Weight Bearing: Non weight bearing                                                    ADL  either performed or assessed with clinical judgement   ADL Overall ADL's : Needs assistance/impaired Eating/Feeding: Set up   Grooming: Set up   Upper Body Bathing: Set up;Minimal assistance   Lower Body Bathing: Maximal assistance   Upper Body Dressing : Minimal assistance   Lower Body Dressing: Maximal assistance               Functional mobility during ADLs: (Pt. seen at bedlevel secondary to 9/10 pain in right knee since last night.) General ADL Comments: Pt. education was provided about A/E use for LE ADLs.     Vision         Perception     Praxis      Pertinent Vitals/Pain Pain Assessment: 0-10 Pain Score: 9  Pain Descriptors / Indicators: Aching;Constant Pain Intervention(s): Premedicated before session;Limited activity within patient's tolerance;Monitored during session     Hand Dominance Right   Extremity/Trunk Assessment         Communication Communication Communication: No difficulties   Cognition Arousal/Alertness: Awake/alert Behavior During Therapy: WFL for tasks assessed/performed Overall Cognitive Status: Within Functional Limits for tasks assessed                                     General Comments       Exercises  Shoulder Instructions      Home Living Family/patient expects to be discharged to:: Private residence Living Arrangements: Alone Available Help at Discharge: Neighbor Type of Home: Apartment Home Access: Level entry     [place]: One level     Bathroom Shower/Tub: International aid/development worker Accessibility: Yes   Home Equipment: [place] - single point;Crutches          Prior Functioning/Environment Level of Independence: Needs assistance    ADL's / Homemaking Assistance Needed: Pt. 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            OT Problem List:  Decreased strength;Decreased activity tolerance;Pain;Impaired UE functional use;Decreased range of motion;Decreased knowledge of use of DME or AE      OT Treatment/Interventions: Self-care/ADL training;Therapeutic activities;Patient/family education    OT Goals(Current goals can be found in the care plan section) Acute Rehab OT Goals Patient Stated Goal: To be able to return to work without pain OT Goal Formulation: With patient Potential to Achieve Goals: Good  OT Frequency: Min 2X/week   Barriers to D/C:            Co-evaluation              AM-PAC PT "6 Clicks" Daily Activity     Outcome Measure Help from another person eating meals?: None Help from another person taking care of personal grooming?: None Help from another person toileting, which includes using toliet, bedpan, or urinal?: A Little Help from another person bathing (including washing, rinsing, drying)?: A Lot Help from another person to put on and taking off regular upper body clothing?: A Little Help from another person to put on and taking off regular lower body clothing?: A Lot 6 Click Score: 18   End of Session    Activity Tolerance: Patient tolerated treatment well Patient left: in bed;with call bell/phone within reach;with bed alarm set  OT Visit Diagnosis: Muscle weakness (generalized) (M62.81)                Time: 6010-9323 OT Time Calculation (min): 28 min Charges:  OT General Charges $OT Visit: 1 Visit OT Evaluation $OT Eval Moderate Complexity: 1 Mod G-Codes: OT G-codes **NOT FOR INPATIENT CLASS** Functional Assessment Tool Used: AM-PAC 6 Clicks Daily Activity Functional Limitation: Self care Self Care Current Status (F5732): At least 40 percent but less than 60 percent impaired, limited or restricted Self Care Goal Status (K0254): At least 1 percent but less than 20 percent impaired, limited or restricted Self Care Discharge Status [phone]): 0 percent impaired, limited or restricted    Harrel Carina, MS, OTR/L   Harrel Carina, MS, OTR/L 09/11/2017, 1:23 PM

## 2017-09-11 NOTE — Progress Notes (Signed)
PT Cancellation Note  Patient Details Name: Peter Rose MRN: [id number] DOB: 14-Mar-1956   Cancelled Treatment:    Reason Eval/Treat Not Completed: Patient at procedure or test/unavailable(Consult received and chart reviewed.  Patient currently with OT and unavailable for evaluation; will re-attempt at later time.)   [name], PT, DPT, NCS 09/11/17, 9:49 AM [phone]

## 2017-09-11 NOTE — Evaluation (Signed)
Physical Therapy Evaluation Patient Details Name: Peter Rose MRN: [id number] DOB: 02-18-1956 Today's Date: 09/11/2017   History of Present Illness  Pt. is a 61 y.o. male who was admitted to [facility] for  ACL reconstruction of the right knee. Pt. sustained the knee injury after falling out of a crane. Pt. also sustained a right elbow injury, and anticipates having to have it surgically repaired when he has recovered from the knee surgery. Pt. PMHx includes: Psoriasis.  Clinical Impression  Upon evaluation, patient alert and oriented; follows all commands and demonstrates good insight/safety awareness.  R knee immobilized in bledsoe brace; strength and ROM otherwise grossly WFL.  Demonstrates fair/good R quad activation with functional activities; able to negotiate LE on/off bed without difficulty.  Completes bed mobility with mod indep; sit/stand, basic transfers and gait (30') with RW, cga/close sup.  Slow and guarded, but with good control and effort.  Did trial variety of other assistive devices (loftstrands, axillary crutches, R PFRW), but optimal safety/stability noted with use of regular RW.  Do recommend continued use of RW with all mobility at this time. Would benefit from skilled PT to address above deficits and promote optimal return to PLOF; recommend transition to home with HHPT/OT services as appropriate.    Follow Up Recommendations DC plan and follow up therapy as arranged by surgeon;Home health PT    Equipment Recommendations  Rolling walker with 5" wheels;Other (comment)(tub transfer bench)    Recommendations for Other Services       Precautions / Restrictions Precautions Precautions: Fall Required Braces or Orthoses: Knee Immobilizer - Right Knee Immobilizer - Right: On at all times Restrictions Weight Bearing Restrictions: Yes RLE Weight Bearing: Non weight bearing Other Position/Activity Restrictions: per telephone clarification with Dr. Mack Guise, patient okay for toe  touch WBing for balance, but does not wish for patient to put weight of body/load leg      Mobility  Bed Mobility Overal bed mobility: Modified Independent                Transfers Overall transfer level: Needs assistance Equipment used: Rolling walker (2 wheeled) Transfers: Sit to/from Stand Sit to Stand: Supervision         General transfer comment: good hand placement, good strength/control of L LE with movement transition  Ambulation/Gait Ambulation/Gait assistance: Supervision Ambulation Distance (Feet): 30 Feet Assistive device: Rolling walker (2 wheeled)       General Gait Details: hop-to gait pattern with good ability to maintain appropriate R LE WBing (tends to prefer NWB vs TTWB); good UB Strenght.  Slow and cautious, but no instability or LOB noted.  Stairs Stairs: (none required for discharge)          Wheelchair Mobility    Modified Rankin (Stroke Patients Only)       Balance Overall balance assessment: Needs assistance Sitting-balance support: No upper extremity supported;Feet supported Sitting balance-Leahy Scale: Good     Standing balance support: Bilateral upper extremity supported Standing balance-Leahy Scale: Fair                               Pertinent Vitals/Pain Pain Assessment: 0-10 Pain Score: 5  Pain Location: R k nee Pain Descriptors / Indicators: Aching;Grimacing;Guarding Pain Intervention(s): Limited activity within patient's tolerance;Monitored during session;Repositioned;Premedicated before session    Home Living Family/patient expects to be discharged to:: Private residence Living Arrangements: Alone Available Help at Discharge: Neighbor Type of Home: Apartment Home Access:  Level entry     Home Layout: One level        Prior Function Level of Independence: Independent      ADL's / Homemaking Assistance Needed: Pt. 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        Hand Dominance        Extremity/Trunk Assessment   Upper Extremity Assessment Upper Extremity Assessment: Overall WFL for tasks assessed(reports mild pain/instability to R elbow (history of injury during fall in Dec 2017; pending surgical repair))    Lower Extremity Assessment Lower Extremity Assessment: Generalized weakness(R knee immobilized with bledsoe brace; hip and ankle strength/ROM [facility].  Able to complete SLR, negotiating extremity over edge of bed without difficultly.  L LE Grossly WFL)       Communication   Communication: No difficulties  Cognition Arousal/Alertness: Awake/alert Behavior During Therapy: WFL for tasks assessed/performed Overall Cognitive Status: Within Functional Limits for tasks assessed                                        General Comments      Exercises Other Exercises Other Exercises: Gait trials with variety of assistive devices (bilat loftstrands, bilat axillary crutches, R PFRW and RW), 30' x1 each device, cga/min assist-optimal safety/stability with use of RW.  Recommend continued use of RW upon discharge; patient in agreement. Other Exercises: Reviewed use of knee immobilizer (on at all times) and WBing restrictions; patient voiced agreement.  To discuss progression of both with MD during rounds.   Assessment/Plan    PT Assessment Patient needs continued PT services  PT Problem List Decreased strength;Decreased range of motion;Decreased activity tolerance;Decreased balance;Decreased mobility;Decreased coordination;Decreased cognition;Decreased knowledge of use of DME;Decreased safety awareness;Decreased knowledge of precautions;Pain;Decreased skin integrity       PT Treatment Interventions Gait training;DME instruction;Functional mobility training;Therapeutic activities;Therapeutic exercise;Balance training;Patient/family  education    PT Goals (Current goals can be found in the Care Plan section)  Acute Rehab PT Goals Patient Stated Goal: To be able to return to work without pain PT Goal Formulation: With patient Time For Goal Achievement: 09/25/17 Potential to Achieve Goals: Good    Frequency BID   Barriers to discharge        Co-evaluation               AM-PAC PT "6 Clicks" Daily Activity  Outcome Measure Difficulty turning over in bed (including adjusting bedclothes, sheets and blankets)?: A Little Difficulty moving from lying on back to sitting on the side of the bed? : A Little Difficulty sitting down on and standing up from a chair with arms (e.g., wheelchair, bedside commode, etc,.)?: Unable Help needed moving to and from a bed to chair (including a wheelchair)?: A Little Help needed walking in hospital room?: A Little Help needed climbing 3-5 steps with a railing? : A Little 6 Click Score: 16    End of Session Equipment Utilized During Treatment: Gait belt Activity Tolerance: Patient tolerated treatment well Patient left: in bed;with call bell/phone within reach Nurse Communication: Mobility status PT Visit Diagnosis: Difficulty in walking, not elsewhere classified (R26.2);Muscle weakness (generalized) (M62.81);Pain Pain - Right/Left: Right Pain - part of body: Knee    Time: 3614-4315 PT Time Calculation (min) (ACUTE ONLY): 38 min   Charges:   PT Evaluation $PT  Eval Low Complexity: 1 Low PT Treatments $Gait Training: 8-22 mins $Therapeutic Activity: 8-22 mins   PT G Codes:   PT G-Codes **NOT FOR INPATIENT CLASS** Functional Assessment Tool Used: AM-PAC 6 Clicks Basic Mobility Functional Limitation: Mobility: Walking and moving around Mobility: Walking and Moving Around Current Status (T1438): At least 20 percent but less than 40 percent impaired, limited or restricted Mobility: Walking and Moving Around Goal Status [phone]): At least 1 percent but less than 20 percent  impaired, limited or restricted    Alena Blankenbeckler H. Owens Shark, PT, DPT, NCS 09/11/17, 2:13 PM [phone]

## 2017-09-12 DIAGNOSIS — R109 Unspecified abdominal pain: Secondary | ICD-10-CM | POA: Diagnosis not present

## 2017-09-12 DIAGNOSIS — S83511A Sprain of anterior cruciate ligament of right knee, initial encounter: Secondary | ICD-10-CM | POA: Diagnosis not present

## 2017-09-12 LAB — BASIC METABOLIC PANEL
Anion gap: 8 (ref 5–15)
BUN: 31 mg/dL — AB (ref 6–20)
CALCIUM: 9.1 mg/dL (ref 8.9–10.3)
CHLORIDE: 100 mmol/L — AB (ref 101–111)
CO2: 26 mmol/L (ref 22–32)
CREATININE: 1.28 mg/dL — AB (ref 0.61–1.24)
GFR calc non Af Amer: 59 mL/min — ABNORMAL LOW (ref >60)
Glucose, Bld: 129 mg/dL — ABNORMAL HIGH (ref 65–99)
Potassium: 3.7 mmol/L (ref 3.5–5.1)
SODIUM: 134 mmol/L — AB (ref 135–145)

## 2017-09-12 LAB — CBC
HCT: 35.3 % — ABNORMAL LOW (ref 40.0–52.0)
HEMOGLOBIN: 12.3 g/dL — AB (ref 13.0–18.0)
MCH: 32.7 pg (ref 26.0–34.0)
MCHC: 34.7 g/dL (ref 32.0–36.0)
MCV: 94 fL (ref 80.0–100.0)
Platelets: 179 10*3/uL (ref 150–440)
RBC: 3.75 MIL/uL — ABNORMAL LOW (ref 4.40–5.90)
RDW: 14.8 % — AB (ref 11.5–14.5)
WBC: 9.1 10*3/uL (ref 3.8–10.6)

## 2017-09-12 MED ORDER — ASPIRIN EC 325 MG PO TBEC: 325.0000 mg | DELAYED_RELEASE_TABLET | Freq: Two times a day (BID) | ORAL | 0 refills | Status: DC

## 2017-09-12 MED ORDER — DOCUSATE SODIUM 100 MG PO CAPS: 100.0000 mg | ORAL_CAPSULE | Freq: Two times a day (BID) | ORAL | 0 refills | Status: DC

## 2017-09-12 MED ORDER — CHLORPROMAZINE HCL 25 MG PO TABS: 25.0000 mg | ORAL_TABLET | Freq: Four times a day (QID) | ORAL | 0 refills | Status: DC | PRN

## 2017-09-12 MED ORDER — OXYCODONE HCL 5 MG PO TABS: 5.0000 mg | ORAL_TABLET | ORAL | 0 refills | Status: DC | PRN

## 2017-09-12 NOTE — Progress Notes (Signed)
PT Cancellation Note  Patient Details Name: Peter Rose MRN: [id number] DOB: 05/14/1956   Cancelled Treatment:    Reason Eval/Treat Not Completed: Pain limiting ability to participate(Treatment session attempted. Patient declining at this time due to pain (abdomen, R LE).  Will re-attempt in PM as appropriate.)   Iosefa Weintraub H. Owens Shark, PT, DPT, NCS 09/12/17, 10:49 AM [phone]

## 2017-09-12 NOTE — Progress Notes (Signed)
Patient has some belching which temporarily relieves symptoms.  IV fluids restarted

## 2017-09-12 NOTE — Discharge Summary (Signed)
Physician Discharge Summary  Patient ID: Peter Rose MRN: [id number] DOB/AGE: [birth year] 61 y.o.  Admit date: 09/10/2017 Discharge date: 09/12/2017  Admission Diagnoses:  S83.511A Sprain of anterior cruciate ligament of right knee, init <principal problem not specified>  Discharge Diagnoses:  S83.511A Sprain of anterior cruciate ligament of right knee, init Active Problems:   S/P ACL reconstruction   Past Medical History:  Diagnosis Date  . Medical history non-contributory   . Psoriasis     Surgeries: Procedure(s): RIGHT KNEE ARTHROSCOPY WITH ANTERIOR CRUCIATE LIGAMENT (ACL) REPAIR WITH HAMSTRING AUTOGRAFT AND  ALLOGRAFT & MEDIAL MENISCECTOMY on 09/10/2017   Consultants (if any): Treatment Team:  [name], MD  Discharged Condition: Improved  Hospital Course: Peter Rose is an 61 y.o. male who was admitted 09/10/2017 with a diagnosis of  S83.511A Sprain of anterior cruciate ligament of right knee, initand went to the operating room on 09/10/2017 and underwent an uncomplicated right knee anterior cruciate ligament reconstruction with partial medial meniscectomy.    He was given perioperative antibiotics:  Anti-infectives (From admission, onward)   Start     Dose/Rate Route Frequency Ordered Stop   09/10/17 1500  ceFAZolin (ANCEF) 1 g in dextrose 5 % 50 mL IVPB     1 g 100 mL/hr over 30 Minutes Intravenous Every 6 hours 09/10/17 1345 09/11/17 0206   09/10/17 0608  ceFAZolin (ANCEF) 2-4 GM/100ML-% IVPB    Comments:  Dewayne Hatch   : cabinet override      09/10/17 0608 09/10/17 1814   09/10/17 0000  ceFAZolin (ANCEF) IVPB 2g/100 mL premix  Status:  Discontinued     2 g 200 mL/hr over 30 Minutes Intravenous On call to O.R. 09/09/17 2359 09/10/17 0559    .  He was given sequential compression devices, early ambulation, and lovenox for DVT prophylaxis.   Patient had significant pain postoperatively which required IV pain medication. He also experienced  abdominal distention. He had a gastrointestinal consult. Plain films and CT scan of his abdomen were performed and were negative for dilated loops of bowel. He was no evidence of obstruction. By postoperative day #2 the patient had a bowel movement. His pain was slowly improving. He is making progress with physical therapy. Given his clinical improvement, he is prepared for discharge home.   He benefited maximally from the hospital stay and there were no complications.    Recent vital signs:  Vitals:   09/12/17 0427 09/12/17 0815  BP: 140/77 (!) 138/94  Pulse: 100 93  Resp: 14 20  Temp: 98.9 F (37.2 C) 98.3 F (36.8 C)  SpO2: 94% 97%    Recent laboratory studies:  Lab Results  Component Value Date   HGB 12.3 (L) 09/12/2017   HGB 13.0 09/11/2017   HGB 15.9 09/10/2017   Lab Results  Component Value Date   WBC 9.1 09/12/2017   PLT 179 09/12/2017   Lab Results  Component Value Date   INR 0.91 09/10/2017   Lab Results  Component Value Date   NA 134 (L) 09/12/2017   K 3.7 09/12/2017   CL 100 (L) 09/12/2017   CO2 26 09/12/2017   BUN 31 (H) 09/12/2017   CREATININE 1.28 (H) 09/12/2017   GLUCOSE 129 (H) 09/12/2017    Discharge Medications:   Allergies as of 09/12/2017      Reactions   Codeine Other (See Comments)   feels wired and jittery   Other Other (See Comments)   HYCODAN: Makes pt feel weird/jittery  Vicodin [hydrocodone-acetaminophen] Other (See Comments)   Pt feels "wired" with pain meds      Medication List    STOP taking these medications   ibuprofen 200 MG tablet Commonly known as:  ADVIL,MOTRIN     TAKE these medications   ALPRAZolam 0.25 MG tablet Commonly known as:  XANAX Take 1 tablet (0.25 mg total) by mouth 2 (two) times daily as needed for anxiety.   aspirin EC 325 MG tablet Take 1 tablet (325 mg total) by mouth 2 (two) times daily.   chlorproMAZINE 25 MG tablet Commonly known as:  THORAZINE Take 1 tablet (25 mg total) by mouth 4  (four) times daily as needed for hiccoughs (hiccups).   docusate sodium 100 MG capsule Commonly known as:  COLACE Take 1 capsule (100 mg total) by mouth 2 (two) times daily.   levocetirizine 5 MG tablet Commonly known as:  XYZAL take 1 tablet by mouth once daily every evening   methotrexate 2.5 MG tablet Commonly known as:  RHEUMATREX take 10 tablets by mouth every week   methotrexate 2.5 MG tablet Commonly known as:  RHEUMATREX TAKE 10 TABLETS BY MOUTH EVERY WEEK   oxyCODONE 5 MG immediate release tablet Commonly known as:  Oxy IR/ROXICODONE Take 1-2 tablets (5-10 mg total) by mouth every 4 (four) hours as needed for breakthrough pain ((for MODERATE breakthrough pain)).   sildenafil 100 MG tablet Commonly known as:  VIAGRA take 1 tablet by mouth EVERY 36 HOURS IF NEEDED   traZODone 50 MG tablet Commonly known as:  DESYREL Take 0.5-1 tablets (25-50 mg total) by mouth at bedtime as needed for sleep.       Diagnostic Studies: Ct Abdomen Pelvis Wo Contrast  Result Date: 09/11/2017 CLINICAL DATA:  Abdominal distention. PICC absent bloating. Patient had knee surgery yesterday. EXAM: CT ABDOMEN AND PELVIS WITHOUT CONTRAST TECHNIQUE: Multidetector CT imaging of the abdomen and pelvis was performed following the standard protocol without IV contrast. COMPARISON:  None. FINDINGS: Lower chest:  No acute finding Hepatobiliary: Hepatic steatosis.Cholelithiasis. No evidence of cholecystitis. Pancreas: Unremarkable. Spleen: Unremarkable. Adrenals/Urinary Tract: Negative adrenals. No hydronephrosis or stone. Unremarkable bladder. Stomach/Bowel: No obstruction or inflammatory changes. No appendicitis. Vascular/Lymphatic: No acute vascular abnormality. Scattered atherosclerotic calcification of the aorta. No mass or adenopathy. Reproductive:Negative Other: No ascites or pneumoperitoneum.  Tiny umbilical hernia. Musculoskeletal: No acute finding. IMPRESSION: 1. No acute finding. 2. Hepatic  steatosis. 3. Cholelithiasis. Electronically Signed   By: Monte Fantasia M.D.   On: 09/11/2017 21:30   Dg Abd 1 View  Result Date: 09/11/2017 CLINICAL DATA:  Abdominal distension. EXAM: ABDOMEN - 1 VIEW COMPARISON:  None. FINDINGS: The bowel gas pattern is normal. No radio-opaque calculi or other significant radiographic abnormality are seen. Probable phleboliths in the left pelvis. IMPRESSION: Negative. Electronically Signed   By: Fidela Salisbury M.D.   On: 09/11/2017 15:14    Disposition: 01-Home or Self Care  Discharge Instructions    Call MD / Call 911   Complete by:  As directed    If you experience chest pain or shortness of breath, CALL 911 and be transported to the hospital emergency room.  If you develope a fever above 101 F, pus (white drainage) or increased drainage or redness at the wound, or calf pain, call your surgeon's office.   Constipation Prevention   Complete by:  As directed    Drink plenty of fluids.  Prune juice may be helpful.  You may use a stool softener, such as Colace (  over the counter) 100 mg twice a day.  Use MiraLax (over the counter) for constipation as needed.   Diet - low sodium heart healthy   Complete by:  As directed    Discharge instructions   Complete by:  As directed    Patient will use crutches for the next 4-6 weeks. He needs to wear the hinged brace at all times including sleep.   Patiient should elevate and ice the operative leg whenever possible over the next week. Patient should keep the surgical dressing on until their follow up in the office in one week.  The brace and bandage need to be covered during showers with a plastic trash bag and elastic at the top. Patient is to take enteric-coated aspirin 325 mg by mouth twice a day 6 weeks postop for DVT prophylaxis.   Driving restrictions   Complete by:  As directed    No driving for 4-8 weeks   Increase activity slowly as tolerated   Complete by:  As directed    Lifting restrictions    Complete by:  As directed    No lifting for 12-16 weeks         Signed: Thornton Park ,MD 09/12/2017, 1:28 PM

## 2017-09-12 NOTE — Progress Notes (Signed)
PT Cancellation Note  Patient Details Name: Peter Rose MRN: [id number] DOB: [birth year]   Cancelled Treatment:    Reason Eval/Treat Not Completed: Patient declined, no reason specified. Treatment attempted this afternoon. Pt refuses noting he is in too much pain in abdomen. Pt states he has been up with nursing to use the bathroom and simple activities take an extended period of time to perform. Pt does not feel able to participate in anything at this time despite gentle encouragement. Re attempt tomorrow, as pt notes he will not be discharged today.    Larae Grooms, PTA 09/12/2017, 3:25 PM

## 2017-09-12 NOTE — Progress Notes (Signed)
Subjective:  POD #2 s/p right knee ACL reconstruction.   Patient has been dealing with severe postoperative pain. He is still requiring IV morphine. Patient also had abdominal distention yesterday. X-rays and a CT scan were negative for significant bowel distention. Patient has now had a bowel movement. Appreciate gastroenterology consult. Patient reports pain as marked.    Objective:   VITALS:   Vitals:   09/11/17 0801 09/11/17 1934 09/12/17 0427 09/12/17 0815  BP: 138/84 (!) 153/94 140/77 (!) 138/94  Pulse: 71 94 100 93  Resp: 18 18 14 20   Temp: 97.9 F (36.6 C) 98.6 F (37 C) 98.9 F (37.2 C) 98.3 F (36.8 C)  TempSrc: Oral Oral Oral Oral  SpO2: 97% 96% 94% 97%  Weight:        PHYSICAL EXAM: Right lower extremity: ABD soft Neurovascular intact Sensation intact distally Intact pulses distally Dorsiflexion/Plantar flexion intact Compartment soft  LABS  Results for orders placed or performed during the hospital encounter of 09/10/17 (from the past 24 hour(s))  CBC     Status: Abnormal   Collection Time: 09/12/17  3:55 AM  Result Value Ref Range   WBC 9.1 3.8 - 10.6 K/uL   RBC 3.75 (L) 4.40 - 5.90 MIL/uL   Hemoglobin 12.3 (L) 13.0 - 18.0 g/dL   HCT 35.3 (L) 40.0 - 52.0 %   MCV 94.0 80.0 - 100.0 fL   MCH 32.7 26.0 - 34.0 pg   MCHC 34.7 32.0 - 36.0 g/dL   RDW 14.8 (H) 11.5 - 14.5 %   Platelets 179 150 - 440 K/uL  Basic metabolic panel     Status: Abnormal   Collection Time: 09/12/17  3:55 AM  Result Value Ref Range   Sodium 134 (L) 135 - 145 mmol/L   Potassium 3.7 3.5 - 5.1 mmol/L   Chloride 100 (L) 101 - 111 mmol/L   CO2 26 22 - 32 mmol/L   Glucose, Bld 129 (H) 65 - 99 mg/dL   BUN 31 (H) 6 - 20 mg/dL   Creatinine, Ser 1.28 (H) 0.61 - 1.24 mg/dL   Calcium 9.1 8.9 - 10.3 mg/dL   GFR calc non Af Amer 59 (L) >60 mL/min   GFR calc Af Amer >60 >60 mL/min   Anion gap 8 5 - 15    Ct Abdomen Pelvis Wo Contrast  Result Date: 09/11/2017 CLINICAL DATA:  Abdominal  distention. PICC absent bloating. Patient had knee surgery yesterday. EXAM: CT ABDOMEN AND PELVIS WITHOUT CONTRAST TECHNIQUE: Multidetector CT imaging of the abdomen and pelvis was performed following the standard protocol without IV contrast. COMPARISON:  None. FINDINGS: Lower chest:  No acute finding Hepatobiliary: Hepatic steatosis.Cholelithiasis. No evidence of cholecystitis. Pancreas: Unremarkable. Spleen: Unremarkable. Adrenals/Urinary Tract: Negative adrenals. No hydronephrosis or stone. Unremarkable bladder. Stomach/Bowel: No obstruction or inflammatory changes. No appendicitis. Vascular/Lymphatic: No acute vascular abnormality. Scattered atherosclerotic calcification of the aorta. No mass or adenopathy. Reproductive:Negative Other: No ascites or pneumoperitoneum.  Tiny umbilical hernia. Musculoskeletal: No acute finding. IMPRESSION: 1. No acute finding. 2. Hepatic steatosis. 3. Cholelithiasis. Electronically Signed   By: Monte Fantasia M.D.   On: 09/11/2017 21:30   Dg Abd 1 View  Result Date: 09/11/2017 CLINICAL DATA:  Abdominal distension. EXAM: ABDOMEN - 1 VIEW COMPARISON:  None. FINDINGS: The bowel gas pattern is normal. No radio-opaque calculi or other significant radiographic abnormality are seen. Probable phleboliths in the left pelvis. IMPRESSION: Negative. Electronically Signed   By: Fidela Salisbury M.D.   On: 09/11/2017 15:14  Assessment/Plan: 2 Days Post-Op   Active Problems:   S/P ACL reconstruction  Continue to control patient's pain with IV morphine and oxycodone as needed. Continue medical bowel regimen. Patient should continue with physical therapy. He would benefit from home health PT and an aide since he lives by himself. The patient's pain improves he may be discharged home this evening otherwise I anticipate discharge tomorrow. Continue Lovenox while an inpatient.    Thornton Park , MD 09/12/2017, 1:13 PM

## 2017-09-12 NOTE — Plan of Care (Signed)
  Progressing Education: Knowledge of General Education information will improve 09/12/2017 0545 - Progressing by Dereon Williamsen, Lucille Passy, RN Health Behavior/Discharge Planning: Ability to manage health-related needs will improve 09/12/2017 0545 - Progressing by Dredyn Gubbels, Lucille Passy, RN Clinical Measurements: Ability to maintain clinical measurements within normal limits will improve 09/12/2017 0545 - Progressing by Tyrik Stetzer, Lucille Passy, RN Will remain free from infection 09/12/2017 0545 - Progressing by [place], Lucille Passy, RN Diagnostic test results will improve 09/12/2017 0545 - Progressing by Neeka Urista, Lucille Passy, RN Respiratory complications will improve 09/12/2017 0545 - Progressing by Bryna Colander, RN Cardiovascular complication will be avoided 09/12/2017 0545 - Progressing by Bryna Colander, RN Activity: Risk for activity intolerance will decrease 09/12/2017 0545 - Progressing by Bryna Colander, RN Nutrition: Adequate nutrition will be maintained 09/12/2017 0545 - Progressing by Bryna Colander, RN Coping: Level of anxiety will decrease 09/12/2017 0545 - Progressing by Bryna Colander, RN Elimination: Will not experience complications related to bowel motility 09/12/2017 0545 - Progressing by Bryna Colander, RN Will not experience complications related to urinary retention 09/12/2017 0545 - Progressing by Chaitanya Amedee, Lucille Passy, RN Pain Managment: General experience of comfort will improve 09/12/2017 0545 - Progressing by Luvern Mischke, Lucille Passy, RN Safety: Ability to remain free from injury will improve 09/12/2017 0545 - Progressing by Sister Carbone, Lucille Passy, RN Skin Integrity: Risk for impaired skin integrity will decrease 09/12/2017 0545 - Progressing by Tanicia Wolaver, Lucille Passy, RN

## 2017-09-12 NOTE — Progress Notes (Signed)
OT Cancellation Note  Patient Details Name: Peter Rose MRN: [id number] DOB: May 10, 1956   Cancelled Treatment:    Reason Eval/Treat Not Completed: Pain limiting ability to participate. Will continue to monitor, and intervene when appropriate.  Harrel Carina, MS, OTR/L 09/12/2017, 9:08 AM

## 2017-09-12 NOTE — Progress Notes (Signed)
Patient refused suppository

## 2017-09-12 NOTE — Progress Notes (Signed)
Patient is complaining of severe abdominal pain, spoke with [name] from GI and recommended to have medical doctors see patient.

## 2017-09-12 NOTE — Progress Notes (Signed)
Peter Rose , MD [address], [place], [place], [place], [address] [address], [place], [place], [place], [address] Phone: [phone]  Fax: [phone]   Peter Rose is being followed for abdominal; pain  Day 1 of follow up   Subjective: Feels much better after a good bowel movement, passing gas, no pain    Objective: Vital signs in last 24 hours: Vitals:   09/11/17 0801 09/11/17 1934 09/12/17 0427 09/12/17 0815  BP: 138/84 (!) 153/94 140/77 (!) 138/94  Pulse: 71 94 100 93  Resp: 18 18 14 20   Temp: 97.9 F (36.6 C) 98.6 F (37 C) 98.9 F (37.2 C) 98.3 F (36.8 C)  TempSrc: Oral Oral Oral Oral  SpO2: 97% 96% 94% 97%  Weight:       Weight change:   Intake/Output Summary (Last 24 hours) at 09/12/2017 1158 Last data filed at 09/12/2017 0500 Gross per 24 hour  Intake 375 ml  Output -  Net 375 ml     Exam: Heart:: Regular rate and rhythm, S1S2 present or without murmur or extra heart sounds Lungs: normal, clear to auscultation and clear to auscultation and percussion Abdomen: soft, nontender, normal bowel sounds   Lab Results: @LABTEST2 @ Micro Results: No results found for this or any previous visit (from the past 240 hour(s)). Studies/Results: Ct Abdomen Pelvis Wo Contrast  Result Date: 09/11/2017 CLINICAL DATA:  Abdominal distention. PICC absent bloating. Patient had knee surgery yesterday. EXAM: CT ABDOMEN AND PELVIS WITHOUT CONTRAST TECHNIQUE: Multidetector CT imaging of the abdomen and pelvis was performed following the standard protocol without IV contrast. COMPARISON:  None. FINDINGS: Lower chest:  No acute finding Hepatobiliary: Hepatic steatosis.Cholelithiasis. No evidence of cholecystitis. Pancreas: Unremarkable. Spleen: Unremarkable. Adrenals/Urinary Tract: Negative adrenals. No hydronephrosis or stone. Unremarkable bladder. Stomach/Bowel: No obstruction or inflammatory changes. No appendicitis. Vascular/Lymphatic: No acute vascular  abnormality. Scattered atherosclerotic calcification of the aorta. No mass or adenopathy. Reproductive:Negative Other: No ascites or pneumoperitoneum.  Tiny umbilical hernia. Musculoskeletal: No acute finding. IMPRESSION: 1. No acute finding. 2. Hepatic steatosis. 3. Cholelithiasis. Electronically Signed   By: Monte Fantasia M.D.   On: 09/11/2017 21:30   Dg Abd 1 View  Result Date: 09/11/2017 CLINICAL DATA:  Abdominal distension. EXAM: ABDOMEN - 1 VIEW COMPARISON:  None. FINDINGS: The bowel gas pattern is normal. No radio-opaque calculi or other significant radiographic abnormality are seen. Probable phleboliths in the left pelvis. IMPRESSION: Negative. Electronically Signed   By: Fidela Salisbury M.D.   On: 09/11/2017 15:14   Medications: I have reviewed the patient's current medications. Scheduled Meds: . cetirizine  10 mg Oral q1800  . docusate sodium  100 mg Oral BID  . enoxaparin (LOVENOX) injection  40 mg Subcutaneous Q24H  . ferrous sulfate  325 mg Oral TID PC  . methotrexate  10 mg Oral Weekly  . senna  1 tablet Oral BID   Continuous Infusions: . sodium chloride 75 mL/hr (09/10/17 1304)  . methocarbamol (ROBAXIN)  IV     PRN Meds:.acetaminophen **OR** acetaminophen, ALPRAZolam, alum & mag hydroxide-simeth, bisacodyl, chlorproMAZINE, iopamidol, magnesium citrate, menthol-cetylpyridinium **OR** phenol, methocarbamol **OR** methocarbamol (ROBAXIN)  IV, morphine injection, ondansetron **OR** ondansetron (ZOFRAN) IV, oxyCODONE, polyethylene glycol, traZODone   Assessment: Active Problems:   S/P ACL reconstruction  Peter Rose is a 61 y.o. y/o male with abdominal pain post knee surgery, resolved over night after laxatives, enemas. Likely secondary to brief ileus vs opiod induced constipation.   Plan: 1. Limit opiods 2. Mobilize 3.  Continue laxatives- daily miralax  I will sign off.  Please call me if any further GI concerns or questions.  We would like to thank you for  the opportunity to participate in the care of Peter Rose.    LOS: 0 days   Peter Bellows, MD 09/12/2017, 11:58 AM

## 2017-09-12 NOTE — Care Management Note (Addendum)
Case Management Note  Patient Details  Name: Peter Rose MRN: [id number] Date of Birth: [birth year]  Subjective/Objective: LM with [address],  [place], [place], [place] regarding home health approval. Spoke with patient and he states he does not have a caregiver. He also reports he is in a lot of pain with his stomach, the hiccups and his knee. He states the doctor told him he did not have to go today. Dc pending.   Will await call from [facility].                   Action/Plan:   Expected Discharge Date:  09/12/17               Expected Discharge Plan:  [place]  In-House Referral:     Discharge planning Services  CM Consult  Post Acute Care Choice:  Durable Medical Equipment Choice offered to:  Patient  DME Arranged:  [facility] Agency:  [place]:  PT [facility] Agency:  [place]  Status of Service:  Completed, signed off  If discussed at [place] of Stay Meetings, dates discussed:    Additional Comments:  Jolly Mango, RN 09/12/2017, 1:40 PM

## 2017-09-13 DIAGNOSIS — S83511A Sprain of anterior cruciate ligament of right knee, initial encounter: Secondary | ICD-10-CM | POA: Diagnosis not present

## 2017-09-13 LAB — CBC
HEMATOCRIT: 34 % — AB (ref 40.0–52.0)
HEMOGLOBIN: 11.8 g/dL — AB (ref 13.0–18.0)
MCH: 33 pg (ref 26.0–34.0)
MCHC: 34.9 g/dL (ref 32.0–36.0)
MCV: 94.7 fL (ref 80.0–100.0)
Platelets: 153 10*3/uL (ref 150–440)
RBC: 3.59 MIL/uL — ABNORMAL LOW (ref 4.40–5.90)
RDW: 14.3 % (ref 11.5–14.5)
WBC: 6.5 10*3/uL (ref 3.8–10.6)

## 2017-09-13 NOTE — Care Management Note (Signed)
Case Management Note  Patient Details  Name: Peter Rose MRN: [id number] Date of Birth: 10-30-1955  Subjective/Objective: Spoke with Lauren at [facility]. She states that I can go ahead and set up home health. Gave her the name and number of Floydene Flock with [facility]. She states they bill through a vender called [employer] [phone]) so [facility] will bill them and they will pay Encarnacion Slates with [facility] updated. Faxed home health orders to [place] at [phone]                   Action/Plan:   Expected Discharge Date:  09/12/17               Expected Discharge Plan:  [place]  In-House Referral:     Discharge planning Services  CM Consult  Post Acute Care Choice:  Durable Medical Equipment Choice offered to:  Patient  DME Arranged:  [facility] DME Agency:  [place] Arranged:  PT [facility] Agency:  [place]  Status of Service:  Completed, signed off  If discussed at [place] of Stay Meetings, dates discussed:    Additional Comments:  Jolly Mango, RN 09/13/2017, 8:51 AM

## 2017-09-13 NOTE — Discharge Planning (Signed)
Patient IV removed.  RN assessment and VS revealed stability for DC to home. Discharge papers printed, explained and educated.  Informed of suggested FU appts and appts made.  Pain script signed, printed, given to patient.  All other scripts e-scribed to patient pharmacy.  Wheeled to front and family transporting home via car.

## 2017-09-13 NOTE — Progress Notes (Signed)
  Subjective:  POD #3 s/p right ACL reconstruction.   Patient reports right knee pain as mild to moderate.  Abdominal distension has resolved but he has residual hiccups.  He denies chest pain or SOB.  He was able to get up and participate with PT today.  Objective:   VITALS:   Vitals:   09/12/17 0427 09/12/17 0815 09/12/17 1559 09/12/17 1930  BP: 140/77 (!) 138/94 (!) 167/85 (!) 170/97  Pulse: 100 93 89 82  Resp: 14 20 20 17   Temp: 98.9 F (37.2 C) 98.3 F (36.8 C) 98.8 F (37.1 C) 98.9 F (37.2 C)  TempSrc: Oral Oral Oral Oral  SpO2: 94% 97% 95% 95%  Weight:        PHYSICAL EXAM: Right knee: ABD soft Neurovascular intact Sensation intact distally Intact pulses distally Dorsiflexion/Plantar flexion intact Compartment soft  LABS  Results for orders placed or performed during the hospital encounter of 09/10/17 (from the past 24 hour(s))  CBC     Status: Abnormal   Collection Time: 09/13/17  3:08 AM  Result Value Ref Range   WBC 6.5 3.8 - 10.6 K/uL   RBC 3.59 (L) 4.40 - 5.90 MIL/uL   Hemoglobin 11.8 (L) 13.0 - 18.0 g/dL   HCT 34.0 (L) 40.0 - 52.0 %   MCV 94.7 80.0 - 100.0 fL   MCH 33.0 26.0 - 34.0 pg   MCHC 34.9 32.0 - 36.0 g/dL   RDW 14.3 11.5 - 14.5 %   Platelets 153 150 - 440 K/uL    Ct Abdomen Pelvis Wo Contrast  Result Date: 09/11/2017 CLINICAL DATA:  Abdominal distention. PICC absent bloating. Patient had knee surgery yesterday. EXAM: CT ABDOMEN AND PELVIS WITHOUT CONTRAST TECHNIQUE: Multidetector CT imaging of the abdomen and pelvis was performed following the standard protocol without IV contrast. COMPARISON:  None. FINDINGS: Lower chest:  No acute finding Hepatobiliary: Hepatic steatosis.Cholelithiasis. No evidence of cholecystitis. Pancreas: Unremarkable. Spleen: Unremarkable. Adrenals/Urinary Tract: Negative adrenals. No hydronephrosis or stone. Unremarkable bladder. Stomach/Bowel: No obstruction or inflammatory changes. No appendicitis. Vascular/Lymphatic:  No acute vascular abnormality. Scattered atherosclerotic calcification of the aorta. No mass or adenopathy. Reproductive:Negative Other: No ascites or pneumoperitoneum.  Tiny umbilical hernia. Musculoskeletal: No acute finding. IMPRESSION: 1. No acute finding. 2. Hepatic steatosis. 3. Cholelithiasis. Electronically Signed   By: Monte Fantasia M.D.   On: 09/11/2017 21:30   Dg Abd 1 View  Result Date: 09/11/2017 CLINICAL DATA:  Abdominal distension. EXAM: ABDOMEN - 1 VIEW COMPARISON:  None. FINDINGS: The bowel gas pattern is normal. No radio-opaque calculi or other significant radiographic abnormality are seen. Probable phleboliths in the left pelvis. IMPRESSION: Negative. Electronically Signed   By: Fidela Salisbury M.D.   On: 09/11/2017 15:14    Assessment/Plan: 3 Days Post-Op   Active Problems:   S/P ACL reconstruction   Patient ready for discharge home.   His knee pain and abdominal distension have improved.   Patient has had a bowel movement.  Patient will remain on ECASA 325 mg PO BID for DVT prophylaxis.  He will follow up with me in the office next week.     Thornton Park , MD 09/13/2017, 3:08 PM

## 2017-09-13 NOTE — Progress Notes (Signed)
Physical Therapy Treatment Patient Details Name: Peter Rose MRN: [id number] DOB: Nov 04, 1955 Today's Date: 09/13/2017    History of Present Illness Pt. is a 61 y.o. male who was admitted to [facility] for  ACL reconstruction of the right knee. Pt. sustained the knee injury after falling out of a crane. Pt. also sustained a right elbow injury, and anticipates having to have it surgically repaired when he has recovered from the knee surgery. Pt. PMHx includes: Psoriasis.    PT Comments    Patient completing all functional mobility with RW, mod indep level this date; good ability to maintain NWB L LE, good control/balance throughout.  Patient voices understanding of all information provided; comfortable with performance and discharge needs. No further questions/concerns at this time.    Follow Up Recommendations  DC plan and follow up therapy as arranged by surgeon;Home health PT     Equipment Recommendations  Rolling walker with 5" wheels;Other (comment)    Recommendations for Other Services       Precautions / Restrictions Precautions Precautions: Fall Required Braces or Orthoses: Knee Immobilizer - Right Knee Immobilizer - Right: On at all times Restrictions Weight Bearing Restrictions: Yes RLE Weight Bearing: Non weight bearing Other Position/Activity Restrictions: per telephone clarification with Dr. Mack Guise, patient okay for toe touch WBing for balance, but does not wish for patient to put weight of body/load leg    Mobility  Bed Mobility Overal bed mobility: Modified Independent                Transfers Overall transfer level: Modified independent Equipment used: Rolling walker (2 wheeled) Transfers: Sit to/from Stand              Ambulation/Gait Ambulation/Gait assistance: Modified independent (Device/Increase time) Ambulation Distance (Feet): 50 Feet Assistive device: Rolling walker (2 wheeled)       General Gait Details: 3-point, step to gait  pattern; good UE strength, control; good balance. Maintains NWB R LE 100%    Stairs            Wheelchair Mobility    Modified Rankin (Stroke Patients Only)       Balance Overall balance assessment: Needs assistance Sitting-balance support: No upper extremity supported;Feet supported Sitting balance-Leahy Scale: Good     Standing balance support: Bilateral upper extremity supported Standing balance-Leahy Scale: Good                              Cognition Arousal/Alertness: Awake/alert Behavior During Therapy: WFL for tasks assessed/performed Overall Cognitive Status: Within Functional Limits for tasks assessed                                        Exercises Other Exercises Other Exercises: Supine R LE therex, 1x10: quad sets, hip abduct/adduct and act assist SLR Other Exercises: Verbally reviewed use and management of KI (at all times), polar care (encouraged to have physician flip ice park part upside down for easier access at home). Patient voiced understanding of all information. Other Exercises: Discussed technique for car transfers and access to elevated bed surface (simulates with close sup for completion); patient voiced understanding.    General Comments        Pertinent Vitals/Pain Pain Assessment: Faces Faces Pain Scale: Hurts little more Pain Location: R k nee Pain Descriptors / Indicators: Aching;Grimacing;Guarding Pain Intervention(s): Limited activity within  patient's tolerance;Monitored during session;Premedicated before session;Repositioned    Home Living                      Prior Function            PT Goals (current goals can now be found in the care plan section) Acute Rehab PT Goals Patient Stated Goal: To be able to return to work without pain PT Goal Formulation: With patient Time For Goal Achievement: 09/25/17 Potential to Achieve Goals: Good Progress towards PT goals: Progressing toward  goals    Frequency    BID      PT Plan Current plan remains appropriate    Co-evaluation              AM-PAC PT "6 Clicks" Daily Activity  Outcome Measure  Difficulty turning over in bed (including adjusting bedclothes, sheets and blankets)?: None Difficulty moving from lying on back to sitting on the side of the bed? : None Difficulty sitting down on and standing up from a chair with arms (e.g., wheelchair, bedside commode, etc,.)?: None Help needed moving to and from a bed to chair (including a wheelchair)?: None Help needed walking in hospital room?: None Help needed climbing 3-5 steps with a railing? : A Little 6 Click Score: 23    End of Session Equipment Utilized During Treatment: Gait belt Activity Tolerance: Patient tolerated treatment well Patient left: in bed;with call bell/phone within reach Nurse Communication: Mobility status PT Visit Diagnosis: Difficulty in walking, not elsewhere classified (R26.2);Muscle weakness (generalized) (M62.81);Pain Pain - Right/Left: Right Pain - part of body: Knee     Time: 3009-2330 PT Time Calculation (min) (ACUTE ONLY): 20 min  Charges:  $Gait Training: 8-22 mins                    G Codes:       Harlym Gehling H. Owens Shark, PT, DPT, NCS 09/13/17, 2:08 PM [phone]

## 2017-09-16 ENCOUNTER — Telehealth: Payer: Self-pay

## 2017-09-16 NOTE — Telephone Encounter (Signed)
Patient Name: Peter Rose Gender: Male DOB: 06/14/1956 Age: 61 Y 1 D Return Phone Number: [phone] (Primary) Address: City/State/Zip: [place] [address] Client [facility] Night - Client Client Site [place] Physician Arnette Norris - MD Contact Type Call Who Is Calling Patient / Member / Family / Caregiver Call Type Triage / Clinical Relationship To Patient Self Return Phone Number [phone] (Primary) Chief Complaint BREATHING - shortness of breath or sounds breathless Reason for Call Symptomatic / Request for [facility] just had ACL surgery, he is having hard time breathing, having hiccups Translation No Nurse Assessment Nurse: Christel Mormon, RN, Levada Dy Date/Time (Eastern Time): 09/15/2017 2:49:41 PM Confirm and document reason for call. If symptomatic, describe symptoms. ---Caller just had ACL surgery on Tuesday. He had a hard time breathing while in hospital and the doctor knew about it. He feels like there is a scratch in his throat from anesthesia. He states it feels like reflux an it's locking him up and make it hard to breathe. He has also having bad hiccups. He said all the issues due to the breathing, are from the scratch. He states when he takes a breath, it locks him up for 8-10 seconds before the air comes through. Does the patient have any new or worsening symptoms? ---Yes Will a triage be completed? ---Yes Related visit to physician within the last 2 weeks? ---Yes Does the PT have any chronic conditions? (i.e. diabetes, asthma, etc.) ---No Is this a behavioral health or substance abuse call? ---No Guidelines Guideline Title Affirmed Question Affirmed Notes Nurse Date/Time (Eastern Time) Breathing Difficulty [1] MODERATE difficulty breathing (e.g., speaks in phrases, SOB even at rest, pulse 100-120) AND [2] NEWonset or WORSE than normal [place], Therapist, sports, Levada Dy 09/15/2017 2:52:36  PM PLEASE NOTE: All timestamps contained within this report are represented as [place] Standard Time. CONFIDENTIALTY NOTICE: This fax transmission is intended only for the addressee. It contains information that is legally privileged, confidential or otherwise protected from use or disclosure. If you are not the intended recipient, you are strictly prohibited from reviewing, disclosing, copying using or disseminating any of this information or taking any action in reliance on or regarding this information. If you have received this fax in error, please notify us immediately by telephone so that we can arrange for its return to [place]. Phone: [phone], Toll-Free: [phone], Fax: [phone] Page: 2 of 2 Call Id: [id number] [place]. Time Eilene Ghazi Time) Disposition Final User 09/15/2017 2:47:15 PM Send to Urgent Queue Jearld Shines 09/15/2017 2:54:54 PM Go to ED Now Yes Christel Mormon, RN, Marin Shutter Disagree/Comply Comply Caller Understands Yes PreDisposition Did not know what to do Care Advice Given Per Guideline GO TO ED NOW: You need to be seen in the Emergency Department. Go to the ER at ___________ [place] now. Drive carefully. CARE ADVICE given per Breathing Difficulty (Adult) guideline. NOTE TO TRIAGER - DRIVING: * Another adult should drive. * If immediate transportation is not available via car or taxi, then the patient should be instructed to call [phone]. BRING MEDICINES: * Please bring a list of your current medicines when you go to the Emergency Department (ER). CALL EMS 911 IF: you become worse. Referrals [facility] - ED

## 2017-09-16 NOTE — Telephone Encounter (Signed)
Patient did not go to ER.  States today that he is better and thinks the symptoms are related to his GI complications following anesthesia.  He was following a GI in the hospital prior to being discharged.    He is still having the hiccups with abdominal bloating but denies pains and is having regular BM's and passing gas.  He did discuss today with his surgeon at his f/u visit.  Ortho surgeon recommend he f/u with GI if his symptoms do not continue to improve.  He denies any SOB and does state overall improvement from yesterday.  He has no questions or concerns for [place] at this point and thanks me for the follow up call.  I will forward to his PCP, Dr. Deborra Medina at her new office.  Thanks.

## 2017-09-17 NOTE — Telephone Encounter (Signed)
PEC called and pt received 2 bills; one from [facility] and another for [facility] DOS 09/02/17. PEC will give pt billing #.

## 2017-09-17 NOTE — Telephone Encounter (Signed)
Noted. Thank you.  Please call to check on patient.

## 2017-09-18 NOTE — Telephone Encounter (Signed)
TA-Pt states that he will have a slow recovery and is going to PT/he declined an appt but states that he will call if he needs anything/he said "Thank you"/thx dmf

## 2017-12-24 ENCOUNTER — Ambulatory Visit (INDEPENDENT_AMBULATORY_CARE_PROVIDER_SITE_OTHER): Payer: Worker's Compensation | Admitting: Internal Medicine

## 2017-12-24 ENCOUNTER — Encounter: Payer: Self-pay | Admitting: Internal Medicine

## 2017-12-24 VITALS — BP 146/96 | HR 91 | Temp 98.2°F | Wt 228.0 lb

## 2017-12-24 DIAGNOSIS — B86 Scabies: Secondary | ICD-10-CM | POA: Diagnosis not present

## 2017-12-24 MED ORDER — IVERMECTIN 3 MG PO TABS: 200.0000 ug/kg | ORAL_TABLET | Freq: Once | ORAL | 0 refills | Status: DC

## 2017-12-24 NOTE — Progress Notes (Signed)
Subjective:    Patient ID: Peter Rose, male    DOB: 03/23/1956, 62 y.o.   MRN: [id number]  HPI  Pt presents to the clinic today with c/o a rash. He noticed this 2 days ago. The rash started on his arms and has spread to his groin, lower abdomen and buttocks. The rash is very itchy. He denies changes in soaps, lotions or detergents. He has been in contact with someone diagnosed with scabies. He would like a RX for oral Ivermectin today.  Review of Systems      Past Medical History:  Diagnosis Date  . Medical history non-contributory   . Psoriasis     Current Outpatient Medications  Medication Sig Dispense Refill  . levocetirizine (XYZAL) 5 MG tablet take 1 tablet by mouth once daily every evening 30 tablet 1  . methotrexate (RHEUMATREX) 2.5 MG tablet take 10 tablets by mouth every week 40 tablet 3  . sildenafil (VIAGRA) 100 MG tablet take 1 tablet by mouth EVERY 36 HOURS IF NEEDED 8 tablet 0  . traZODone (DESYREL) 50 MG tablet Take 0.5-1 tablets (25-50 mg total) by mouth at bedtime as needed for sleep. 30 tablet 3   No current facility-administered medications for this visit.     Allergies  Allergen Reactions  . Codeine Other (See Comments)    feels wired and jittery  . Other Other (See Comments)    HYCODAN: Makes pt feel weird/jittery  . Vicodin [Hydrocodone-Acetaminophen] Other (See Comments)    Pt feels "wired" with pain meds    Family History  Problem Relation Age of Onset  . Headache Neg Hx     Social History   Socioeconomic History  . Marital status: Single    Spouse name: Not on file  . Number of children: 3  . Years of education: 4  . Highest education level: Not on file  Social Needs  . Financial resource strain: Not on file  . Food insecurity - worry: Not on file  . Food insecurity - inability: Not on file  . Transportation needs - medical: Not on file  . Transportation needs - non-medical: Not on file  Occupational History  . Occupation: All  [employer]  Tobacco Use  . Smoking status: Former Smoker    Packs/day: 1.00    Last attempt to quit: 01/06/2017    Years since quitting: 0.9  . Smokeless tobacco: Never Used  Substance and Sexual Activity  . Alcohol use: Yes    Comment: occasional  . Drug use: No  . Sexual activity: Not on file  Other Topics Concern  . Not on file  Social History Narrative   Lives alone   Caffeine use:  2 cups - coffee   Tea daily     Constitutional: Denies fever, malaise, fatigue, headache or abrupt weight changes.  Skin: Pt reports rash.    No other specific complaints in a complete review of systems (except as listed in HPI above).  Objective:   Physical Exam  BP (!) 146/96   Pulse 91   Temp 98.2 F (36.8 C) (Oral)   Wt 228 lb (103.4 kg)   SpO2 98%   BMI 32.71 kg/m  Wt Readings from Last 3 Encounters:  12/24/17 228 lb (103.4 kg)  09/10/17 231 lb (104.8 kg)  09/02/17 231 lb (104.8 kg)    General: Appears his stated age, in NAD. Skin: Sporadic scabbed papular lesions on erythematous base noted on abdomen, groin, buttocks and forearms.  BMET  Component Value Date/Time   NA 134 (L) 09/12/2017 0355   K 3.7 09/12/2017 0355   CL 100 (L) 09/12/2017 0355   CO2 26 09/12/2017 0355   GLUCOSE 129 (H) 09/12/2017 0355   BUN 31 (H) 09/12/2017 0355   CREATININE 1.28 (H) 09/12/2017 0355   CALCIUM 9.1 09/12/2017 0355   GFRNONAA 59 (L) 09/12/2017 0355   GFRAA >60 09/12/2017 0355    Lipid Panel     Component Value Date/Time   CHOL 176 12/26/2016 0827   TRIG 276.0 (H) 12/26/2016 0827   HDL 37.20 (L) 12/26/2016 0827   CHOLHDL 5 12/26/2016 0827   VLDL 55.2 (H) 12/26/2016 0827   LDLCALC 86 01/31/2016 0739    CBC    Component Value Date/Time   WBC 6.5 09/13/2017 0308   RBC 3.59 (L) 09/13/2017 0308   HGB 11.8 (L) 09/13/2017 0308   HCT 34.0 (L) 09/13/2017 0308   PLT 153 09/13/2017 0308   MCV 94.7 09/13/2017 0308   MCH 33.0 09/13/2017 0308   MCHC 34.9 09/13/2017 0308   RDW 14.3  09/13/2017 0308   LYMPHSABS 1.7 09/10/2017 0612   MONOABS 0.6 09/10/2017 0612   EOSABS 0.2 09/10/2017 0612   BASOSABS 0.1 09/10/2017 0612    Hgb A1C No results found for: HGBA1C          Assessment & Plan:   Scabies:  eRx for Ivermectin 200 mcg/kg today, repeat in 7 days Wash all clothes, bedding, sheets etc in extremely hot water  Return precautions discussed [name] Silversmith, NP

## 2017-12-24 NOTE — Patient Instructions (Signed)
Scabies, Adult Scabies is a skin condition that happens when very small insects get under the skin (infestation). This causes a rash and severe itchiness. Scabies can spread from person to person (is contagious). If you get scabies, it is common for others in your household to get scabies too. With proper treatment, symptoms usually go away in 2-4 weeks. Scabies usually does not cause lasting problems. What are the causes? This condition is caused by mites (Sarcoptes scabiei, or human itch mites) that can only be seen with a microscope. The mites get into the top layer of skin and lay eggs. Scabies can spread from person to person through:  Close contact with a person who has scabies.  Contact with infested items, such as towels, bedding, or clothing.  What increases the risk? This condition is more likely to develop in:  People who live in nursing homes and other extended-care facilities.  People who have sexual contact with a partner who has scabies.  Young children who attend child care facilities.  People who care for others who are at increased risk for scabies.  What are the signs or symptoms? Symptoms of this condition may include:  Severe itchiness. This is often worse at night.  A rash that includes tiny red bumps or blisters. The rash commonly occurs on the wrist, elbow, armpit, fingers, waist, groin, or buttocks. Bumps may form a line (burrow) in some areas.  Skin irritation. This can include scaly patches or sores.  How is this diagnosed? This condition is diagnosed with a physical exam. Your health care provider will look closely at your skin. In some cases, your health care provider may take a sample of your affected skin (skin scraping) and have it examined under a microscope. How is this treated? This condition may be treated with:  Medicated cream or lotion that kills the mites. This is spread on the entire body and left on for several hours. Usually, one treatment  with medicated cream or lotion is enough to kill all of the mites. In severe cases, the treatment may be repeated.  Medicated cream that relieves itching.  Medicines that help to relieve itching.  Medicines that kill the mites. This treatment is rarely used.  Follow these instructions at home:  Medicines  Take or apply over-the-counter and prescription medicines as told by your health care provider.  Apply medicated cream or lotion as told by your health care provider.  Do not wash off the medicated cream or lotion until the necessary amount of time has passed. Skin Care  Avoid scratching your affected skin.  Keep your fingernails closely trimmed to reduce injury from scratching.  Take cool baths or apply cool washcloths to help reduce itching. General instructions  Clean all items that you recently had contact with, including bedding, clothing, and furniture. Do this on the same day that your treatment starts. ? Use hot water when you wash items. ? Place unwashable items into closed, airtight plastic bags for at least 3 days. The mites cannot live for more than 3 days away from human skin. ? Vacuum furniture and mattresses that you use.  Make sure that other people who may have been infested are examined by a health care provider. These include members of your household and anyone who may have had contact with infested items.  Keep all follow-up visits as told by your health care provider. This is important. Contact a health care provider if:  You have itching that does not go away   after 4 weeks of treatment.  You continue to develop new bumps or burrows.  You have redness, swelling, or pain in your rash area after treatment.  You have fluid, blood, or pus coming from your rash. This information is not intended to replace advice given to you by your health care provider. Make sure you discuss any questions you have with your health care provider. Document Released:  06/22/2015 Document Revised: 03/08/2016 Document Reviewed: 05/03/2015 Elsevier Interactive Patient Education  2018 Elsevier Inc.  

## 2018-01-14 ENCOUNTER — Other Ambulatory Visit: Payer: Self-pay | Admitting: Internal Medicine

## 2018-02-03 ENCOUNTER — Other Ambulatory Visit: Payer: Self-pay | Admitting: Family Medicine

## 2018-03-24 ENCOUNTER — Other Ambulatory Visit: Payer: Self-pay | Admitting: Internal Medicine

## 2018-03-24 MED ORDER — IVERMECTIN 3 MG PO TABS: ORAL_TABLET | ORAL | 0 refills | Status: DC

## 2018-03-24 NOTE — Telephone Encounter (Signed)
Copied from [place] [id number]. Topic: Quick Communication - Rx Refill/Question >> Mar 24, 2018 12:14 PM Oliver Pila B wrote: Medication: ivermectin (STROMECTOL) 3 MG TABS tablet [[id number]]   Has the patient contacted their pharmacy? Yes.   (Agent: If no, request that the patient contact the pharmacy for the refill.) (Agent: If yes, when and what did the pharmacy advise?)  Preferred Pharmacy (with phone number or street name): [facility]  Agent: Please be advised that RX refills may take up to 3 business days. We ask that you follow-up with your pharmacy.

## 2018-03-24 NOTE — Telephone Encounter (Signed)
LOV 12-24-17, medication prescribed by [name] Silversmith / Per notes repeat medication in 7 days.  This request is 2 months later / Medication written:  Disp Refills Start End   ivermectin (STROMECTOL) 3 MG TABS tablet 14 tablet 0 01/14/2018    Sig: TAKE SEVEN TABLETS BY MOUTH ONCE FOR ONE DOSE. REPEAT IN 7 DAYS   Sent to pharmacy as: ivermectin (STROMECTOL) 3 MG Tab tablet   / Does [name] Silversmith want to provide a refill.  Patient has not have a follow up appointment

## 2018-03-24 NOTE — Telephone Encounter (Signed)
Dr. Deborra Medina please advise,   Pt saw [name] Silversmith on 12/2017 for this problem and since then she refill this med on 01/2018. Offer an appt but pt wants to see if Dr. Deborra Medina will be able to send this in (he is unemployed at the moment, cant afford ov).

## 2018-04-25 ENCOUNTER — Encounter: Payer: Self-pay | Admitting: Family Medicine

## 2018-05-23 ENCOUNTER — Other Ambulatory Visit: Payer: Self-pay | Admitting: Family Medicine

## 2018-11-21 ENCOUNTER — Other Ambulatory Visit: Payer: Self-pay | Admitting: *Deleted

## 2018-11-21 NOTE — Telephone Encounter (Signed)
Patient called to follow-up on a refill for Viagra that Medley was suppose to send over to you  on Wednesday.  Last refill 6/15/ 19 #8 Last office visit 12/24/17

## 2018-11-21 NOTE — Telephone Encounter (Signed)
This is Peter Rose pt, will forward

## 2018-11-24 ENCOUNTER — Telehealth: Payer: Self-pay

## 2018-11-24 NOTE — Telephone Encounter (Signed)
Pt states he has switched to NP [facility].

## 2018-11-24 NOTE — Telephone Encounter (Signed)
Which refill? I think I must not be able to see refill requests or I am looking in the wrong place?

## 2018-11-24 NOTE — Telephone Encounter (Addendum)
Pt is calling asking for status on refill request. [See refill, 11/21/18.]  Fwd to Dr. Deborra Medina.

## 2018-11-24 NOTE — Telephone Encounter (Signed)
He has not switched to me. I only saw him once for an acute visit

## 2018-11-25 NOTE — Telephone Encounter (Signed)
Can you Peter help with this ?

## 2018-11-25 NOTE — Telephone Encounter (Signed)
Yes okay to refill viagra one time only.

## 2018-11-25 NOTE — Telephone Encounter (Signed)
TA-Looks like you last saw this patient 11.2018/the request is for Viagra/plz advise/thx dmf

## 2018-11-26 MED ORDER — SILDENAFIL CITRATE 100 MG PO TABS: ORAL_TABLET | ORAL | 0 refills | Status: DC

## 2018-11-26 NOTE — Telephone Encounter (Signed)
Sent in 1 fill with note in SIG and to Pharm stating that needs OV for future fills/thx dmf

## 2018-11-26 NOTE — Addendum Note (Signed)
Addended by: Marrion Coy on: 11/26/2018 09:59 AM   Modules accepted: Orders

## 2018-12-11 ENCOUNTER — Other Ambulatory Visit: Payer: Self-pay | Admitting: Family Medicine

## 2018-12-12 NOTE — Telephone Encounter (Signed)
Has not seen Dr. Deborra Medina since 11.2018/thx dmf

## 2018-12-29 ENCOUNTER — Telehealth: Payer: Self-pay | Admitting: *Deleted

## 2018-12-29 ENCOUNTER — Ambulatory Visit (INDEPENDENT_AMBULATORY_CARE_PROVIDER_SITE_OTHER): Payer: Self-pay | Admitting: Internal Medicine

## 2018-12-29 ENCOUNTER — Encounter: Payer: Self-pay | Admitting: Internal Medicine

## 2018-12-29 ENCOUNTER — Other Ambulatory Visit: Payer: Self-pay

## 2018-12-29 VITALS — BP 136/92 | HR 81 | Temp 98.3°F | Ht 69.0 in | Wt 236.0 lb

## 2018-12-29 DIAGNOSIS — F419 Anxiety disorder, unspecified: Secondary | ICD-10-CM

## 2018-12-29 DIAGNOSIS — L409 Psoriasis, unspecified: Secondary | ICD-10-CM

## 2018-12-29 DIAGNOSIS — F5101 Primary insomnia: Secondary | ICD-10-CM

## 2018-12-29 DIAGNOSIS — N529 Male erectile dysfunction, unspecified: Secondary | ICD-10-CM

## 2018-12-29 DIAGNOSIS — R030 Elevated blood-pressure reading, without diagnosis of hypertension: Secondary | ICD-10-CM

## 2018-12-29 MED ORDER — METHOTREXATE 2.5 MG PO TABS: ORAL_TABLET | ORAL | 2 refills | Status: DC

## 2018-12-29 MED ORDER — SILDENAFIL CITRATE 100 MG PO TABS: ORAL_TABLET | ORAL | 5 refills | Status: DC

## 2018-12-29 MED ORDER — TRAZODONE HCL 50 MG PO TABS: 25.0000 mg | ORAL_TABLET | Freq: Every evening | ORAL | 2 refills | Status: DC | PRN

## 2018-12-29 NOTE — Telephone Encounter (Signed)
Patient called stating that he was in earlier today and a script was sent in for his Methotrexate. Patient stated that his pharmacy told him that they received a script for just a 3 week supply and he always get a 4 week supply. Patient stated that he went ahead and picked the 3 week supply up and wanted to let you know that this needs to be corrected for the next refill and changed to a 4 week supply. Marland Kitchen

## 2018-12-29 NOTE — Assessment & Plan Note (Signed)
Currently not an issue Will monitor 

## 2018-12-29 NOTE — Progress Notes (Signed)
HPI  Pt presents to the clinic today to establish care and for management of the conditions listed below. He is transferring care from Dr. Deborra Medina.  Anxiety: Currently not an issue.  Insomnia. He has trouble staying asleep. He takes Trazadone as needed with good relief. He would like a refill of this today. There is no sleep study on file.  Psoriasis: Managed on MTX as needed.He would like a refill of this today. He follows with dermatology as needed.  ED: He has difficulty maintaining an erection. He takes Sildenafil as needed with good results. He would like a refill of this today.   Elevated Blood Pressure: His BP today is 136/92. He has never been diagnosed with HTN in the past.  Flu: never Tetanus: 05/2012 Zostavax: never Shingrix: never PSA Screening: 12/2016 Colon Screening: never Vision Screening: annually Dentist: as needed  Past Medical History:  Diagnosis Date  . Medical history non-contributory   . Psoriasis     Current Outpatient Medications  Medication Sig Dispense Refill  . ivermectin (STROMECTOL) 3 MG TABS tablet TAKE SEVEN TABLETS BY MOUTH ONCE FOR ONE DOSE. REPEAT IN 7 DAYS 14 tablet 0  . levocetirizine (XYZAL) 5 MG tablet TAKE 1 TABLET BY MOUTH EVERY EVENING 30 tablet PRN  . methotrexate (RHEUMATREX) 2.5 MG tablet TAKE 10 TABLETS BY MOUTH ONCE A WEEK 40 tablet 3  . sildenafil (VIAGRA) 100 MG tablet take 1 tablet by mouth EVERY 36 HOURS Iprn (needs OV for future fills 8 tablet 0  . traZODone (DESYREL) 50 MG tablet Take 0.5-1 tablets (25-50 mg total) by mouth at bedtime as needed for sleep. 30 tablet 3   No current facility-administered medications for this visit.     Allergies  Allergen Reactions  . Codeine Other (See Comments)    feels wired and jittery  . Other Other (See Comments)    HYCODAN: Makes pt feel weird/jittery  . Vicodin [Hydrocodone-Acetaminophen] Other (See Comments)    Pt feels "wired" with pain meds    Family History  Problem Relation  Age of Onset  . Headache Neg Hx     Social History   Socioeconomic History  . Marital status: Single    Spouse name: Not on file  . Number of children: 3  . Years of education: 64  . Highest education level: Not on file  Occupational History  . Occupation: All [employer]  . Financial resource strain: Not on file  . Food insecurity:    Worry: Not on file    Inability: Not on file  . Transportation needs:    Medical: Not on file    Non-medical: Not on file  Tobacco Use  . Smoking status: Former Smoker    Packs/day: 1.00    Last attempt to quit: 01/06/2017    Years since quitting: 1.9  . Smokeless tobacco: Never Used  Substance and Sexual Activity  . Alcohol use: Yes    Comment: occasional  . Drug use: No  . Sexual activity: Not on file  Lifestyle  . Physical activity:    Days per week: Not on file    Minutes per session: Not on file  . Stress: Not on file  Relationships  . Social connections:    Talks on phone: Not on file    Gets together: Not on file    Attends religious service: Not on file    Active member of club or organization: Not on file    Attends meetings of clubs  or organizations: Not on file    Relationship status: Not on file  . Intimate partner violence:    Fear of current or ex partner: Not on file    Emotionally abused: Not on file    Physically abused: Not on file    Forced sexual activity: Not on file  Other Topics Concern  . Not on file  Social History Narrative   Lives alone   Caffeine use:  2 cups - coffee   Tea daily    ROS:  Constitutional: Denies fever, malaise, fatigue, headache or abrupt weight changes.  HEENT: Denies eye pain, eye redness, ear pain, ringing in the ears, wax buildup, runny nose, nasal congestion, bloody nose, or sore throat. Respiratory: Denies difficulty breathing, shortness of breath, cough or sputum production.   Cardiovascular: Denies chest pain, chest tightness, palpitations or swelling in the hands  or feet.  Gastrointestinal: Denies abdominal pain, bloating, constipation, diarrhea or blood in the stool.  GU: Pt reports erectile dysfunction. Denies frequency, urgency, pain with urination, blood in urine, odor or discharge. Musculoskeletal: Denies decrease in range of motion, difficulty with gait, muscle pain or joint pain and swelling.  Skin: Pt reports psoriasis. Denies ulcercations.  Neurological: Pt reports insomnia. Denies dizziness, difficulty with memory, difficulty with speech or problems with balance and coordination.  Psych: Denies anxiety, depression, SI/HI.  No other specific complaints in a complete review of systems (except as listed in HPI above).  PE:  BP (!) 136/92   Pulse 81   Temp 98.3 F (36.8 C) (Oral)   Ht 5\' 9"  (1.753 m)   Wt 236 lb (107 kg)   SpO2 98%   BMI 34.85 kg/m   Wt Readings from Last 3 Encounters:  12/24/17 228 lb (103.4 kg)  09/10/17 231 lb (104.8 kg)  09/02/17 231 lb (104.8 kg)    General: Appears his stated age, obese in NAD. Skin: Psoriasis patches noted on bilateral flanks.  Cardiovascular: Normal rate and rhythm. S1,S2 noted.  No murmur, rubs or gallops noted.  Pulmonary/Chest: Normal effort and positive vesicular breath sounds. No respiratory distress. No wheezes, rales or ronchi noted.  Neurological: Alert and oriented.  Psychiatric: Mood and affect normal. Behavior is normal. Judgment and thought content normal.     BMET    Component Value Date/Time   NA 134 (L) 09/12/2017 0355   K 3.7 09/12/2017 0355   CL 100 (L) 09/12/2017 0355   CO2 26 09/12/2017 0355   GLUCOSE 129 (H) 09/12/2017 0355   BUN 31 (H) 09/12/2017 0355   CREATININE 1.28 (H) 09/12/2017 0355   CALCIUM 9.1 09/12/2017 0355   GFRNONAA 59 (L) 09/12/2017 0355   GFRAA >60 09/12/2017 0355    Lipid Panel     Component Value Date/Time   CHOL 176 12/26/2016 0827   TRIG 276.0 (H) 12/26/2016 0827   HDL 37.20 (L) 12/26/2016 0827   CHOLHDL 5 12/26/2016 0827   VLDL  55.2 (H) 12/26/2016 0827   LDLCALC 86 01/31/2016 0739    CBC    Component Value Date/Time   WBC 6.5 09/13/2017 0308   RBC 3.59 (L) 09/13/2017 0308   HGB 11.8 (L) 09/13/2017 0308   HCT 34.0 (L) 09/13/2017 0308   PLT 153 09/13/2017 0308   MCV 94.7 09/13/2017 0308   MCH 33.0 09/13/2017 0308   MCHC 34.9 09/13/2017 0308   RDW 14.3 09/13/2017 0308   LYMPHSABS 1.7 09/10/2017 0612   MONOABS 0.6 09/10/2017 0612   EOSABS 0.2 09/10/2017 0612  BASOSABS 0.1 09/10/2017 0612    Hgb A1C No results found for: HGBA1C   Assessment and Plan:  Elevated Blood Pressure:  Will monitor for now If elevated at next visit, will discuss antihypertensive therapy Discussed DASH diet and exercise for weight loss  Make an appt for your annual exam [name] Silversmith, NP

## 2018-12-29 NOTE — Patient Instructions (Signed)
Managing Your Hypertension  Hypertension is commonly called high blood pressure. This is when the force of your blood pressing against the walls of your arteries is too strong. Arteries are blood vessels that carry blood from your heart throughout your body. Hypertension forces the heart to work harder to pump blood, and may cause the arteries to become narrow or stiff. Having untreated or uncontrolled hypertension can cause heart attack, stroke, kidney disease, and other problems.  What are blood pressure readings?  A blood pressure reading consists of a higher number over a lower number. Ideally, your blood pressure should be below 120/80. The first ("top") number is called the systolic pressure. It is a measure of the pressure in your arteries as your heart beats. The second ("bottom") number is called the diastolic pressure. It is a measure of the pressure in your arteries as the heart relaxes.  What does my blood pressure reading mean?  Blood pressure is classified into four stages. Based on your blood pressure reading, your health care provider may use the following stages to determine what type of treatment you need, if any. Systolic pressure and diastolic pressure are measured in a unit called mm Hg.  Normal   Systolic pressure: below 120.   Diastolic pressure: below 80.  Elevated   Systolic pressure: 120-129.   Diastolic pressure: below 80.  Hypertension stage 1   Systolic pressure: 130-139.   Diastolic pressure: 80-89.  Hypertension stage 2   Systolic pressure: 140 or above.   Diastolic pressure: 90 or above.  What health risks are associated with hypertension?  Managing your hypertension is an important responsibility. Uncontrolled hypertension can lead to:   A heart attack.   A stroke.   A weakened blood vessel (aneurysm).   Heart failure.   Kidney damage.   Eye damage.   Metabolic syndrome.   Memory and concentration problems.  What changes can I make to manage my  hypertension?  Hypertension can be managed by making lifestyle changes and possibly by taking medicines. Your health care provider will help you make a plan to bring your blood pressure within a normal range.  Eating and drinking     Eat a diet that is high in fiber and potassium, and low in salt (sodium), added sugar, and fat. An example eating plan is called the DASH (Dietary Approaches to Stop Hypertension) diet. To eat this way:  ? Eat plenty of fresh fruits and vegetables. Try to fill half of your plate at each meal with fruits and vegetables.  ? Eat whole grains, such as whole wheat pasta, brown rice, or whole grain bread. Fill about one quarter of your plate with whole grains.  ? Eat low-fat diary products.  ? Avoid fatty cuts of meat, processed or cured meats, and poultry with skin. Fill about one quarter of your plate with lean proteins such as fish, chicken without skin, beans, eggs, and tofu.  ? Avoid premade and processed foods. These tend to be higher in sodium, added sugar, and fat.   Reduce your daily sodium intake. Most people with hypertension should eat less than 1,500 mg of sodium a day.   Limit alcohol intake to no more than 1 drink a day for nonpregnant women and 2 drinks a day for men. One drink equals 12 oz of beer, 5 oz of wine, or 1 oz of hard liquor.  Lifestyle   Work with your health care provider to maintain a healthy body weight, or to lose   weight. Ask what an ideal weight is for you.   Get at least 30 minutes of exercise that causes your heart to beat faster (aerobic exercise) most days of the week. Activities may include walking, swimming, or biking.   Include exercise to strengthen your muscles (resistance exercise), such as weight lifting, as part of your weekly exercise routine. Try to do these types of exercises for 30 minutes at least 3 days a week.   Do not use any products that contain nicotine or tobacco, such as cigarettes and e-cigarettes. If you need help quitting,  ask your health care provider.   Control any long-term (chronic) conditions you have, such as high cholesterol or diabetes.  Monitoring   Monitor your blood pressure at home as told by your health care provider. Your personal target blood pressure may vary depending on your medical conditions, your age, and other factors.   Have your blood pressure checked regularly, as often as told by your health care provider.  Working with your health care provider   Review all the medicines you take with your health care provider because there may be side effects or interactions.   Talk with your health care provider about your diet, exercise habits, and other lifestyle factors that may be contributing to hypertension.   Visit your health care provider regularly. Your health care provider can help you create and adjust your plan for managing hypertension.  Will I need medicine to control my blood pressure?  Your health care provider may prescribe medicine if lifestyle changes are not enough to get your blood pressure under control, and if:   Your systolic blood pressure is 130 or higher.   Your diastolic blood pressure is 80 or higher.  Take medicines only as told by your health care provider. Follow the directions carefully. Blood pressure medicines must be taken as prescribed. The medicine does not work as well when you skip doses. Skipping doses also puts you at risk for problems.  Contact a health care provider if:   You think you are having a reaction to medicines you have taken.   You have repeated (recurrent) headaches.   You feel dizzy.   You have swelling in your ankles.   You have trouble with your vision.  Get help right away if:   You develop a severe headache or confusion.   You have unusual weakness or numbness, or you feel faint.   You have severe pain in your chest or abdomen.   You vomit repeatedly.   You have trouble breathing.  Summary   Hypertension is when the force of blood pumping  through your arteries is too strong. If this condition is not controlled, it may put you at risk for serious complications.   Your personal target blood pressure may vary depending on your medical conditions, your age, and other factors. For most people, a normal blood pressure is less than 120/80.   Hypertension is managed by lifestyle changes, medicines, or both. Lifestyle changes include weight loss, eating a healthy, low-sodium diet, exercising more, and limiting alcohol.  This information is not intended to replace advice given to you by your health care provider. Make sure you discuss any questions you have with your health care provider.  Document Released: 06/25/2012 Document Revised: 08/29/2016 Document Reviewed: 08/29/2016  Elsevier Interactive Patient Education  2019 [employer] Inc[employer]

## 2018-12-29 NOTE — Assessment & Plan Note (Signed)
Sildenafil refilled today Will monitor

## 2018-12-29 NOTE — Assessment & Plan Note (Signed)
Continue MTX as needed, refilled today Will check CMET at annual exam

## 2018-12-29 NOTE — Telephone Encounter (Signed)
This was changed on the [facility]

## 2018-12-29 NOTE — Addendum Note (Signed)
Addended by: Jearld Fenton on: 12/29/2018 02:07 PM   Modules accepted: Orders

## 2018-12-29 NOTE — Assessment & Plan Note (Signed)
Discussed the importance of a sleep routine Trazadone refilled today

## 2019-02-20 NOTE — Telephone Encounter (Signed)
Pt is at [address] now and methotrexate 2.5 mg  Is still qty # 30.Pt is out of med and # 40 only cost $36.24 and # 30 cost $35.44. From 12/29/18 phone note I see that qty was changed on [facility] but do not see that [facility] [address] was notified. I spoke with pharmacist Alyse Low at [facility] [address] and changed methotrexate 2.5 mg qty to # 40  as instructed by R baity NP note on 12/29/18. Alyse Low said pt did not have any refills left so I advised pt that Alyse Low said methotrexate 2.5 mg # 40 would be ready for pickup in 15-20" since pt is still waiting at pharmacy.and when pt needs next refill to contact [facility] 3 days ahead of pt needing refill so pt will not get out of medication. Pt appreciative and voiced understanding.

## 2019-03-03 ENCOUNTER — Other Ambulatory Visit: Payer: Self-pay

## 2019-03-03 MED ORDER — METHOTREXATE 2.5 MG PO TABS: ORAL_TABLET | ORAL | 2 refills | Status: DC

## 2019-03-03 NOTE — Telephone Encounter (Signed)
Last filled at est care appt 12/29/2018... please advise

## 2019-03-17 ENCOUNTER — Telehealth: Payer: Self-pay | Admitting: Internal Medicine

## 2019-03-17 MED ORDER — LEVOCETIRIZINE DIHYDROCHLORIDE 5 MG PO TABS: 5.0000 mg | ORAL_TABLET | Freq: Every evening | ORAL | 5 refills | Status: DC

## 2019-03-17 NOTE — Telephone Encounter (Signed)
Pt called checking on rx [facility] stated they  Faxed 5/28  levocetirizine  [facility] [address]  Best number [phone]  Pt is out of meds  Please advise when called in

## 2019-03-17 NOTE — Telephone Encounter (Signed)
We never received refill... Rx sent through [facility]

## 2019-04-06 ENCOUNTER — Other Ambulatory Visit: Payer: Self-pay

## 2019-04-06 MED ORDER — METHOTREXATE 2.5 MG PO TABS: ORAL_TABLET | ORAL | 2 refills | Status: DC

## 2019-04-07 MED ORDER — METHOTREXATE 2.5 MG PO TABS: ORAL_TABLET | ORAL | 2 refills | Status: DC

## 2019-04-07 NOTE — Addendum Note (Signed)
Addended by: Lurlean Nanny on: 04/07/2019 10:39 AM   Modules accepted: Orders

## 2019-05-19 ENCOUNTER — Other Ambulatory Visit: Payer: Self-pay | Admitting: Internal Medicine

## 2019-09-01 ENCOUNTER — Other Ambulatory Visit: Payer: Self-pay | Admitting: Internal Medicine

## 2019-10-06 ENCOUNTER — Ambulatory Visit (INDEPENDENT_AMBULATORY_CARE_PROVIDER_SITE_OTHER): Payer: Self-pay | Admitting: Internal Medicine

## 2019-10-06 ENCOUNTER — Encounter: Payer: Self-pay | Admitting: Internal Medicine

## 2019-10-06 DIAGNOSIS — B9789 Other viral agents as the cause of diseases classified elsewhere: Secondary | ICD-10-CM

## 2019-10-06 DIAGNOSIS — J329 Chronic sinusitis, unspecified: Secondary | ICD-10-CM

## 2019-10-06 MED ORDER — PREDNISONE 10 MG PO TABS: ORAL_TABLET | ORAL | 0 refills | Status: DC

## 2019-10-06 NOTE — Patient Instructions (Signed)

## 2019-10-06 NOTE — Progress Notes (Signed)
Virtual Visit via Video Note  I connected with Peter Rose on 10/06/19 at 10:00 AM EST by a video enabled telemedicine application and verified that I am speaking with the correct person using two identifiers.  Location: Patient: Home Provider: Office   I discussed the limitations of evaluation and management by telemedicine and the availability of in person appointments. The patient expressed understanding and agreed to proceed.  History of Present Illness:  Pt reports headache, sinus pressure, watery eyes and runny nose. This started 3 days ago. The headache is located in his forehead and behind his eyes. He describes the pain as pressure. He denies dizziness, visual changes, sensitivity to light or sound. The sinus pressure is mostly in his cheeks. He is blowing clear mucous out of his nose. He denies fever, nasal congestion, ear pain, sore throat, loss of taste or smell, cough or SOB. He has tried Mucinex Max, allergy medicine and Flonase OTC with minimal relief. He has not had sick contacts that he is aware of.   Past Medical History:  Diagnosis Date  . Medical history non-contributory   . Psoriasis     Current Outpatient Medications  Medication Sig Dispense Refill  . levocetirizine (XYZAL) 5 MG tablet Take 1 tablet (5 mg total) by mouth every evening. MUST SCHEDULE PHYSICAL 90 tablet 0  . methotrexate 2.5 MG tablet TAKE 10 TABLETS BY MOUTH ONCE WEEKLY *MUST SCHEDULE PHYSICAL 40 tablet 0  . sildenafil (VIAGRA) 100 MG tablet take 1 tablet by mouth EVERY 36 HOURS 10 tablet 5  . traZODone (DESYREL) 50 MG tablet TAKE 1/2 TO 1 (ONE-HALF TO ONE) TABLET BY MOUTH AT BEDTIME AS NEEDED FOR SLEEP 30 tablet 0   No current facility-administered medications for this visit.    Allergies  Allergen Reactions  . Codeine Other (See Comments)    feels wired and jittery  . Other Other (See Comments)    HYCODAN: Makes pt feel weird/jittery  . Vicodin [Hydrocodone-Acetaminophen] Other (See  Comments)    Pt feels "wired" with pain meds    Family History  Problem Relation Age of Onset  . Headache Neg Hx     Social History   Socioeconomic History  . Marital status: Single    Spouse name: Not on file  . Number of children: 3  . Years of education: 74  . Highest education level: Not on file  Occupational History  . Occupation: [employer]  Tobacco Use  . Smoking status: Former Smoker    Packs/day: 1.00    Quit date: 01/06/2017    Years since quitting: 2.7  . Smokeless tobacco: Never Used  Substance and Sexual Activity  . Alcohol use: Yes    Comment: occasional  . Drug use: No  . Sexual activity: Not on file  Other Topics Concern  . Not on file  Social History Narrative   Lives alone   Caffeine use:  2 cups - coffee   Tea daily   Social Determinants of Health   Financial Resource Strain:   . Difficulty of Paying Living Expenses: Not on file  Food Insecurity:   . Worried About Charity fundraiser in the Last Year: Not on file  . Ran Out of Food in the Last Year: Not on file  Transportation Needs:   . Lack of Transportation (Medical): Not on file  . Lack of Transportation (Non-Medical): Not on file  Physical Activity:   . Days of Exercise per Week: Not on file  .  Minutes of Exercise per Session: Not on file  Stress:   . Feeling of Stress : Not on file  Social Connections:   . Frequency of Communication with Friends and Family: Not on file  . Frequency of Social Gatherings with Friends and Family: Not on file  . Attends Religious Services: Not on file  . Active Member of Clubs or Organizations: Not on file  . Attends Archivist Meetings: Not on file  . Marital Status: Not on file  Intimate Partner Violence:   . Fear of Current or Ex-Partner: Not on file  . Emotionally Abused: Not on file  . Physically Abused: Not on file  . Sexually Abused: Not on file     Constitutional: Pt reports headache. Denies fever, malaise, fatigue,  or abrupt  weight changes.  HEENT: Pt reports sinus pressure, watery eyes and runny nose. Denies eye pain, eye redness, ear pain, ringing in the ears, wax buildup, nasal congestion, bloody nose, or sore throat. Respiratory: Denies difficulty breathing, shortness of breath, cough or sputum production.   Cardiovascular: Denies chest pain, chest tightness, palpitations or swelling in the hands or feet.   No other specific complaints in a complete review of systems (except as listed in HPI above).  Observations/Objective:   Wt Readings from Last 3 Encounters:  12/29/18 236 lb (107 kg)  12/24/17 228 lb (103.4 kg)  09/10/17 231 lb (104.8 kg)    General: Appears his stated age, obese, in NAD. HEENT: Head: normal shape and size; Nose: no congestion noted; Throat/Mouth: No hoarseness noted. Pulmonary/Chest: Normal effort. No respiratory distress.  Neurological: Alert and oriented.   BMET    Component Value Date/Time   NA 134 (L) 09/12/2017 0355   K 3.7 09/12/2017 0355   CL 100 (L) 09/12/2017 0355   CO2 26 09/12/2017 0355   GLUCOSE 129 (H) 09/12/2017 0355   BUN 31 (H) 09/12/2017 0355   CREATININE 1.28 (H) 09/12/2017 0355   CALCIUM 9.1 09/12/2017 0355   GFRNONAA 59 (L) 09/12/2017 0355   GFRAA >60 09/12/2017 0355    Lipid Panel     Component Value Date/Time   CHOL 176 12/26/2016 0827   TRIG 276.0 (H) 12/26/2016 0827   HDL 37.20 (L) 12/26/2016 0827   CHOLHDL 5 12/26/2016 0827   VLDL 55.2 (H) 12/26/2016 0827   LDLCALC 86 01/31/2016 0739    CBC    Component Value Date/Time   WBC 6.5 09/13/2017 0308   RBC 3.59 (L) 09/13/2017 0308   HGB 11.8 (L) 09/13/2017 0308   HCT 34.0 (L) 09/13/2017 0308   PLT 153 09/13/2017 0308   MCV 94.7 09/13/2017 0308   MCH 33.0 09/13/2017 0308   MCHC 34.9 09/13/2017 0308   RDW 14.3 09/13/2017 0308   LYMPHSABS 1.7 09/10/2017 0612   MONOABS 0.6 09/10/2017 0612   EOSABS 0.2 09/10/2017 0612   BASOSABS 0.1 09/10/2017 0612    Hgb A1C No results found for:  HGBA1C      Assessment and Plan:  Viral Sinusitis:  RX for Pred Taper x 6 days Continue antihistamine and Flonase OTC No indication for abx at this time  Return precautions disucssed  Follow Up Instructions:    I discussed the assessment and treatment plan with the patient. The patient was provided an opportunity to ask questions and all were answered. The patient agreed with the plan and demonstrated an understanding of the instructions.   The patient was advised to call back or seek an in-person evaluation if  the symptoms worsen or if the condition fails to improve as anticipated.     [name] Silversmith, NP

## 2019-10-17 ENCOUNTER — Encounter: Payer: Self-pay | Admitting: Emergency Medicine

## 2019-10-17 ENCOUNTER — Ambulatory Visit
Admission: EM | Admit: 2019-10-17 | Discharge: 2019-10-17 | Disposition: A | Discharge status: Home / Self Care | Payer: HRSA Program | Attending: Family Medicine | Admitting: Family Medicine

## 2019-10-17 ENCOUNTER — Other Ambulatory Visit: Payer: Self-pay

## 2019-10-17 DIAGNOSIS — J0100 Acute maxillary sinusitis, unspecified: Secondary | ICD-10-CM | POA: Diagnosis present

## 2019-10-17 DIAGNOSIS — Z9109 Other allergy status, other than to drugs and biological substances: Secondary | ICD-10-CM | POA: Diagnosis not present

## 2019-10-17 DIAGNOSIS — Z87891 Personal history of nicotine dependence: Secondary | ICD-10-CM

## 2019-10-17 DIAGNOSIS — U071 COVID-19: Secondary | ICD-10-CM | POA: Diagnosis not present

## 2019-10-17 DIAGNOSIS — Z792 Long term (current) use of antibiotics: Secondary | ICD-10-CM | POA: Diagnosis not present

## 2019-10-17 DIAGNOSIS — N529 Male erectile dysfunction, unspecified: Secondary | ICD-10-CM | POA: Insufficient documentation

## 2019-10-17 DIAGNOSIS — Z79899 Other long term (current) drug therapy: Secondary | ICD-10-CM | POA: Insufficient documentation

## 2019-10-17 DIAGNOSIS — Z885 Allergy status to narcotic agent status: Secondary | ICD-10-CM | POA: Insufficient documentation

## 2019-10-17 MED ORDER — AMOXICILLIN-POT CLAVULANATE 875-125 MG PO TABS: 1.0000 | ORAL_TABLET | Freq: Two times a day (BID) | ORAL | 0 refills | Status: DC

## 2019-10-17 NOTE — ED Provider Notes (Signed)
[facility]    CSN: [id number] Arrival date & time: 10/17/19  [id number]  History   Chief Complaint Chief Complaint  Patient presents with  . Sinus Problem  . Cough  . Nasal Congestion  . Headache   HPI  64 year old male presents with the above complaints.  Patient reports sinus pressure and congestion, runny nose, headache for the past 2 weeks.  Has now developed cough.  Denies fever.  Diagnosed with viral sinusitis on 12/22.  Has recently been treated with prednisone and has had no resolution of his symptoms.  Patient believes he has sinusitis.  No recent exposure to COVID-19.  No known exacerbating or relieving factors.  No other reported symptoms.  No other complaints.  PMH, Surgical Hx, Family Hx, Social History reviewed and updated as below.  PMH: Patient Active Problem List   Diagnosis Date Noted  . Erectile dysfunction 12/29/2018  . Insomnia 12/26/2016  . Anxiety 12/26/2016  . Psoriasis     Past Surgical History:  Procedure Laterality Date  . ELBOW SURGERY Right    ligament repar 11/2017  . KNEE ARTHROSCOPY WITH ANTERIOR CRUCIATE LIGAMENT (ACL) REPAIR WITH HAMSTRING GRAFT Right 09/10/2017   Procedure: KNEE ARTHROSCOPY WITH ANTERIOR CRUCIATE LIGAMENT (ACL) REPAIR WITH HAMSTRING GRAFT AND  ALLOGRAFT ,& MEDIAL MENISCECTOMY;  Surgeon: Thornton Park, MD;  Location: [facility] ORS;  Service: Orthopedics;  Laterality: Right;       Home Medications    Prior to Admission medications   Medication Sig Start Date End Date Taking? Authorizing Provider  Ascorbic Acid (VITAMIN C) 1000 MG tablet Take 1,000 mg by mouth daily.   Yes [provider]  levocetirizine (XYZAL) 5 MG tablet Take 1 tablet (5 mg total) by mouth every evening. MUST SCHEDULE PHYSICAL 09/03/19  Yes Jearld Fenton, NP  methotrexate 2.5 MG tablet TAKE 10 TABLETS BY MOUTH ONCE WEEKLY *MUST SCHEDULE PHYSICAL 09/03/19  Yes Jearld Fenton, NP  traZODone (DESYREL) 50 MG tablet TAKE 1/2 TO 1 (ONE-HALF  TO ONE) TABLET BY MOUTH AT BEDTIME AS NEEDED FOR SLEEP 09/03/19  Yes Jearld Fenton, NP  amoxicillin-clavulanate (AUGMENTIN) 875-125 MG tablet Take 1 tablet by mouth every 12 (twelve) hours. 10/17/19   Coral Spikes, DO  sildenafil (VIAGRA) 100 MG tablet take 1 tablet by mouth EVERY 36 HOURS 12/29/18   Jearld Fenton, NP    Family History Family History  Problem Relation Age of Onset  . Headache Neg Hx     Social History Social History   Tobacco Use  . Smoking status: Former Smoker    Packs/day: 1.00    Quit date: 01/06/2017    Years since quitting: 2.7  . Smokeless tobacco: Never Used  Substance Use Topics  . Alcohol use: Yes    Comment: occasional  . Drug use: No     Allergies   Codeine, Other, and Vicodin [hydrocodone-acetaminophen]   Review of Systems Review of Systems  Constitutional: Negative for fever.  HENT: Positive for congestion, sinus pressure and sinus pain.   Respiratory: Positive for cough.   Neurological: Positive for headaches.   Physical Exam Triage Vital Signs ED Triage Vitals  Enc Vitals Group     BP 10/17/19 1013 (!) 145/102     Pulse Rate 10/17/19 1013 95     Resp 10/17/19 1013 16     Temp 10/17/19 1013 98.4 F (36.9 C)     Temp Source 10/17/19 1013 Oral     SpO2 10/17/19 1013 96 %  Weight 10/17/19 1009 235 lb 14.3 oz (107 kg)     Height 10/17/19 1009 5\' 10"  (1.778 m)     Head Circumference --      Peak Flow --      Pain Score 10/17/19 1009 8     Pain Loc --      Pain Edu? --      Excl. in [place]? --    Updated Vital Signs BP (!) 145/102 (BP Location: Right Arm)   Pulse 95   Temp 98.4 F (36.9 C) (Oral)   Resp 16   Ht 5\' 10"  (1.778 m)   Wt 107 kg   SpO2 96%   BMI 33.85 kg/m   Visual Acuity Right Eye Distance:   Left Eye Distance:   Bilateral Distance:    Right Eye Near:   Left Eye Near:    Bilateral Near:     Physical Exam Vitals and nursing note reviewed.  Constitutional:      General: He is not in acute distress.     Appearance: Normal appearance. He is not ill-appearing.  HENT:     Head: Normocephalic and atraumatic.     Nose:     Comments: Maxillary sinus tenderness to palpation. Eyes:     General:        Right eye: No discharge.        Left eye: No discharge.     Conjunctiva/sclera: Conjunctivae normal.  Cardiovascular:     Rate and Rhythm: Normal rate and regular rhythm.     Heart sounds: No murmur.  Pulmonary:     Effort: Pulmonary effort is normal.     Breath sounds: Normal breath sounds. No wheezing, rhonchi or rales.  Neurological:     Mental Status: He is alert.  Psychiatric:        Mood and Affect: Mood normal.        Behavior: Behavior normal.     UC Treatments / Results  Labs (all labs ordered are listed, but only abnormal results are displayed) Labs Reviewed  NOVEL CORONAVIRUS, NAA (HOSP ORDER, SEND-OUT TO REF LAB; TAT 18-24 HRS)    EKG   Radiology No results found.  Procedures Procedures (including critical care time)  Medications Ordered in UC Medications - No data to display  Initial Impression / Assessment and Plan / UC Course  I have reviewed the triage vital signs and the nursing notes.  Pertinent labs & imaging results that were available during my care of the patient were reviewed by me and considered in my medical decision making (see chart for details).    64 year old male presents with sinusitis.  Treating with Augmentin.  Awaiting Covid test results.  Final Clinical Impressions(s) / UC Diagnoses   Final diagnoses:  Acute maxillary sinusitis, recurrence not specified   Discharge Instructions   None    ED Prescriptions    Medication Sig Dispense Auth. Provider   amoxicillin-clavulanate (AUGMENTIN) 875-125 MG tablet Take 1 tablet by mouth every 12 (twelve) hours. 20 tablet Coral Spikes, DO     PDMP not reviewed this encounter.   Coral Spikes, [place] 10/17/19 1418

## 2019-10-17 NOTE — ED Triage Notes (Signed)
Patient c/o sinus congestion, runny nose, HAs, and cough for 2 weeks.  Patient has not check his temperature.  Patient was given Prednisone on Dec. 18.

## 2019-10-19 ENCOUNTER — Telehealth: Payer: Self-pay | Admitting: Emergency Medicine

## 2019-10-19 NOTE — Telephone Encounter (Signed)
Pt called requesting covid test inturrputation from mychart. Instructed pt his  COVID-19 was positive, meaning that he was infected with the novel coronavirus and could give the germ to others.  Please continue isolation at home, for at least 10 days since the start of your fever/cough/breathlessness and until you have had 3 consecutive days without fever (without taking a fever reducer) and with cough/breathlessness improving. Please continue good preventive care measures, including:  frequent hand-washing, avoid touching your face, cover coughs/sneezes, stay out of crowds and keep a 6 foot distance from others.  Recheck or go to the nearest hospital ED tent for re-assessment if fever/cough/breathlessness return.  Patient contacted and made aware of all results, all questions answered. Notified Laurina Bustle for any further paperwork that needs to be done for health department.

## 2019-10-21 LAB — NOVEL CORONAVIRUS, NAA (HOSP ORDER, SEND-OUT TO REF LAB; TAT 18-24 HRS): SARS-CoV-2, NAA: DETECTED — AB

## 2019-11-11 ENCOUNTER — Other Ambulatory Visit: Payer: Self-pay | Admitting: Internal Medicine

## 2019-11-27 ENCOUNTER — Other Ambulatory Visit: Payer: Self-pay | Admitting: Internal Medicine

## 2019-12-17 ENCOUNTER — Encounter: Payer: Self-pay | Admitting: Internal Medicine

## 2019-12-17 ENCOUNTER — Other Ambulatory Visit: Payer: Self-pay

## 2019-12-17 ENCOUNTER — Ambulatory Visit (INDEPENDENT_AMBULATORY_CARE_PROVIDER_SITE_OTHER): Payer: Self-pay | Admitting: Internal Medicine

## 2019-12-17 VITALS — BP 142/86 | HR 66 | Temp 97.9°F | Ht 68.75 in | Wt 216.0 lb

## 2019-12-17 DIAGNOSIS — Z125 Encounter for screening for malignant neoplasm of prostate: Secondary | ICD-10-CM

## 2019-12-17 DIAGNOSIS — Z0000 Encounter for general adult medical examination without abnormal findings: Secondary | ICD-10-CM

## 2019-12-17 DIAGNOSIS — I10 Essential (primary) hypertension: Secondary | ICD-10-CM | POA: Insufficient documentation

## 2019-12-17 DIAGNOSIS — F419 Anxiety disorder, unspecified: Secondary | ICD-10-CM

## 2019-12-17 DIAGNOSIS — L409 Psoriasis, unspecified: Secondary | ICD-10-CM

## 2019-12-17 DIAGNOSIS — R972 Elevated prostate specific antigen [PSA]: Secondary | ICD-10-CM

## 2019-12-17 DIAGNOSIS — F5101 Primary insomnia: Secondary | ICD-10-CM

## 2019-12-17 DIAGNOSIS — N529 Male erectile dysfunction, unspecified: Secondary | ICD-10-CM

## 2019-12-17 LAB — COMPREHENSIVE METABOLIC PANEL
ALT: 33 U/L (ref 0–53)
AST: 33 U/L (ref 0–37)
Albumin: 4.4 g/dL (ref 3.5–5.2)
Alkaline Phosphatase: 50 U/L (ref 39–117)
BUN: 13 mg/dL (ref 6–23)
CO2: 29 mEq/L (ref 19–32)
Calcium: 9.8 mg/dL (ref 8.4–10.5)
Chloride: 104 mEq/L (ref 96–112)
Creatinine, Ser: 1.08 mg/dL (ref 0.40–1.50)
GFR: 68.78 mL/min (ref >60.00)
Glucose, Bld: 96 mg/dL (ref 70–99)
Potassium: 4.7 mEq/L (ref 3.5–5.1)
Sodium: 140 mEq/L (ref 135–145)
Total Bilirubin: 0.8 mg/dL (ref 0.2–1.2)
Total Protein: 7.5 g/dL (ref 6.0–8.3)

## 2019-12-17 LAB — HEMOGLOBIN A1C: Hgb A1c MFr Bld: 5.4 % (ref 4.6–6.5)

## 2019-12-17 LAB — CBC
HCT: 41.7 % (ref 39.0–52.0)
Hemoglobin: 14.2 g/dL (ref 13.0–17.0)
MCHC: 34.2 g/dL (ref 30.0–36.0)
MCV: 92 fl (ref 78.0–100.0)
Platelets: 203 10*3/uL (ref 150.0–400.0)
RBC: 4.53 Mil/uL (ref 4.22–5.81)
RDW: 14.2 % (ref 11.5–15.5)
WBC: 4.9 10*3/uL (ref 4.0–10.5)

## 2019-12-17 LAB — LIPID PANEL
Cholesterol: 156 mg/dL (ref 0–200)
HDL: 42.3 mg/dL
LDL Cholesterol: 94 mg/dL (ref 0–99)
NonHDL: 113.7
Total CHOL/HDL Ratio: 4
Triglycerides: 97 mg/dL (ref 0.0–149.0)
VLDL: 19.4 mg/dL (ref 0.0–40.0)

## 2019-12-17 LAB — PSA: PSA: 6.5 ng/mL — ABNORMAL HIGH (ref 0.10–4.00)

## 2019-12-17 MED ORDER — TRAZODONE HCL 100 MG PO TABS: ORAL_TABLET | ORAL | 5 refills | Status: DC

## 2019-12-17 MED ORDER — MOMETASONE FUROATE 50 MCG/ACT NA SUSP: 2.0000 | Freq: Every day | NASAL | 12 refills | Status: DC

## 2019-12-17 MED ORDER — HYDROXYZINE HCL 10 MG PO TABS: 10.0000 mg | ORAL_TABLET | Freq: Every day | ORAL | 0 refills | Status: DC | PRN

## 2019-12-17 NOTE — Assessment & Plan Note (Signed)
Support offered today RX for Hydroxyzine 10 mg daily prn

## 2019-12-17 NOTE — Progress Notes (Signed)
Subjective:    Patient ID: Peter Rose, male    DOB: [birth year], 64 y.o.   MRN: [id number]  HPI  Patient presents to the clinic today for his annual exam.  He is also due to follow-up chronic conditions.  Anxiety: He reports he gets intermittent anxiety during the day. He is not taking any medication for mood at this time but would like to know if there is something he can take on an as needed basis.  He does not follow with a therapist.  He denies depression, SI/HI.  Insomnia: He has trouble staying asleep.  He takes Trazodone as needed with minimal relief. He would like to know if his dose could be increased. There is no sleep study on file.  Psoriasis: Managed on Methotrexate as needed.  He does follow with dermatology.  ED: He has difficulty maintaining an erection.  Managed with Sildenafil as needed with good results.  He does not follow with urology.  HTN: His BP today is 142/86.  He has never taken oral antihypertensive medication. He feels like this increases with anxiety. ECG from 08/2017 reviewed.  Flu: never Tetanus: 05/2012 Zostavax: never Shingrix: never PSA screening: 12/2016 Colon screening: never Vision screening: annually Dentist: as needed  Diet: He does eat meat. He consumes fruits and veggies daily. He tries to avoid fried food. He drinks mostly water. Exercise: None  Review of Systems      Past Medical History:  Diagnosis Date  . Medical history non-contributory   . Psoriasis     Current Outpatient Medications  Medication Sig Dispense Refill  . amoxicillin-clavulanate (AUGMENTIN) 875-125 MG tablet Take 1 tablet by mouth every 12 (twelve) hours. 20 tablet 0  . Ascorbic Acid (VITAMIN C) 1000 MG tablet Take 1,000 mg by mouth daily.    Marland Kitchen levocetirizine (XYZAL) 5 MG tablet TAKE 1 TABLET BY MOUTH IN THE EVENING (MUST  SCHEDULE  PHYSICAL  EXAM) 90 tablet 0  . methotrexate 2.5 MG tablet TAKE 10 TABLETS BY MOUTH ONCE WEEKLY (MUST SCHEDULE PHYSICAL EXAM) 40  tablet 0  . sildenafil (VIAGRA) 100 MG tablet take 1 tablet by mouth EVERY 36 HOURS 10 tablet 5  . traZODone (DESYREL) 50 MG tablet TAKE 1/2 TO 1 (ONE-HALF TO ONE) TABLET BY MOUTH AT BEDTIME AS NEEDED FOR SLEEP (MUST  SCHEDULE  PHYSICAL  EXAM) 30 tablet 0   No current facility-administered medications for this visit.    Allergies  Allergen Reactions  . Codeine Other (See Comments)    feels wired and jittery  . Other Other (See Comments)    HYCODAN: Makes pt feel weird/jittery  . Vicodin [Hydrocodone-Acetaminophen] Other (See Comments)    Pt feels "wired" with pain meds    Family History  Problem Relation Age of Onset  . Headache Neg Hx     Social History   Socioeconomic History  . Marital status: Single    Spouse name: Not on file  . Number of children: 3  . Years of education: 64  . Highest education level: Not on file  Occupational History  . Occupation: [employer]  Tobacco Use  . Smoking status: Former Smoker    Packs/day: 1.00    Quit date: 01/06/2017    Years since quitting: 2.9  . Smokeless tobacco: Never Used  Substance and Sexual Activity  . Alcohol use: Yes    Comment: occasional  . Drug use: No  . Sexual activity: Not on file  Other Topics Concern  . Not  on file  Social History Narrative   Lives alone   Caffeine use:  2 cups - coffee   Tea daily   Social Determinants of Health   Financial Resource Strain:   . Difficulty of Paying Living Expenses: Not on file  Food Insecurity:   . Worried About Charity fundraiser in the Last Year: Not on file  . Ran Out of Food in the Last Year: Not on file  Transportation Needs:   . Lack of Transportation (Medical): Not on file  . Lack of Transportation (Non-Medical): Not on file  Physical Activity:   . Days of Exercise per Week: Not on file  . Minutes of Exercise per Session: Not on file  Stress:   . Feeling of Stress : Not on file  Social Connections:   . Frequency of Communication with Friends and Family:  Not on file  . Frequency of Social Gatherings with Friends and Family: Not on file  . Attends Religious Services: Not on file  . Active Member of Clubs or Organizations: Not on file  . Attends Archivist Meetings: Not on file  . Marital Status: Not on file  Intimate Partner Violence:   . Fear of Current or Ex-Partner: Not on file  . Emotionally Abused: Not on file  . Physically Abused: Not on file  . Sexually Abused: Not on file     Constitutional: Denies fever, malaise, fatigue, headache or abrupt weight changes.  HEENT: Denies eye pain, eye redness, ear pain, ringing in the ears, wax buildup, runny nose, nasal congestion, bloody nose, or sore throat. Respiratory: Denies difficulty breathing, shortness of breath, cough or sputum production.   Cardiovascular: Denies chest pain, chest tightness, palpitations or swelling in the hands or feet.  Gastrointestinal: Pt reports intermittent reflux. Denies abdominal pain, bloating, constipation, diarrhea or blood in the stool.  GU: Denies urgency, frequency, pain with urination, burning sensation, blood in urine, odor or discharge. Musculoskeletal: Denies decrease in range of motion, difficulty with gait, muscle pain or joint pain and swelling.  Skin: Denies redness, rashes, lesions or ulcercations.  Neurological: Pt reports insomnia. Denies dizziness, difficulty with memory, difficulty with speech or problems with balance and coordination.  Psych: Pt reports anxiety. Denies depression, SI/HI.  No other specific complaints in a complete review of systems (except as listed in HPI above).  Objective:   Physical Exam  BP (!) 142/86   Pulse 66   Temp 97.9 F (36.6 C) (Temporal)   Ht 5' 8.75" (1.746 m)   Wt 216 lb (98 kg)   SpO2 98%   BMI 32.13 kg/m   Wt Readings from Last 3 Encounters:  10/17/19 235 lb 14.3 oz (107 kg)  12/29/18 236 lb (107 kg)  12/24/17 228 lb (103.4 kg)    General: Appears his stated age, obese, in  NAD. Skin: Warm, dry and intact. No rashesnoted. HEENT: Head: normal shape and size; Eyes: sclera white, no icterus, conjunctiva pink, PERRLA and EOMs intact;  Neck:  Neck supple, trachea midline. No masses, lumps or thyromegaly present.  Cardiovascular: Normal rate and rhythm. S1,S2 noted.  No murmur, rubs or gallops noted. No JVD or BLE edema. No carotid bruits noted. Pulmonary/Chest: Normal effort and positive vesicular breath sounds. No respiratory distress. No wheezes, rales or ronchi noted.  Abdomen: Soft and nontender. Normal bowel sounds. No distention or masses noted. Liver, spleen and kidneys non palpable. Musculoskeletal: Strength 5/5 BUE/BLE. No difficulty with gait.  Neurological: Alert and oriented. Cranial  nerves II-XII grossly intact. Coordination normal.  Psychiatric: Mood and affect normal. Behavior is normal. Judgment and thought content normal.     BMET    Component Value Date/Time   NA 134 (L) 09/12/2017 0355   K 3.7 09/12/2017 0355   CL 100 (L) 09/12/2017 0355   CO2 26 09/12/2017 0355   GLUCOSE 129 (H) 09/12/2017 0355   BUN 31 (H) 09/12/2017 0355   CREATININE 1.28 (H) 09/12/2017 0355   CALCIUM 9.1 09/12/2017 0355   GFRNONAA 59 (L) 09/12/2017 0355   GFRAA >60 09/12/2017 0355    Lipid Panel     Component Value Date/Time   CHOL 176 12/26/2016 0827   TRIG 276.0 (H) 12/26/2016 0827   HDL 37.20 (L) 12/26/2016 0827   CHOLHDL 5 12/26/2016 0827   VLDL 55.2 (H) 12/26/2016 0827   LDLCALC 86 01/31/2016 0739    CBC    Component Value Date/Time   WBC 6.5 09/13/2017 0308   RBC 3.59 (L) 09/13/2017 0308   HGB 11.8 (L) 09/13/2017 0308   HCT 34.0 (L) 09/13/2017 0308   PLT 153 09/13/2017 0308   MCV 94.7 09/13/2017 0308   MCH 33.0 09/13/2017 0308   MCHC 34.9 09/13/2017 0308   RDW 14.3 09/13/2017 0308   LYMPHSABS 1.7 09/10/2017 0612   MONOABS 0.6 09/10/2017 0612   EOSABS 0.2 09/10/2017 0612   BASOSABS 0.1 09/10/2017 0612    Hgb A1C No results found for:  HGBA1C         Assessment & Plan:   Preventative Health Maintenance:  He declines flu shot Tetanus UTD He declines Zostavax or Shingrix at this time He declines colon cancer screening at this time Encouraged him to consume a balanced diet and exercise regimen Advised him to see an eye doctor and dentist annually Will check CBC, C met, lipid, A1c, PSA today  RTC in 1 year, sooner if needed [name] Silversmith, NP This visit occurred during the SARS-CoV-2 public health emergency.  Safety protocols were in place, including screening questions prior to the visit, additional usage of staff PPE, and extensive cleaning of exam room while observing appropriate contact time as indicated for disinfecting solutions.

## 2019-12-17 NOTE — Assessment & Plan Note (Signed)
Continue MTX as needed CMET today

## 2019-12-17 NOTE — Assessment & Plan Note (Signed)
Continue Sildenafil prn

## 2019-12-17 NOTE — Patient Instructions (Signed)

## 2019-12-17 NOTE — Assessment & Plan Note (Signed)
Will increase Trazadone to 100 mg QHS Will monitor

## 2019-12-17 NOTE — Assessment & Plan Note (Signed)
He wants to hold off on antihypertensive therapy at this time Will monitor

## 2019-12-18 NOTE — Addendum Note (Signed)
Addended by: Jearld Fenton on: 12/18/2019 10:27 AM   Modules accepted: Orders

## 2020-01-07 ENCOUNTER — Other Ambulatory Visit: Payer: Self-pay | Admitting: Internal Medicine

## 2020-01-08 NOTE — Telephone Encounter (Signed)
Viagra last filled 12/29/2018 with 5 refills... please advise

## 2020-01-14 ENCOUNTER — Other Ambulatory Visit: Payer: Self-pay | Admitting: Internal Medicine

## 2020-01-21 ENCOUNTER — Ambulatory Visit: Payer: Self-pay | Admitting: Urology

## 2020-02-18 ENCOUNTER — Ambulatory Visit (INDEPENDENT_AMBULATORY_CARE_PROVIDER_SITE_OTHER): Payer: Self-pay | Admitting: Urology

## 2020-02-18 ENCOUNTER — Other Ambulatory Visit: Payer: Self-pay

## 2020-02-18 VITALS — BP 162/102 | HR 68 | Ht 70.0 in | Wt 209.0 lb

## 2020-02-18 DIAGNOSIS — R972 Elevated prostate specific antigen [PSA]: Secondary | ICD-10-CM

## 2020-02-18 LAB — BLADDER SCAN AMB NON-IMAGING: Scan Result: 14

## 2020-02-18 NOTE — Patient Instructions (Signed)
Prostate Cancer Screening  Prostate cancer screening is a test that is done to check for the presence of prostate cancer in men. The prostate gland is a walnut-sized gland that is located below the bladder and in front of the rectum in males. The function of the prostate is to add fluid to semen during ejaculation. Prostate cancer is the second most common type of cancer in men. Who should have prostate cancer screening?  Screening recommendations vary based on age and other risk factors. Screening is recommended if:  You are older than age 55. If you are age 55-69, talk with your health care provider about your need for screening and how often screening should be done. Because most prostate cancers are slow growing and will not cause death, screening is generally reserved in this age group for men who have a 10-15-year life expectancy.  You are younger than age 55, and you have these risk factors: ? Being a black male or a male of African descent. ? Having a father, brother, or uncle who has been diagnosed with prostate cancer. The risk is higher if your family member's cancer occurred at an early age. Screening is not recommended if:  You are younger than age 40.  You are between the ages of 40 and 54 and you have no risk factors.  You are 65 years of age or older. At this age, the risks that screening can cause are greater than the benefits that it may provide. If you are at high risk for prostate cancer, your health care provider may recommend that you have screenings more often or that you start screening at a younger age. How is screening for prostate cancer done? The recommended prostate cancer screening test is a blood test called the prostate-specific antigen (PSA) test. PSA is a protein that is made in the prostate. As you age, your prostate naturally produces more PSA. Abnormally high PSA levels may be caused by:  Prostate cancer.  An enlarged prostate that is not caused by cancer  (benign prostatic hyperplasia, BPH). This condition is very common in older men.  A prostate gland infection (prostatitis). Depending on the PSA results, you may need more tests, such as:  A physical exam to check the size of your prostate gland.  Blood and imaging tests.  A procedure to remove tissue samples from your prostate gland for testing (biopsy). What are the benefits of prostate cancer screening?  Screening can help to identify cancer at an early stage, before symptoms start and when the cancer can be treated more easily.  There is a small chance that screening may lower your risk of dying from prostate cancer. The chance is small because prostate cancer is a slow-growing cancer, and most men with prostate cancer die from a different cause. What are the risks of prostate cancer screening? The main risk of prostate cancer screening is diagnosing and treating prostate cancer that would never have caused any symptoms or problems. This is called overdiagnosisand overtreatment. PSA screening cannot tell you if your PSA is high due to cancer or a different cause. A prostate biopsy is the only procedure to diagnose prostate cancer. Even the results of a biopsy may not tell you if your cancer needs to be treated. Slow-growing prostate cancer may not need any treatment other than monitoring, so diagnosing and treating it may cause unnecessary stress or other side effects. A prostate biopsy may also cause:  Infection or fever.  A false negative. This is   a result that shows that you do not have prostate cancer when you actually do have prostate cancer. Questions to ask your health care provider  When should I start prostate cancer screening?  What is my risk for prostate cancer?  How often do I need screening?  What type of screening tests do I need?  How do I get my test results?  What do my results mean?  Do I need treatment? Where to find more information  The American Cancer  Society: www.cancer.org  American Urological Association: www.auanet.org Contact a health care provider if:  You have difficulty urinating.  You have pain when you urinate or ejaculate.  You have blood in your urine or semen.  You have pain in your back or in the area of your prostate. Summary  Prostate cancer is a common type of cancer in men. The prostate gland is located below the bladder and in front of the rectum. This gland adds fluid to semen during ejaculation.  Prostate cancer screening may identify cancer at an early stage, when the cancer can be treated more easily.  The prostate-specific antigen (PSA) test is the recommended screening test for prostate cancer.  Discuss the risks and benefits of prostate cancer screening with your health care provider. If you are age 65 or older, the risks that screening can cause are greater than the benefits that it may provide. This information is not intended to replace advice given to you by your health care provider. Make sure you discuss any questions you have with your health care provider. Document Revised: 05/14/2019 Document Reviewed: 05/14/2019 Elsevier Patient Education  2020 [employer]  

## 2020-02-18 NOTE — Progress Notes (Signed)
02/18/20 1:08 PM   [name] 07-31-1956 [id number]  CC: Elevated PSA  HPI: I saw Mr. Macari in urology clinic today for evaluation of an elevated PSA.  He is a very healthy 64 year old male who has a long history of elevated PSAs have never been evaluated.  Most recent PSA was 6.5 in March 2021 which was slightly elevated from 5.8 in March 2018, 4.75 in April 2017, and 4.3 in August 2013.  He denies any history of prostate biopsy or any other urologic procedures.  He denies any urinary symptoms and IPSS score today is 0 with PVR of 15 mL.  He occasionally takes sildenafil for erections with good results.  He denies any family history of prostate or breast cancer.  There are no prior abdominal surgeries.  He does not currently have insurance, and would like to hold off on any testing until he has Medicare when he is 64.  He denies any history of UTIs or urinary retention.   On prior CT in 2018 prostate measures 37 g.   PMH: Past Medical History:  Diagnosis Date  . Medical history non-contributory   . Psoriasis     Surgical History: Past Surgical History:  Procedure Laterality Date  . ELBOW SURGERY Right    ligament repar 11/2017  . KNEE ARTHROSCOPY WITH ANTERIOR CRUCIATE LIGAMENT (ACL) REPAIR WITH HAMSTRING GRAFT Right 09/10/2017   Procedure: KNEE ARTHROSCOPY WITH ANTERIOR CRUCIATE LIGAMENT (ACL) REPAIR WITH HAMSTRING GRAFT AND  ALLOGRAFT ,& MEDIAL MENISCECTOMY;  Surgeon: Thornton Park, MD;  Location: [facility] ORS;  Service: Orthopedics;  Laterality: Right;    Family History: Family History  Problem Relation Age of Onset  . Headache Neg Hx     Social History:  reports that he quit smoking about 3 years ago. He smoked 1.00 pack per day. He has never used smokeless tobacco. He reports current alcohol use. He reports that he does not use drugs.  Physical Exam: BP (!) 162/102   Pulse 68   Ht 5\' 10"  (1.778 m)   Wt 209 lb (94.8 kg)   BMI 29.99 kg/m    Constitutional:   Alert and oriented, No acute distress. Cardiovascular: No clubbing, cyanosis, or edema. Respiratory: Normal respiratory effort, no increased work of breathing. GI: Abdomen is soft, nontender, nondistended, no abdominal masses DRE: 40 g, smooth, no nodules or masses  Laboratory Data: PSA history reviewed, see HPI  Pertinent Imaging: I have personally reviewed the CT from 2018, prostate measured 37 g, no other urologic abnormalities.  Assessment & Plan:   In summary, he is a healthy 64 year old male with no family history of prostate cancer and a long history of elevated PSA since 2013 with a continued very slow rise.  DRE is benign.  We reviewed the implications of an elevated PSA and the uncertainty surrounding it. In general, a man's PSA increases with age and is produced by both normal and cancerous prostate tissue. The differential diagnosis for elevated PSA includes BPH, prostate cancer, infection, recent intercourse/ejaculation, recent urethroscopic manipulation (foley placement/cystoscopy) or trauma, and prostatitis.   Management of an elevated PSA can include observation or prostate biopsy and we discussed this in detail. Our goal is to detect clinically significant prostate cancers, and manage with either active surveillance, surgery, or radiation for localized disease. Risks of prostate biopsy include bleeding, infection (including life threatening sepsis), pain, and lower urinary symptoms. Hematuria, hematospermia, and blood in the stool are all common after biopsy and can persist up to 4  weeks.   I recommended either a prostate biopsy or prostate MRI with his long history of elevated PSA and good health.  He opts for a prostate MRI as he would like to avoid biopsy if at all possible.  He would like to defer this until December of this year when he will have Medicare.  We discussed the low, but not 0, risk of missing opportunity for cure by delaying imaging, however his PSA trend is  reassuring.  Nickolas Madrid, MD 02/18/2020  [facility] [address] [place], [place] [address] [phone]

## 2020-03-23 ENCOUNTER — Other Ambulatory Visit: Payer: Self-pay | Admitting: Internal Medicine

## 2020-06-14 ENCOUNTER — Other Ambulatory Visit: Payer: Self-pay | Admitting: Internal Medicine

## 2020-06-15 NOTE — Telephone Encounter (Signed)
Patient left a voicemail stating that the pharmacy sent a refill request over yesterday for Viagra. Patient requested that the medication be refilled. Last refill 01/08/20 #10 Last office visit 12/17/19

## 2020-07-20 ENCOUNTER — Other Ambulatory Visit: Payer: Self-pay | Admitting: Internal Medicine

## 2020-07-21 NOTE — Telephone Encounter (Signed)
Last filled 06/16/2020...Marland Kitchen please advise

## 2020-08-16 ENCOUNTER — Other Ambulatory Visit: Payer: Self-pay

## 2020-08-16 ENCOUNTER — Ambulatory Visit (INDEPENDENT_AMBULATORY_CARE_PROVIDER_SITE_OTHER): Payer: Medicare HMO | Admitting: Internal Medicine

## 2020-08-16 ENCOUNTER — Encounter: Payer: Self-pay | Admitting: Internal Medicine

## 2020-08-16 VITALS — BP 136/90 | HR 99 | Temp 98.0°F | Wt 208.0 lb

## 2020-08-16 DIAGNOSIS — M5431 Sciatica, right side: Secondary | ICD-10-CM | POA: Diagnosis not present

## 2020-08-16 MED ORDER — PREDNISONE 10 MG PO TABS: ORAL_TABLET | ORAL | 0 refills | Status: DC

## 2020-08-16 MED ORDER — TRAMADOL HCL 50 MG PO TABS: 50.0000 mg | ORAL_TABLET | Freq: Three times a day (TID) | ORAL | 0 refills | Status: AC | PRN

## 2020-08-16 MED ORDER — SILDENAFIL CITRATE 100 MG PO TABS: 100.0000 mg | ORAL_TABLET | ORAL | 5 refills | Status: DC | PRN

## 2020-08-16 MED ORDER — CYCLOBENZAPRINE HCL 10 MG PO TABS
10.0000 mg | ORAL_TABLET | Freq: Three times a day (TID) | ORAL | 0 refills | Status: DC | PRN
Start: 2020-08-16 — End: 2020-08-22

## 2020-08-16 MED ORDER — KETOROLAC TROMETHAMINE 60 MG/2ML IM SOLN
60.0000 mg | Freq: Once | INTRAMUSCULAR | Status: AC
  Administered 2020-08-16: 60 mg via INTRAMUSCULAR

## 2020-08-16 NOTE — Patient Instructions (Signed)
Sciatica  Sciatica is pain, weakness, tingling, or loss of feeling (numbness) along the sciatic nerve. The sciatic nerve starts in the lower back and goes down the back of each leg. Sciatica usually goes away on its own or with treatment. Sometimes, sciatica may come back (recur). What are the causes? This condition happens when the sciatic nerve is pinched or has pressure put on it. This may be the result of:  A disk in between the bones of the spine bulging out too far (herniated disk).  Changes in the spinal disks that occur with aging.  A condition that affects a muscle in the butt.  Extra bone growth near the sciatic nerve.  A break (fracture) of the area between your hip bones (pelvis).  Pregnancy.  Tumor. This is rare. What increases the risk? You are more likely to develop this condition if you:  Play sports that put pressure or stress on the spine.  Have poor strength and ease of movement (flexibility).  Have had a back injury in the past.  Have had back surgery.  Sit for long periods of time.  Do activities that involve bending or lifting over and over again.  Are very overweight (obese). What are the signs or symptoms? Symptoms can vary from mild to very bad. They may include:  Any of these problems in the lower back, leg, hip, or butt: ? Mild tingling, loss of feeling, or dull aches. ? Burning sensations. ? Sharp pains.  Loss of feeling in the back of the calf or the sole of the foot.  Leg weakness.  Very bad back pain that makes it hard to move. These symptoms may get worse when you cough, sneeze, or laugh. They may also get worse when you sit or stand for long periods of time. How is this treated? This condition often gets better without any treatment. However, treatment may include:  Changing or cutting back on physical activity when you have pain.  Doing exercises and stretching.  Putting ice or heat on the affected area.  Medicines that  help: ? To relieve pain and swelling. ? To relax your muscles.  Shots (injections) of medicines that help to relieve pain, irritation, and swelling.  Surgery. Follow these instructions at home: Medicines  Take over-the-counter and prescription medicines only as told by your doctor.  Ask your doctor if the medicine prescribed to you: ? Requires you to avoid driving or using heavy machinery. ? Can cause trouble pooping (constipation). You may need to take these steps to prevent or treat trouble pooping:  Drink enough fluids to keep your pee (urine) pale yellow.  Take over-the-counter or prescription medicines.  Eat foods that are high in fiber. These include beans, whole grains, and fresh fruits and vegetables.  Limit foods that are high in fat and sugar. These include fried or sweet foods. Managing pain      If told, put ice on the affected area. ? Put ice in a plastic bag. ? Place a towel between your skin and the bag. ? Leave the ice on for 20 minutes, 2-3 times a day.  If told, put heat on the affected area. Use the heat source that your doctor tells you to use, such as a moist heat pack or a heating pad. ? Place a towel between your skin and the heat source. ? Leave the heat on for 20-30 minutes. ? Remove the heat if your skin turns bright red. This is very important if you are   unable to feel pain, heat, or cold. You may have a greater risk of getting burned. Activity   Return to your normal activities as told by your doctor. Ask your doctor what activities are safe for you.  Avoid activities that make your symptoms worse.  Take short rests during the day. ? When you rest for a long time, do some physical activity or stretching between periods of rest. ? Avoid sitting for a long time without moving. Get up and move around at least one time each hour.  Exercise and stretch regularly, as told by your doctor.  Do not lift anything that is heavier than 10 lb (4.5 kg)  while you have symptoms of sciatica. ? Avoid lifting heavy things even when you do not have symptoms. ? Avoid lifting heavy things over and over.  When you lift objects, always lift in a way that is safe for your body. To do this, you should: ? Bend your knees. ? Keep the object close to your body. ? Avoid twisting. General instructions  Stay at a healthy weight.  Wear comfortable shoes that support your feet. Avoid wearing high heels.  Avoid sleeping on a mattress that is too soft or too hard. You might have less pain if you sleep on a mattress that is firm enough to support your back.  Keep all follow-up visits as told by your doctor. This is important. Contact a doctor if:  You have pain that: ? Wakes you up when you are sleeping. ? Gets worse when you lie down. ? Is worse than the pain you have had in the past. ? Lasts longer than 4 weeks.  You lose weight without trying. Get help right away if:  You cannot control when you pee (urinate) or poop (have a bowel movement).  You have weakness in any of these areas and it gets worse: ? Lower back. ? The area between your hip bones. ? Butt. ? Legs.  You have redness or swelling of your back.  You have a burning feeling when you pee. Summary  Sciatica is pain, weakness, tingling, or loss of feeling (numbness) along the sciatic nerve.  This condition happens when the sciatic nerve is pinched or has pressure put on it.  Sciatica can cause pain, tingling, or loss of feeling (numbness) in the lower back, legs, hips, and butt.  Treatment often includes rest, exercise, medicines, and putting ice or heat on the affected area. This information is not intended to replace advice given to you by your health care provider. Make sure you discuss any questions you have with your health care provider. Document Revised: 10/20/2018 Document Reviewed: 10/20/2018 Elsevier Patient Education  2020 Elsevier Inc.  

## 2020-08-16 NOTE — Addendum Note (Signed)
Addended by: Pilar Grammes on: 08/16/2020 03:46 PM   Modules accepted: Orders

## 2020-08-16 NOTE — Progress Notes (Signed)
Subjective:    Patient ID: Peter Rose, male    DOB: October 16, 1955, 64 y.o.   MRN: [id number]  HPI  Pt presents to the clinic today with c/o pain in his right buttock. This started 2 weeks ago. He describes the pain as squeezing. The pain radiates into his right calf. He denies numbness, tingling or weakness of the right lower extremity. He denies low back pain, loss of bowel or bladder control. He has tried Ibuprofen, Goody Powder, Tylenol with minimal. 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  Review of Systems      Past Medical History:  Diagnosis Date  . Medical history non-contributory   . Psoriasis     Current Outpatient Medications  Medication Sig Dispense Refill  . Ascorbic Acid (VITAMIN C) 1000 MG tablet Take 1,000 mg by mouth daily.    . hydrOXYzine (ATARAX/VISTARIL) 10 MG tablet Take 1 tablet (10 mg total) by mouth daily as needed. (Patient not taking: Reported on 02/18/2020) 20 tablet 0  . ibuprofen (ADVIL) 800 MG tablet ibuprofen 800 mg tablet  take 1 tablet by mouth every 8 hours    . levocetirizine (XYZAL) 5 MG tablet Take 1 tablet (5 mg total) by mouth every evening. 90 tablet 1  . methotrexate 2.5 MG tablet TAKE 10 TABLETS BY MOUTH ONCE WEEKLY 40 tablet 5  . mometasone (NASONEX) 50 MCG/ACT nasal spray Place 2 sprays into the nose daily. 17 g 12  . sildenafil (VIAGRA) 100 MG tablet TAKE ONE TABLET BY MOUTH EVERY 36 HOURS 10 tablet 0  . traZODone (DESYREL) 100 MG tablet TAKE 1/2 TO 1 (ONE-HALF TO ONE) TABLET BY MOUTH AT BEDTIME AS NEEDED FOR SLEEP (MUST  SCHEDULE  PHYSICAL  EXAM) 30 tablet 5   No current facility-administered medications for this visit.    Allergies  Allergen Reactions  . Codeine Other (See Comments)    feels wired and jittery  . Other Other (See Comments)    HYCODAN: Makes pt feel weird/jittery  . Vicodin [Hydrocodone-Acetaminophen] Other (See Comments)    Pt feels "wired" with pain meds    Family History   Problem Relation Age of Onset  . Headache Neg Hx     Social History   Socioeconomic History  . Marital status: Single    Spouse name: Not on file  . Number of children: 3  . Years of education: 80  . Highest education level: Not on file  Occupational History  . Occupation: [employer]  Tobacco Use  . Smoking status: Former Smoker    Packs/day: 1.00    Quit date: 01/06/2017    Years since quitting: 3.6  . Smokeless tobacco: Never Used  Vaping Use  . Vaping Use: Never used  Substance and Sexual Activity  . Alcohol use: Yes    Comment: occasional  . Drug use: No  . Sexual activity: Not on file  Other Topics Concern  . Not on file  Social History Narrative   Lives alone   Caffeine use:  2 cups - coffee   Tea daily   Social Determinants of Health   Financial Resource Strain:   . Difficulty of Paying Living Expenses: Not on file  Food Insecurity:   . Worried About Charity fundraiser in the Last Year: Not on file  . Ran Out of Food in the Last Year: Not on file  Transportation Needs:   . Lack of Transportation (Medical): Not on file  .  Lack of Transportation (Non-Medical): Not on file  Physical Activity:   . Days of Exercise per Week: Not on file  . Minutes of Exercise per Session: Not on file  Stress:   . Feeling of Stress : Not on file  Social Connections:   . Frequency of Communication with Friends and Family: Not on file  . Frequency of Social Gatherings with Friends and Family: Not on file  . Attends Religious Services: Not on file  . Active Member of Clubs or Organizations: Not on file  . Attends Archivist Meetings: Not on file  . Marital Status: Not on file  Intimate Partner Violence:   . Fear of Current or Ex-Partner: Not on file  . Emotionally Abused: Not on file  . Physically Abused: Not on file  . Sexually Abused: Not on file     Constitutional: Denies fever, malaise, fatigue, headache or abrupt weight changes.  Respiratory: Denies  difficulty breathing, shortness of breath, cough or sputum production.   Cardiovascular: Denies chest pain, chest tightness, palpitations or swelling in the hands or feet.  Gastrointestinal: Denies abdominal pain, bloating, constipation, diarrhea or blood in the stool.  GU: Denies urgency, frequency, pain with urination, burning sensation, blood in urine, odor or discharge. Musculoskeletal: Pt reports buttock pain, right leg pain. Denies decrease in range of motion, difficulty with gait, or joint swelling.  Skin: Denies redness, rashes, lesions or ulcercations.  Neurological: Denies numbness, tingling, weakness or problems with balance and coordination.    No other specific complaints in a complete review of systems (except as listed in HPI above).  Objective:   Physical Exam BP 136/90 (BP Location: Left Arm, Patient Position: Sitting, Cuff Size: Large)   Pulse 99   Temp 98 F (36.7 C)   Wt 208 lb (94.3 kg)   SpO2 97%   BMI 29.84 kg/m   Wt Readings from Last 3 Encounters:  02/18/20 209 lb (94.8 kg)  12/17/19 216 lb (98 kg)  10/17/19 235 lb 14.3 oz (107 kg)    General: Appears his stated age, obese, in NAD. Skin: Warm, dry and intact. No rashes noted. HEENT: Head: normal shape and size;  Cardiovascular: Normal rate. Pulmonary/Chest: Normal effort. Musculoskeletal: Normal flexion, extension, rotation and lateral bending of the spine. No bony tenderness noted over the spine. Pain with palpation over the right SI joint. Strength 5/5 BLE. Able to stand on heels and tiptoes. No difficulty with gait.  Neurological: Alert and oriented. Positive SLR on the right at 30 degrees.   BMET    Component Value Date/Time   NA 140 12/17/2019 1124   K 4.7 12/17/2019 1124   CL 104 12/17/2019 1124   CO2 29 12/17/2019 1124   GLUCOSE 96 12/17/2019 1124   BUN 13 12/17/2019 1124   CREATININE 1.08 12/17/2019 1124   CALCIUM 9.8 12/17/2019 1124   GFRNONAA 59 (L) 09/12/2017 0355   GFRAA >60  09/12/2017 0355    Lipid Panel     Component Value Date/Time   CHOL 156 12/17/2019 1124   TRIG 97.0 12/17/2019 1124   HDL 42.30 12/17/2019 1124   CHOLHDL 4 12/17/2019 1124   VLDL 19.4 12/17/2019 1124   LDLCALC 94 12/17/2019 1124    CBC    Component Value Date/Time   WBC 4.9 12/17/2019 1124   RBC 4.53 12/17/2019 1124   HGB 14.2 12/17/2019 1124   HCT 41.7 12/17/2019 1124   PLT 203.0 12/17/2019 1124   MCV 92.0 12/17/2019 1124  MCH 33.0 09/13/2017 0308   MCHC 34.2 12/17/2019 1124   RDW 14.2 12/17/2019 1124   LYMPHSABS 1.7 09/10/2017 0612   MONOABS 0.6 09/10/2017 0612   EOSABS 0.2 09/10/2017 0612   BASOSABS 0.1 09/10/2017 0612    Hgb A1C Lab Results  Component Value Date   HGBA1C 5.4 12/17/2019           Assessment & Plan:   Right Sided Sciatica:  Toradol 60 mg IM x 1 RX for Pred Taper x 9 days, avoid NSAID's OTC RX for Flexeril 10 mg TID prn- sedation caution given RX for Tramadol 50 mg TID prn- sedation caution given Encouraged ice and stretching  Will pursue further workup if symptoms persist or worsen,  Return precautions discussed [name] Silversmith, NP This visit occurred during the SARS-CoV-2 public health emergency.  Safety protocols were in place, including screening questions prior to the visit, additional usage of staff PPE, and extensive cleaning of exam room while observing appropriate contact time as indicated for disinfecting solutions.

## 2020-08-18 ENCOUNTER — Telehealth: Payer: Self-pay | Admitting: Internal Medicine

## 2020-08-18 NOTE — Telephone Encounter (Signed)
Pt wanted to let regina know  The shot you gave him 11/2 helped yesterday no pain after shot but he went to grocery store last night and pain this morning is back. He is taking  Cyclobenzaprine, tramadol, prednisone.  Pain is not as bad as Tuesday morning.  He stated the thought about double up tramadol but he would run if he did that   What do you recommend pt do   Please advise

## 2020-08-18 NOTE — Telephone Encounter (Signed)
Try ice, don't double up on the Tramadol. Take Tylenol 1000 mg every 8 hours, continue prednisone and muscle relaxer

## 2020-08-19 NOTE — Telephone Encounter (Signed)
Peter Rose notified as instructed by telephone.  He states he is already icing but will add the Tylenol.  He ask for an appointment to be scheduled for Monday afternoon in case things do not improve over the weekend. [name] is out of the office on Monday so I put him with Dr. Lorelei Pont at 3:20 pm.  Patient will call back to cancel if he is doing better on Monday.

## 2020-08-19 NOTE — Telephone Encounter (Signed)
Pt called back wanting to know what to do  Best number [phone]

## 2020-08-22 ENCOUNTER — Ambulatory Visit (INDEPENDENT_AMBULATORY_CARE_PROVIDER_SITE_OTHER): Payer: Medicare HMO | Admitting: Family Medicine

## 2020-08-22 ENCOUNTER — Encounter: Payer: Self-pay | Admitting: Family Medicine

## 2020-08-22 ENCOUNTER — Other Ambulatory Visit: Payer: Self-pay

## 2020-08-22 VITALS — BP 160/102 | HR 90 | Temp 98.6°F | Ht 68.75 in | Wt 209.5 lb

## 2020-08-22 DIAGNOSIS — M5116 Intervertebral disc disorders with radiculopathy, lumbar region: Secondary | ICD-10-CM | POA: Diagnosis not present

## 2020-08-22 MED ORDER — TRAMADOL HCL 50 MG PO TABS
50.0000 mg | ORAL_TABLET | Freq: Three times a day (TID) | ORAL | 0 refills | Status: DC | PRN
Start: 2020-08-22 — End: 2020-09-02

## 2020-08-22 MED ORDER — PREDNISONE 20 MG PO TABS: ORAL_TABLET | ORAL | 0 refills | Status: DC

## 2020-08-22 MED ORDER — CYCLOBENZAPRINE HCL 10 MG PO TABS: 10.0000 mg | ORAL_TABLET | Freq: Three times a day (TID) | ORAL | 0 refills | Status: DC | PRN

## 2020-08-22 NOTE — Progress Notes (Signed)
Mikenna Bunkley T. Hortencia Martire, MD, [place] at [facility] [place], [address]  Phone: [phone]  FAX: [phone]  Peter Rose - 64 y.o. male  MRN [id number]  Date of Birth: November 19, 1955  Date: 08/22/2020  PCP: Jearld Fenton, NP  Referral: Jearld Fenton, NP  Chief Complaint  Patient presents with  . Sciatica    Right Side-Seen R. Baity on 08/16/20    This visit occurred during the SARS-CoV-2 public health emergency.  Safety protocols were in place, including screening questions prior to the visit, additional usage of staff PPE, and extensive cleaning of exam room while observing appropriate contact time as indicated for disinfecting solutions.   Subjective:   Peter Rose is a 64 y.o. very pleasant male patient with Body mass index is 31.16 kg/m. who presents with the following:  He had an acute lightninglike strike of pain on the right side which further developed into numbness and tingling and pain down through the entirety of his posterior leg.  His strength has been preserved.  He does not have any focal weakness that he can define.  This is a new onset issue.  He has had some occasional back pain, but nothing like this at all.  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  He did not have any kind of specific injury that he can recall.  He does work about 25 to 30 hours a week.  He denies saddle anesthesia.  Review of Systems is noted in the HPI, as appropriate   Objective:   BP (!) 160/102   Pulse 90   Temp 98.6 F (37 C) (Temporal)   Ht 5' 8.75" (1.746 m)   Wt 209 lb 8 oz (95 kg)   SpO2 97%   BMI 31.16 kg/m    Range of motion at  the waist: Flexion, extension, lateral bending and rotation: He does fairly limited with flexion to about 80 degrees and minimal ability to extend.  Lateral bending and rotation are  relatively preserved.  No echymosis or edema Rises to examination table with mild difficulty Gait: minimally antalgic  Inspection/Deformity: N Paraspinus Tenderness: L4-S1 bilaterally, worse on the right  B Ankle Dorsiflexion (L5,4): 5/5 B Great Toe Dorsiflexion (L5,4): 5/5 Heel Walk (L5): WNL Toe Walk (S1): WNL Rise/Squat (L4): WNL, mild pain  SENSORY B Medial Foot (L4): WNL B Dorsum (L5): WNL B Lateral (S1): WNL Light Touch: WNL Pinprick: WNL  REFLEXES Knee (L4): 2+ Ankle (S1): 2+  B SLR, seated: neg B SLR, supine: neg B FABER: Pain B Reverse FABER: Pain, predominantly on the right His piriformis on the right is tender to palpation directly B Greater Troch: NT B Log Roll: neg B Sciatic Notch: NT   Radiology: No results found.  Assessment and Plan:     ICD-10-CM   1. Lumbar disc herniation with radiculopathy  M51.16    Lumbar radiculopathy almost certainly secondary to disc herniation.  I reviewed all relevant anatomy with the patient.  He does have tightness in the piriformis, so I gave him some directed rehab for piriformis itself.  Hopefully he will do okay with conservative measures.  Some patients do not and require interventional spine work, and some patients will fail all conservative measures and ultimately require operative intervention.  Steroids, muscle relaxants, tramadol as needed.  Begin basic range  of motion.  Meds ordered this encounter  Medications  . predniSONE (DELTASONE) 20 MG tablet    Sig: 2 tabs po daily for 5 days, then 1 tab po daily for 5 days    Dispense:  15 tablet    Refill:  0  . cyclobenzaprine (FLEXERIL) 10 MG tablet    Sig: Take 1 tablet (10 mg total) by mouth 3 (three) times daily as needed for muscle spasms.    Dispense:  30 tablet    Refill:  0  . traMADol (ULTRAM) 50 MG tablet    Sig: Take 1 tablet (50 mg total) by mouth every 8 (eight) hours as needed for moderate pain.    Dispense:  30 tablet    Refill:  0    Medications Discontinued During This Encounter  Medication Reason  . predniSONE (DELTASONE) 10 MG tablet   . cyclobenzaprine (FLEXERIL) 10 MG tablet Reorder   No orders of the defined types were placed in this encounter.   Follow-up: No follow-ups on file.  Signed,  Maud Deed. Chelcey Caputo, MD   Outpatient Encounter Medications as of 08/22/2020  Medication Sig  . Ascorbic Acid (VITAMIN C) 1000 MG tablet Take 1,000 mg by mouth daily.  . cyclobenzaprine (FLEXERIL) 10 MG tablet Take 1 tablet (10 mg total) by mouth 3 (three) times daily as needed for muscle spasms.  . hydrOXYzine (ATARAX/VISTARIL) 10 MG tablet Take 1 tablet (10 mg total) by mouth daily as needed.  Marland Kitchen levocetirizine (XYZAL) 5 MG tablet Take 1 tablet (5 mg total) by mouth every evening.  . methotrexate 2.5 MG tablet TAKE 10 TABLETS BY MOUTH ONCE WEEKLY  . mometasone (NASONEX) 50 MCG/ACT nasal spray Place 2 sprays into the nose daily.  . sildenafil (VIAGRA) 100 MG tablet Take 1 tablet (100 mg total) by mouth as needed for erectile dysfunction.  . traZODone (DESYREL) 100 MG tablet TAKE 1/2 TO 1 (ONE-HALF TO ONE) TABLET BY MOUTH AT BEDTIME AS NEEDED FOR SLEEP (MUST  SCHEDULE  PHYSICAL  EXAM)  . [DISCONTINUED] cyclobenzaprine (FLEXERIL) 10 MG tablet Take 1 tablet (10 mg total) by mouth 3 (three) times daily as needed for muscle spasms.  . [DISCONTINUED] predniSONE (DELTASONE) 10 MG tablet Take 3 tabs on days 1-3, take 2 tabs on days 4-6, take 1 tab on days 7-9  . predniSONE (DELTASONE) 20 MG tablet 2 tabs po daily for 5 days, then 1 tab po daily for 5 days  . traMADol (ULTRAM) 50 MG tablet Take 1 tablet (50 mg total) by mouth every 8 (eight) hours as needed for moderate pain.   No facility-administered encounter medications on file as of 08/22/2020.

## 2020-09-02 ENCOUNTER — Encounter: Payer: Self-pay | Admitting: Internal Medicine

## 2020-09-02 ENCOUNTER — Ambulatory Visit (INDEPENDENT_AMBULATORY_CARE_PROVIDER_SITE_OTHER): Payer: Medicare HMO | Admitting: Internal Medicine

## 2020-09-02 ENCOUNTER — Telehealth: Payer: Self-pay | Admitting: Internal Medicine

## 2020-09-02 ENCOUNTER — Other Ambulatory Visit: Payer: Self-pay

## 2020-09-02 VITALS — BP 138/88 | HR 78 | Temp 97.5°F | Ht 68.75 in | Wt 207.0 lb

## 2020-09-02 DIAGNOSIS — M5126 Other intervertebral disc displacement, lumbar region: Secondary | ICD-10-CM | POA: Insufficient documentation

## 2020-09-02 MED ORDER — TRAMADOL HCL 50 MG PO TABS: 50.0000 mg | ORAL_TABLET | Freq: Three times a day (TID) | ORAL | 0 refills | Status: DC | PRN

## 2020-09-02 MED ORDER — CYCLOBENZAPRINE HCL 10 MG PO TABS: 10.0000 mg | ORAL_TABLET | Freq: Three times a day (TID) | ORAL | 0 refills | Status: DC | PRN

## 2020-09-02 NOTE — Assessment & Plan Note (Signed)
Diagnosis seems clear Probably right L4 or L5 Discussed MRI imaging in preparation of possible surgical approach--but advised waiting if possible Recommended taking the tramadol with tylenol more regularly Add ibuprofen tid for now MRI if not improving soon

## 2020-09-02 NOTE — Telephone Encounter (Signed)
Pt called back stating his insurance would pay for him to get MRI.  If at hospital pt would have to pay $280  And free standing MRI $175  Pt would prefer free standing MRI.  He stated he would go where ever you recommend

## 2020-09-02 NOTE — Progress Notes (Signed)
Subjective:    Patient ID: Peter Rose, male    DOB: 1955-10-22, 64 y.o.   MRN: [id number]  HPI  Here for ongoing right hip and leg pain This visit occurred during the SARS-CoV-2 public health emergency.  Safety protocols were in place, including screening questions prior to the visit, additional usage of staff PPE, and extensive cleaning of exam room while observing appropriate contact time as indicated for disinfecting solutions.   Now having numbness in his calf and foot Some tingling  Started about 2 weeks ago No specific injury---came on one morning No back pain through all of this Pain starts in right buttock and down side of thigh/calf No leg weakness since the first time he stepped on it the first morning  Prednisone may have helped slightly--but not much Especially painful in the morning----tramadol slight help  Current Outpatient Medications on File Prior to Visit  Medication Sig Dispense Refill  . Ascorbic Acid (VITAMIN C) 1000 MG tablet Take 1,000 mg by mouth daily.    . cyclobenzaprine (FLEXERIL) 10 MG tablet Take 1 tablet (10 mg total) by mouth 3 (three) times daily as needed for muscle spasms. 30 tablet 0  . hydrOXYzine (ATARAX/VISTARIL) 10 MG tablet Take 1 tablet (10 mg total) by mouth daily as needed. 20 tablet 0  . levocetirizine (XYZAL) 5 MG tablet Take 1 tablet (5 mg total) by mouth every evening. 90 tablet 1  . methotrexate 2.5 MG tablet TAKE 10 TABLETS BY MOUTH ONCE WEEKLY 40 tablet 5  . mometasone (NASONEX) 50 MCG/ACT nasal spray Place 2 sprays into the nose daily. 17 g 12  . predniSONE (DELTASONE) 20 MG tablet 2 tabs po daily for 5 days, then 1 tab po daily for 5 days 15 tablet 0  . sildenafil (VIAGRA) 100 MG tablet Take 1 tablet (100 mg total) by mouth as needed for erectile dysfunction. 10 tablet 5  . traMADol (ULTRAM) 50 MG tablet Take 1 tablet (50 mg total) by mouth every 8 (eight) hours as needed for moderate pain. 30 tablet 0  . traZODone (DESYREL)  100 MG tablet TAKE 1/2 TO 1 (ONE-HALF TO ONE) TABLET BY MOUTH AT BEDTIME AS NEEDED FOR SLEEP (MUST  SCHEDULE  PHYSICAL  EXAM) 30 tablet 5   No current facility-administered medications on file prior to visit.    Allergies  Allergen Reactions  . Codeine Other (See Comments)    feels wired and jittery  . Other Other (See Comments)    HYCODAN: Makes pt feel weird/jittery  . Vicodin [Hydrocodone-Acetaminophen] Other (See Comments)    Pt feels "wired" with pain meds    Past Medical History:  Diagnosis Date  . Medical history non-contributory   . Psoriasis     Past Surgical History:  Procedure Laterality Date  . ELBOW SURGERY Right    ligament repar 11/2017  . KNEE ARTHROSCOPY WITH ANTERIOR CRUCIATE LIGAMENT (ACL) REPAIR WITH HAMSTRING GRAFT Right 09/10/2017   Procedure: KNEE ARTHROSCOPY WITH ANTERIOR CRUCIATE LIGAMENT (ACL) REPAIR WITH HAMSTRING GRAFT AND  ALLOGRAFT ,& MEDIAL MENISCECTOMY;  Surgeon: Thornton Park, MD;  Location: [facility] ORS;  Service: Orthopedics;  Laterality: Right;    Family History  Problem Relation Age of Onset  . Headache Neg Hx     Social History   Socioeconomic History  . Marital status: Single    Spouse name: Not on file  . Number of children: 3  . Years of education: 50  . Highest education level: Not on file  Occupational  History  . Occupation: [employer]  Tobacco Use  . Smoking status: Former Smoker    Packs/day: 1.00    Quit date: 01/06/2017    Years since quitting: 3.6  . Smokeless tobacco: Never Used  Vaping Use  . Vaping Use: Never used  Substance and Sexual Activity  . Alcohol use: Yes    Comment: occasional  . Drug use: No  . Sexual activity: Not on file  Other Topics Concern  . Not on file  Social History Narrative   Lives alone   Caffeine use:  2 cups - coffee   Tea daily   Social Determinants of Health   Financial Resource Strain:   . Difficulty of Paying Living Expenses: Not on file  Food Insecurity:   . Worried About  Charity fundraiser in the Last Year: Not on file  . Ran Out of Food in the Last Year: Not on file  Transportation Needs:   . Lack of Transportation (Medical): Not on file  . Lack of Transportation (Non-Medical): Not on file  Physical Activity:   . Days of Exercise per Week: Not on file  . Minutes of Exercise per Session: Not on file  Stress:   . Feeling of Stress : Not on file  Social Connections:   . Frequency of Communication with Friends and Family: Not on file  . Frequency of Social Gatherings with Friends and Family: Not on file  . Attends Religious Services: Not on file  . Active Member of Clubs or Organizations: Not on file  . Attends Archivist Meetings: Not on file  . Marital Status: Not on file  Intimate Partner Violence:   . Fear of Current or Ex-Partner: Not on file  . Emotionally Abused: Not on file  . Physically Abused: Not on file  . Sexually Abused: Not on file   Review of Systems  No saddle anaesthesia No change in bowel or bladder function     Objective:   Physical Exam Constitutional:      Appearance: Normal appearance.  Musculoskeletal:     Comments: No spine tenderness SLR positive on the right but negative on the left Normal ROM in right hip  Neurological:     Mental Status: He is alert.     Comments: Antalgic gait but loosened up after short time No focal leg weakness            Assessment & Plan:

## 2020-09-02 NOTE — Patient Instructions (Signed)
Please try ibuprofen --3 of the 200mg  tabs---- three times a day with food for the next 1-2 weeks. If it bothers your stomach, take omeprazole 20mg  daily on an empty stomach to protect against acid problems. If things aren't improving soon, we will set up an MRI

## 2020-09-05 ENCOUNTER — Telehealth: Payer: Self-pay

## 2020-09-05 NOTE — Telephone Encounter (Signed)
Spoke to pt

## 2020-09-05 NOTE — Telephone Encounter (Signed)
Please let him know that I think he should wait unless he is worsening. If he is worse today, I will go ahead and order the MRI

## 2020-09-05 NOTE — Telephone Encounter (Signed)
[place] Night - Client TELEPHONE ADVICE RECORD [facility] Patient Name: Peter Rose Gender: Male DOB: [birth year] Age: 64 Y 11 M 19 D Return Phone Number: [phone] (Primary) Address: City/State/Zip: [place] [address] Client [name] Night - Client Client Site [name] Physician [name] Silversmith - NP Contact Type Call Who Is Calling Patient / Member / Family / Caregiver Call Type Triage / Clinical Relationship To Patient Self Return Phone Number [phone] (Primary) Chief Complaint Leg Pain Reason for Call Symptomatic / Request for [name] says he was in the office and was told to call and request a refill if it worked for him ---says he has Sciatic pain in right leg Translation No Guidelines Guideline Title Affirmed Question Affirmed Notes Nurse Date/Time Eilene Ghazi Time) Leg Pain [1] MODERATE pain (e.g., interferes with normal activities, limping) AND [2] present > 3 days Terence Lux, RN, Ashok Norris 09/03/2020 2:16:28 PM Disp. Time Eilene Ghazi Time) Disposition Final User 09/03/2020 2:21:40 PM SEE PCP WITHIN 3 DAYS Yes Terence Lux, RN, Ashok Norris Caller Disagree/Comply Comply Caller Understands Yes PreDisposition Call Doctor Care Advice Given Per Guideline SEE PCP WITHIN 3 DAYS: * You need to be seen within 2 or 3 days. CALL BACK IF: * Severe pain occurs * You become worse CARE ADVICE given per Leg Pain (Adult) guideline. Comments User: Bosie Clos, RN Date/Time Eilene Ghazi Time): 09/03/2020 2:23:14 PM Has Rx for a muscle relaxer and pain medication. Needs Rx for Prednisone called to [facility] on [address] in [place]. Was taking a staggered dose. Dr Cornelia Copa told him to call back for refill. Patient to call back if symptoms get worse or increase before Monday. Verbalized understanding.

## 2020-09-05 NOTE — Telephone Encounter (Signed)
Please let him know I put in the order but it is not unusual for it to be weeks before it is done. If he improves before the test, he should cancel it

## 2020-09-05 NOTE — Telephone Encounter (Signed)
Can tell he is off of prednisone. Not worse but nor better. Wants to move forward with MRI.

## 2020-09-05 NOTE — Telephone Encounter (Signed)
Pt called in wanted to let NP Baity that he needs a MRI for his leg due to he has been in the office for the past 3 weeks for the same pain. And was told he needed to go to a free standing imaging center so it can be cheaper.

## 2020-09-05 NOTE — Telephone Encounter (Signed)
Noted, PCP to address tomorrow upon her return.

## 2020-09-06 ENCOUNTER — Other Ambulatory Visit: Payer: Self-pay | Admitting: Family Medicine

## 2020-09-06 NOTE — Telephone Encounter (Signed)
MRI lumbar spine was ordered

## 2020-09-06 NOTE — Telephone Encounter (Signed)
Last office visit 09/02/2020 with Dr. Silvio Pate for lumbar herniated disc.  Not on current medication list. No future appointments with PCP.  Refill?

## 2020-09-06 NOTE — Telephone Encounter (Signed)
I don't refill Prednisone. I would recommend Naproxen 2 tabs in am and pm with food. Will await MRI results.

## 2020-09-07 MED ORDER — PREDNISONE 20 MG PO TABS: ORAL_TABLET | ORAL | 0 refills | Status: DC

## 2020-09-07 NOTE — Addendum Note (Signed)
Addended by: Owens Loffler on: 09/07/2020 04:26 PM   Modules accepted: Orders

## 2020-09-07 NOTE — Telephone Encounter (Signed)
Ongoing significant spine rad, I am comfortable with a refill

## 2020-09-07 NOTE — Telephone Encounter (Signed)
Pt called in stated that Dr. Lorelei Pont would refill the prednisone if he needed it.

## 2020-09-07 NOTE — Telephone Encounter (Signed)
Peter Rose notified by telephone that Dr. Lorelei Pont refilled his prednisone.

## 2020-09-11 ENCOUNTER — Ambulatory Visit
Admission: RE | Admit: 2020-09-11 | Discharge: 2020-09-11 | Disposition: A | Discharge status: Home / Self Care | Payer: Medicare HMO | Source: Ambulatory Visit | Attending: Internal Medicine | Admitting: Internal Medicine

## 2020-09-11 ENCOUNTER — Other Ambulatory Visit: Payer: Self-pay

## 2020-09-11 DIAGNOSIS — M5126 Other intervertebral disc displacement, lumbar region: Secondary | ICD-10-CM

## 2020-09-13 ENCOUNTER — Other Ambulatory Visit: Payer: Self-pay | Admitting: Internal Medicine

## 2020-09-13 DIAGNOSIS — M48062 Spinal stenosis, lumbar region with neurogenic claudication: Secondary | ICD-10-CM

## 2020-09-14 ENCOUNTER — Other Ambulatory Visit: Payer: Self-pay | Admitting: Internal Medicine

## 2020-09-14 NOTE — Telephone Encounter (Signed)
Both last filled 09-02-20 #30 Last OV 09-02-20 No Future OV Referral placed to [facility]

## 2020-09-16 ENCOUNTER — Telehealth: Payer: Self-pay | Admitting: Internal Medicine

## 2020-09-16 NOTE — Telephone Encounter (Signed)
Pt called in wanted to know about getting a temporary handicap sticker.  Please advise

## 2020-09-16 NOTE — Telephone Encounter (Signed)
When is his neurosurgery appt?

## 2020-09-18 ENCOUNTER — Other Ambulatory Visit: Payer: Medicare HMO

## 2020-09-20 NOTE — Telephone Encounter (Signed)
He called this morning and got the phone number to call them

## 2020-09-22 ENCOUNTER — Ambulatory Visit: Payer: Medicare HMO

## 2020-09-23 ENCOUNTER — Other Ambulatory Visit: Payer: Self-pay | Admitting: Neurological Surgery

## 2020-09-29 ENCOUNTER — Other Ambulatory Visit: Payer: Self-pay

## 2020-09-29 ENCOUNTER — Other Ambulatory Visit (HOSPITAL_COMMUNITY)
Admission: RE | Admit: 2020-09-29 | Discharge: 2020-09-29 | Disposition: A | Discharge status: Home / Self Care | Payer: BC Managed Care – PPO | Source: Ambulatory Visit | Attending: Neurological Surgery | Admitting: Neurological Surgery

## 2020-09-29 ENCOUNTER — Encounter (HOSPITAL_COMMUNITY): Payer: Self-pay

## 2020-09-29 ENCOUNTER — Encounter (HOSPITAL_COMMUNITY)
Admission: RE | Admit: 2020-09-29 | Discharge: 2020-09-29 | Disposition: A | Discharge status: Home / Self Care | Payer: BC Managed Care – PPO | Source: Ambulatory Visit | Attending: Neurological Surgery | Admitting: Neurological Surgery

## 2020-09-29 DIAGNOSIS — Z87891 Personal history of nicotine dependence: Secondary | ICD-10-CM | POA: Diagnosis not present

## 2020-09-29 DIAGNOSIS — Z01812 Encounter for preprocedural laboratory examination: Secondary | ICD-10-CM | POA: Insufficient documentation

## 2020-09-29 DIAGNOSIS — Z79899 Other long term (current) drug therapy: Secondary | ICD-10-CM | POA: Diagnosis not present

## 2020-09-29 DIAGNOSIS — M7138 Other bursal cyst, other site: Secondary | ICD-10-CM | POA: Insufficient documentation

## 2020-09-29 DIAGNOSIS — Z8616 Personal history of COVID-19: Secondary | ICD-10-CM | POA: Insufficient documentation

## 2020-09-29 DIAGNOSIS — Z20822 Contact with and (suspected) exposure to covid-19: Secondary | ICD-10-CM | POA: Insufficient documentation

## 2020-09-29 HISTORY — DX: Gastro-esophageal reflux disease without esophagitis: K21.9

## 2020-09-29 HISTORY — DX: Other complications of anesthesia, initial encounter: T88.59XA

## 2020-09-29 HISTORY — DX: COVID-19: U07.1

## 2020-09-29 LAB — CBC
HCT: 41.2 % (ref 39.0–52.0)
Hemoglobin: 14.7 g/dL (ref 13.0–17.0)
MCH: 32.7 pg (ref 26.0–34.0)
MCHC: 35.7 g/dL (ref 30.0–36.0)
MCV: 91.6 fL (ref 80.0–100.0)
Platelets: 227 10*3/uL (ref 150–400)
RBC: 4.5 MIL/uL (ref 4.22–5.81)
RDW: 13 % (ref 11.5–15.5)
WBC: 6.2 10*3/uL (ref 4.0–10.5)
nRBC: 0 % (ref 0.0–0.2)

## 2020-09-29 LAB — SURGICAL PCR SCREEN
MRSA, PCR: NEGATIVE
Staphylococcus aureus: NEGATIVE

## 2020-09-29 LAB — SARS CORONAVIRUS 2 (TAT 6-24 HRS): SARS Coronavirus 2: NEGATIVE

## 2020-09-29 NOTE — Progress Notes (Signed)
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  History includes former smoker, psoriasis, GERD. He had abdominal distention following his 09/10/17 right knee arthroscopy/ACL repair. GI consulted and felt likely secondary to brief ileus or opioid induced constipation, as symptoms improved after laxative and enema. + COVID-19 10/17/19.   BP elevated at PAT. He reported significant back pain and admitted to drinking "a lot" of coffee this morning. He does have a BP cuff at home and was instructed to let his PCP know if DBP are still > 100. He has previous elevated BP readings as outlined below, but also in the setting of sciatica/back pain. He otherwise did not report a history of HTN. Given elevated BP an updated EKG was ordered which appeared stable since 2018. He denied chest pain and SOB. 09/29/20: BP 159/102, 158/110 09/02/20: BP 138/88 08/22/20: BP 160/102 08/16/20: BP 136/90 12/17/19: BP 142/86 (annual physical)  Historically BP has been reasonable, but one reading with DBP 110 at PAT. Pain and caffeine likely contributing, although BP 142/86 mildly elevated at his physical in March. I have communicated with Janett Billow at Dr. Clarice Pole office. Surgery is tomorrow. He will get vitals on arrival. Definitve plan following review and anesthesia team evaluation. 09/29/20 COVID-19 test is in process.   VS: BP (!) 158/110   Pulse 90   Temp 36.9 C   Resp 17   Ht 5\' 10"  (1.778 m)   Wt 92.9 kg   SpO2 98%   BMI 29.39 kg/m  BP 159/102, 158/110. See DISCUSSION.  PROVIDERS: Jearld Fenton, NP is PCP   LABS: Labs reviewed: Acceptable for surgery. Cr 1.08 and glucose 96  on 12/17/19. (all labs ordered are listed, but only abnormal results are displayed)  Labs Reviewed  SURGICAL PCR SCREEN  CBC    IMAGES: MRI L-spine 09/11/20: IMPRESSION: L4-L5 disc bulge, prominent facet degenerative changes and ligamentum flavum redundancy with associated synovial cyst projecting anteriorly from the right facet joint resulting in severe spinal canal stenosis with effacement of the right subarticular zone and impingement on the traversing right L5 nerve root. Findings are new since prior MRI.   EKG: 09/29/20: Normal sinus rhythm Inferior infarct , age undetermined Abnormal ECG - Non-specific T wave abnormality improved, inferior lead appear similar when compared to 09/02/17 tracing.   CV: N/A   Past Medical History:  Diagnosis Date  . Complication of anesthesia    abdominal distention (brief ileus ves opioid induced constipation) following 09/10/17 knee arthroscopy  . COVID   . GERD (gastroesophageal reflux disease)    occasional  . Medical history non-contributory   . Psoriasis     Past Surgical History:  Procedure Laterality Date  . ELBOW SURGERY Right    ligament repar 11/2017  . KNEE ARTHROSCOPY WITH ANTERIOR CRUCIATE LIGAMENT (ACL) REPAIR WITH HAMSTRING GRAFT Right 09/10/2017   Procedure: KNEE ARTHROSCOPY WITH ANTERIOR CRUCIATE LIGAMENT (ACL) REPAIR WITH HAMSTRING GRAFT AND  ALLOGRAFT ,& MEDIAL MENISCECTOMY;  Surgeon: Thornton Park, MD;  Location: [facility] ORS;  Service: Orthopedics;  Laterality: Right;    MEDICATIONS: . acetaminophen (TYLENOL) 500 MG tablet  . Carboxymethylcellul-Glycerin (LUBRICATING EYE DROPS OP)  .  cyclobenzaprine (FLEXERIL) 10 MG tablet  . HYDROcodone-acetaminophen (NORCO/VICODIN) 5-325 MG tablet  . hydrOXYzine (ATARAX/VISTARIL) 10 MG tablet  . ibuprofen (ADVIL) 200 MG tablet  . levocetirizine (XYZAL) 5 MG tablet  . methotrexate 2.5 MG tablet  . mometasone (NASONEX) 50 MCG/ACT nasal spray  . predniSONE (DELTASONE) 20  MG tablet  . sildenafil (VIAGRA) 100 MG tablet  . traMADol (ULTRAM) 50 MG tablet  . traZODone (DESYREL) 100 MG tablet   No current facility-administered medications for this encounter.   Per med list, he is not currently taking hydroxyzine, prednisone, Nasonex, and he takes methotrexate once weekly as needed for psoriasis flare.   Myra Gianotti, PA-C Surgical Short Stay/Anesthesiology [facility] Phone [phone] [facility] Phone [phone] 09/29/2020 12:05 PM

## 2020-09-29 NOTE — Anesthesia Preprocedure Evaluation (Addendum)
Anesthesia Evaluation  Patient identified by MRN, date of birth, ID band Patient awake    Reviewed: Allergy & Precautions, NPO status , Patient's Chart, lab work & pertinent test results, Unable to perform ROS - Chart review only  History of Anesthesia Complications (+) history of anesthetic complications  Airway Mallampati: II  TM Distance: <3 FB     Dental  (+) Dental Advisory Given, Upper Dentures   Pulmonary neg pulmonary ROS, former smoker,    Pulmonary exam normal        Cardiovascular hypertension, Normal cardiovascular exam     Neuro/Psych PSYCHIATRIC DISORDERS Anxiety negative neurological ROS     GI/Hepatic Neg liver ROS, GERD  ,  Endo/Other  negative endocrine ROS  Renal/GU negative Renal ROS     Musculoskeletal negative musculoskeletal ROS (+)   Abdominal   Peds  Hematology negative hematology ROS (+)   Anesthesia Other Findings   Reproductive/Obstetrics                                                          Anesthesia Evaluation  Patient identified by MRN, date of birth, ID band Patient awake    Reviewed: Allergy & Precautions, H&P , NPO status , Patient's Chart, lab work & pertinent test results, reviewed documented beta blocker date and time   Airway Mallampati: II  TM Distance: >3 FB Neck ROM: full    Dental  (+) Teeth Intact   Pulmonary neg pulmonary ROS, former smoker,    Pulmonary exam normal        Cardiovascular Exercise Tolerance: Good negative cardio ROS Normal cardiovascular exam Rhythm:regular Rate:Normal     Neuro/Psych negative neurological ROS  negative psych ROS   GI/Hepatic negative GI ROS, Neg liver ROS,   Endo/Other  negative endocrine ROS  Renal/GU negative Renal ROS  negative genitourinary   Musculoskeletal   Abdominal   Peds  Hematology negative hematology ROS (+)   Anesthesia Other Findings Past Medical  History: No date: Medical history non-contributory No date: Psoriasis Past Surgical History: No date: NO PAST SURGERIES BMI    Body Mass Index:  33.15 kg/m     Reproductive/Obstetrics negative OB ROS                             Anesthesia Physical Anesthesia Plan  ASA: II  Anesthesia Plan: General ETT   Post-op Pain Management:    Induction:   PONV Risk Score and Plan: 3  Airway Management Planned: Oral ETT  Additional Equipment:   Intra-op Plan:   Post-operative Plan:   Informed Consent: I have reviewed the patients History and Physical, chart, labs and discussed the procedure including the risks, benefits and alternatives for the proposed anesthesia with the patient or authorized representative who has indicated his/her understanding and acceptance.   Dental Advisory Given  Plan Discussed with: CRNA  Anesthesia Plan Comments: (Discussed options regarding femoral nerve block and refused by patient. JA)       Anesthesia Quick Evaluation  Anesthesia Physical Anesthesia Plan  ASA: III  Anesthesia Plan: General   Post-op Pain Management:    Induction: Intravenous  PONV Risk Score and Plan: 3 and Ondansetron, Dexamethasone and Treatment may vary due to age or medical condition  Airway Management Planned:  Oral ETT  Additional Equipment: None  Intra-op Plan:   Post-operative Plan: Extubation in OR  Informed Consent: I have reviewed the patients History and Physical, chart, labs and discussed the procedure including the risks, benefits and alternatives for the proposed anesthesia with the patient or authorized representative who has indicated his/her understanding and acceptance.     Dental advisory given  Plan Discussed with: CRNA and Anesthesiologist  Anesthesia Plan Comments: (PAT note written 09/29/2020 by Myra Gianotti, PA-C.  Pt with poorly controlled HTN in PAT yesterday. Apparently went to PCP this AM and was  given Rx for HCTZ due to HTN. Today his BP was 190s/120 on admission to preop and DBP was ~110-125. I discussed with Dr. Ellene Route and felt patient should have BP control prior to OR. Dr. Ellene Route evaluated the patient and felt due to worsened foot drop, the procedure could not be delayed.)     Anesthesia Quick Evaluation

## 2020-09-29 NOTE — Progress Notes (Signed)
PCP - Dr. Silvio Pate  EKG - today, BP on arrival to PAT was 159/102, after the visit it was 158/110. Pt is in pain and he states he drank a lot of coffee this AM. He states he has noticed that when he drinks caffeine it makes is BP go up. Spoke with Ebony Hail, [place] and she requested that we get an EKG. Instructed pt to check his BP at home, if it continues to stay elevated (diastolic 063 or greater) he needs to call his PCP.    COVID TEST- scheduled for today    Anesthesia review: yes  Patient denies shortness of breath, fever, cough and chest pain at PAT appointment   All instructions explained to the patient, with a verbal understanding of the material. Patient agrees to go over the instructions while at home for a better understanding. Patient also instructed to self quarantine after being tested for COVID-19. The opportunity to ask questions was provided.

## 2020-09-29 NOTE — Pre-Procedure Instructions (Signed)
Peter Rose  09/29/2020    Your procedure is scheduled on Friday, September 30, 2020 at 12:35 PM.   Report to [facility] Entrance "A" Admitting Office at 10:35 AM.   Call this number if you have problems the morning of surgery: [phone]   Remember:  Do not eat or drink after midnight tonight.  Take these medicines the morning of surgery with A SIP OF WATER: Hydrocodone (Vicodon) or Tramadol (Ultram) or Acetaminophen (Tylenol) - if needed, Cyclobenzaprine (Flexeril) - if needed, may use eye drops if needed  Do not use NSAIDS (Ibuprofen, Aleve, etc) prior to surgery. Do not use Herbal medications, Aspirin containing products, Multivitamins or Fish Oil prior to surgeyr.    Do not wear jewelry.  Do not wear lotions, powders, cologne or deodorant.  Men may shave face and neck.  Do not bring valuables to the hospital.  [facility] is not responsible for any belongings or valuables.  Contacts, dentures or bridgework may not be worn into surgery.  Leave your suitcase in the car.  After surgery it may be brought to your room.  For patients admitted to the hospital, discharge time will be determined by your treatment team.  Patients discharged the day of surgery will not be allowed to drive home.   [facility] - Preparing for Surgery  Before surgery, you can play an important role.  Because skin is not sterile, your skin needs to be as free of germs as possible.  You can reduce the number of germs on you skin by washing with CHG (chlorahexidine gluconate) soap before surgery.  CHG is an antiseptic cleaner which kills germs and bonds with the skin to continue killing germs even after washing.  Oral Hygiene is also important in reducing the risk of infection.  Remember to brush your teeth with your regular toothpaste the morning of surgery.  Please DO NOT use if you have an allergy to CHG or antibacterial soaps.  If your skin becomes reddened/irritated stop using the CHG and  inform your nurse when you arrive at Short Stay.  Do not shave (including legs and underarms) for at least 48 hours prior to the first CHG shower.  You may shave your face.  Please follow these instructions carefully:   1.  Shower with CHG Soap the night before surgery and the morning of Surgery.  2.  If you choose to wash your hair, wash your hair first as usual with your normal shampoo.  3.  After you shampoo, rinse your hair and body thoroughly to remove the shampoo. 4.  Use CHG as you would any other liquid soap.  You can apply chg directly to the skin and wash gently with a      scrungie or washcloth.           5.  Apply the CHG Soap to your body ONLY FROM THE NECK DOWN.   Do not use on open wounds or open sores. Avoid contact with your eyes, ears, mouth and genitals (private parts).  Wash genitals (private parts) with your normal soap.  6.  Wash thoroughly, paying special attention to the area where your surgery will be performed.  7.  Thoroughly rinse your body with warm water from the neck down.  8.  DO NOT shower/wash with your normal soap after using and rinsing off the CHG Soap.  9.  Pat yourself dry with a clean towel.  10.  Wear clean pajamas.            11.  Place clean sheets on your bed the night of your first shower and do not sleep with pets.  Day of Surgery  Shower as above. Do not apply any lotions/deodorants the morning of surgery.   Please wear clean clothes to the hospital. Remember to brush your teeth with toothpaste.  Please read over the fact sheets that you were given.

## 2020-09-30 ENCOUNTER — Encounter (HOSPITAL_COMMUNITY): Payer: Self-pay | Admitting: Neurological Surgery

## 2020-09-30 ENCOUNTER — Observation Stay (HOSPITAL_COMMUNITY)
Admission: RE | Admit: 2020-09-30 | Discharge: 2020-09-30 | Disposition: A | Discharge status: Home / Self Care | Payer: BC Managed Care – PPO | Attending: Neurological Surgery | Admitting: Neurological Surgery

## 2020-09-30 ENCOUNTER — Telehealth: Payer: Self-pay | Admitting: Internal Medicine

## 2020-09-30 ENCOUNTER — Ambulatory Visit (HOSPITAL_COMMUNITY): Payer: BC Managed Care – PPO | Admitting: Certified Registered"

## 2020-09-30 ENCOUNTER — Encounter (HOSPITAL_COMMUNITY)
Admission: RE | Disposition: A | Discharge status: Home / Self Care | Payer: Self-pay | Source: Home / Self Care | Attending: Neurological Surgery

## 2020-09-30 ENCOUNTER — Encounter: Payer: Self-pay | Admitting: Primary Care

## 2020-09-30 ENCOUNTER — Ambulatory Visit (HOSPITAL_COMMUNITY): Payer: BC Managed Care – PPO

## 2020-09-30 ENCOUNTER — Other Ambulatory Visit: Payer: Self-pay

## 2020-09-30 ENCOUNTER — Ambulatory Visit (INDEPENDENT_AMBULATORY_CARE_PROVIDER_SITE_OTHER): Payer: BC Managed Care – PPO | Admitting: Primary Care

## 2020-09-30 ENCOUNTER — Ambulatory Visit (HOSPITAL_COMMUNITY): Payer: BC Managed Care – PPO | Admitting: Vascular Surgery

## 2020-09-30 VITALS — BP 156/106 | HR 101 | Temp 98.3°F | Ht 70.0 in | Wt 201.8 lb

## 2020-09-30 DIAGNOSIS — I10 Essential (primary) hypertension: Secondary | ICD-10-CM

## 2020-09-30 DIAGNOSIS — F5101 Primary insomnia: Secondary | ICD-10-CM

## 2020-09-30 DIAGNOSIS — M4726 Other spondylosis with radiculopathy, lumbar region: Secondary | ICD-10-CM | POA: Diagnosis not present

## 2020-09-30 DIAGNOSIS — Z87891 Personal history of nicotine dependence: Secondary | ICD-10-CM | POA: Insufficient documentation

## 2020-09-30 DIAGNOSIS — M549 Dorsalgia, unspecified: Secondary | ICD-10-CM | POA: Diagnosis present

## 2020-09-30 DIAGNOSIS — Z419 Encounter for procedure for purposes other than remedying health state, unspecified: Secondary | ICD-10-CM

## 2020-09-30 DIAGNOSIS — M7138 Other bursal cyst, other site: Principal | ICD-10-CM | POA: Insufficient documentation

## 2020-09-30 HISTORY — PX: LUMBAR LAMINECTOMY/DECOMPRESSION MICRODISCECTOMY: SHX5026

## 2020-09-30 SURGERY — LUMBAR LAMINECTOMY/DECOMPRESSION MICRODISCECTOMY 1 LEVEL
Anesthesia: General | Site: Back | Laterality: Right

## 2020-09-30 MED ORDER — FENTANYL CITRATE (PF) 250 MCG/5ML IJ SOLN
INTRAMUSCULAR | Status: AC
  Filled 2020-09-30: qty 5

## 2020-09-30 MED ORDER — TRAZODONE HCL 100 MG PO TABS
ORAL_TABLET | ORAL | 0 refills | Status: DC
Start: 2020-09-30 — End: 2020-12-01

## 2020-09-30 MED ORDER — MEPERIDINE HCL 25 MG/ML IJ SOLN: 6.2500 mg | INTRAMUSCULAR | Status: DC | PRN

## 2020-09-30 MED ORDER — HYDRALAZINE HCL 20 MG/ML IJ SOLN: 10.0000 mg | INTRAMUSCULAR | Status: DC | PRN

## 2020-09-30 MED ORDER — PHENYLEPHRINE HCL-NACL 10-0.9 MG/250ML-% IV SOLN
INTRAVENOUS | Status: DC | PRN
  Administered 2020-09-30: 20 ug/min via INTRAVENOUS

## 2020-09-30 MED ORDER — METHOCARBAMOL 500 MG PO TABS: 500.0000 mg | ORAL_TABLET | Freq: Four times a day (QID) | ORAL | Status: DC | PRN

## 2020-09-30 MED ORDER — LACTATED RINGERS IV SOLN: INTRAVENOUS | Status: DC

## 2020-09-30 MED ORDER — TRAMADOL HCL 50 MG PO TABS: 50.0000 mg | ORAL_TABLET | Freq: Three times a day (TID) | ORAL | Status: DC | PRN

## 2020-09-30 MED ORDER — CEFAZOLIN SODIUM-DEXTROSE 2-4 GM/100ML-% IV SOLN
2.0000 g | INTRAVENOUS | Status: AC
  Administered 2020-09-30: 13:00:00 2 g via INTRAVENOUS

## 2020-09-30 MED ORDER — THROMBIN 5000 UNITS EX SOLR: OROMUCOSAL | Status: DC | PRN

## 2020-09-30 MED ORDER — ORAL CARE MOUTH RINSE: 15.0000 mL | Freq: Once | OROMUCOSAL | Status: AC

## 2020-09-30 MED ORDER — CARBOXYMETHYLCELLUL-GLYCERIN 0.5-0.9 % OP SOLN: 1.0000 [drp] | Freq: Four times a day (QID) | OPHTHALMIC | Status: DC | PRN

## 2020-09-30 MED ORDER — BUPIVACAINE HCL (PF) 0.5 % IJ SOLN
INTRAMUSCULAR | Status: DC | PRN
  Administered 2020-09-30: 5 mL
  Administered 2020-09-30: 25 mL

## 2020-09-30 MED ORDER — OXYCODONE-ACETAMINOPHEN 5-325 MG PO TABS: 1.0000 | ORAL_TABLET | ORAL | Status: DC | PRN

## 2020-09-30 MED ORDER — PHENOL 1.4 % MT LIQD: 1.0000 | OROMUCOSAL | Status: DC | PRN

## 2020-09-30 MED ORDER — PROMETHAZINE HCL 25 MG/ML IJ SOLN
6.2500 mg | INTRAMUSCULAR | Status: DC | PRN
Start: 2020-09-30 — End: 2020-09-30

## 2020-09-30 MED ORDER — PHENYLEPHRINE 40 MCG/ML (10ML) SYRINGE FOR IV PUSH (FOR BLOOD PRESSURE SUPPORT)
PREFILLED_SYRINGE | INTRAVENOUS | Status: AC
  Filled 2020-09-30: qty 10

## 2020-09-30 MED ORDER — CHLORHEXIDINE GLUCONATE CLOTH 2 % EX PADS: 6.0000 | MEDICATED_PAD | Freq: Once | CUTANEOUS | Status: DC

## 2020-09-30 MED ORDER — SODIUM CHLORIDE 0.9 % IV SOLN
250.0000 mL | INTRAVENOUS | Status: DC
  Administered 2020-09-30: 16:00:00 250 mL via INTRAVENOUS

## 2020-09-30 MED ORDER — DEXAMETHASONE SODIUM PHOSPHATE 10 MG/ML IJ SOLN
INTRAMUSCULAR | Status: DC | PRN
  Administered 2020-09-30: 10 mg via INTRAVENOUS

## 2020-09-30 MED ORDER — MORPHINE SULFATE (PF) 2 MG/ML IV SOLN: 2.0000 mg | INTRAVENOUS | Status: DC | PRN

## 2020-09-30 MED ORDER — TRAZODONE HCL 50 MG PO TABS
50.0000 mg | ORAL_TABLET | Freq: Every evening | ORAL | Status: DC | PRN
  Filled 2020-09-30: qty 1

## 2020-09-30 MED ORDER — SUGAMMADEX SODIUM 200 MG/2ML IV SOLN
INTRAVENOUS | Status: DC | PRN
  Administered 2020-09-30: 200 mg via INTRAVENOUS

## 2020-09-30 MED ORDER — HYDROCODONE-ACETAMINOPHEN 5-325 MG PO TABS: 1.0000 | ORAL_TABLET | Freq: Four times a day (QID) | ORAL | Status: DC | PRN

## 2020-09-30 MED ORDER — THROMBIN 5000 UNITS EX SOLR
CUTANEOUS | Status: AC
  Filled 2020-09-30: qty 15000

## 2020-09-30 MED ORDER — ONDANSETRON HCL 4 MG/2ML IJ SOLN
INTRAMUSCULAR | Status: DC | PRN
  Administered 2020-09-30: 4 mg via INTRAVENOUS

## 2020-09-30 MED ORDER — LIDOCAINE-EPINEPHRINE 1 %-1:100000 IJ SOLN
INTRAMUSCULAR | Status: AC
  Filled 2020-09-30: qty 1

## 2020-09-30 MED ORDER — LIDOCAINE 2% (20 MG/ML) 5 ML SYRINGE
INTRAMUSCULAR | Status: DC | PRN
  Administered 2020-09-30: 60 mg via INTRAVENOUS

## 2020-09-30 MED ORDER — DOCUSATE SODIUM 100 MG PO CAPS: 100.0000 mg | ORAL_CAPSULE | Freq: Two times a day (BID) | ORAL | Status: DC

## 2020-09-30 MED ORDER — METHOTREXATE 2.5 MG PO TABS: 25.0000 mg | ORAL_TABLET | ORAL | Status: DC

## 2020-09-30 MED ORDER — CEFAZOLIN SODIUM-DEXTROSE 2-4 GM/100ML-% IV SOLN
INTRAVENOUS | Status: AC
  Filled 2020-09-30: qty 100

## 2020-09-30 MED ORDER — CYCLOBENZAPRINE HCL 10 MG PO TABS: 10.0000 mg | ORAL_TABLET | Freq: Every day | ORAL | Status: DC | PRN

## 2020-09-30 MED ORDER — MIDAZOLAM HCL 2 MG/2ML IJ SOLN
INTRAMUSCULAR | Status: AC
  Filled 2020-09-30: qty 2

## 2020-09-30 MED ORDER — ACETAMINOPHEN 325 MG PO TABS: 650.0000 mg | ORAL_TABLET | ORAL | Status: DC | PRN

## 2020-09-30 MED ORDER — MIDAZOLAM HCL 5 MG/5ML IJ SOLN
INTRAMUSCULAR | Status: DC | PRN
  Administered 2020-09-30: 2 mg via INTRAVENOUS

## 2020-09-30 MED ORDER — FLEET ENEMA 7-19 GM/118ML RE ENEM: 1.0000 | ENEMA | Freq: Once | RECTAL | Status: DC | PRN

## 2020-09-30 MED ORDER — HYDROCHLOROTHIAZIDE 12.5 MG PO CAPS: 12.5000 mg | ORAL_CAPSULE | Freq: Every day | ORAL | Status: DC

## 2020-09-30 MED ORDER — SENNA 8.6 MG PO TABS: 1.0000 | ORAL_TABLET | Freq: Two times a day (BID) | ORAL | Status: DC

## 2020-09-30 MED ORDER — FENTANYL CITRATE (PF) 100 MCG/2ML IJ SOLN
INTRAMUSCULAR | Status: DC | PRN
  Administered 2020-09-30: 100 ug via INTRAVENOUS
  Administered 2020-09-30: 50 ug via INTRAVENOUS

## 2020-09-30 MED ORDER — HYDROCHLOROTHIAZIDE 12.5 MG PO TABS: 12.5000 mg | ORAL_TABLET | Freq: Every day | ORAL | 0 refills | Status: DC

## 2020-09-30 MED ORDER — MENTHOL 3 MG MT LOZG: 1.0000 | LOZENGE | OROMUCOSAL | Status: DC | PRN

## 2020-09-30 MED ORDER — PROPOFOL 10 MG/ML IV BOLUS
INTRAVENOUS | Status: DC | PRN
Start: 2020-09-30 — End: 2020-09-30
  Administered 2020-09-30: 150 mg via INTRAVENOUS

## 2020-09-30 MED ORDER — SODIUM CHLORIDE 0.9% FLUSH: 3.0000 mL | Freq: Two times a day (BID) | INTRAVENOUS | Status: DC

## 2020-09-30 MED ORDER — PROPOFOL 10 MG/ML IV BOLUS
INTRAVENOUS | Status: AC
  Filled 2020-09-30: qty 20

## 2020-09-30 MED ORDER — METHOCARBAMOL 1000 MG/10ML IJ SOLN
500.0000 mg | Freq: Four times a day (QID) | INTRAVENOUS | Status: DC | PRN
  Filled 2020-09-30: qty 5

## 2020-09-30 MED ORDER — BUPIVACAINE HCL (PF) 0.5 % IJ SOLN
INTRAMUSCULAR | Status: AC
  Filled 2020-09-30: qty 30

## 2020-09-30 MED ORDER — KETOROLAC TROMETHAMINE 15 MG/ML IJ SOLN
15.0000 mg | Freq: Four times a day (QID) | INTRAMUSCULAR | Status: DC
  Administered 2020-09-30: 16:00:00 15 mg via INTRAVENOUS
  Filled 2020-09-30: qty 1

## 2020-09-30 MED ORDER — CHLORHEXIDINE GLUCONATE 0.12 % MT SOLN
15.0000 mL | Freq: Once | OROMUCOSAL | Status: AC
  Administered 2020-09-30: 11:00:00 15 mL via OROMUCOSAL
  Filled 2020-09-30: qty 15

## 2020-09-30 MED ORDER — LIDOCAINE-EPINEPHRINE 1 %-1:100000 IJ SOLN
INTRAMUSCULAR | Status: DC | PRN
  Administered 2020-09-30: 5 mL

## 2020-09-30 MED ORDER — ONDANSETRON HCL 4 MG/2ML IJ SOLN: 4.0000 mg | Freq: Four times a day (QID) | INTRAMUSCULAR | Status: DC | PRN

## 2020-09-30 MED ORDER — SODIUM CHLORIDE 0.9% FLUSH: 3.0000 mL | INTRAVENOUS | Status: DC | PRN

## 2020-09-30 MED ORDER — LORATADINE 10 MG PO TABS: 5.0000 mg | ORAL_TABLET | Freq: Every evening | ORAL | Status: DC

## 2020-09-30 MED ORDER — ROCURONIUM BROMIDE 10 MG/ML (PF) SYRINGE
PREFILLED_SYRINGE | INTRAVENOUS | Status: DC | PRN
  Administered 2020-09-30: 60 mg via INTRAVENOUS

## 2020-09-30 MED ORDER — CEFAZOLIN SODIUM-DEXTROSE 2-4 GM/100ML-% IV SOLN: 2.0000 g | Freq: Three times a day (TID) | INTRAVENOUS | Status: DC

## 2020-09-30 MED ORDER — IBUPROFEN 200 MG PO TABS: 600.0000 mg | ORAL_TABLET | Freq: Four times a day (QID) | ORAL | Status: DC | PRN

## 2020-09-30 MED ORDER — BISACODYL 10 MG RE SUPP: 10.0000 mg | Freq: Every day | RECTAL | Status: DC | PRN

## 2020-09-30 MED ORDER — ONDANSETRON HCL 4 MG PO TABS: 4.0000 mg | ORAL_TABLET | Freq: Four times a day (QID) | ORAL | Status: DC | PRN

## 2020-09-30 MED ORDER — 0.9 % SODIUM CHLORIDE (POUR BTL) OPTIME
TOPICAL | Status: DC | PRN
  Administered 2020-09-30: 13:00:00 1000 mL

## 2020-09-30 MED ORDER — POLYETHYLENE GLYCOL 3350 17 G PO PACK: 17.0000 g | PACK | Freq: Every day | ORAL | Status: DC | PRN

## 2020-09-30 MED ORDER — ACETAMINOPHEN 650 MG RE SUPP: 650.0000 mg | RECTAL | Status: DC | PRN

## 2020-09-30 MED ORDER — FENTANYL CITRATE (PF) 100 MCG/2ML IJ SOLN: 25.0000 ug | INTRAMUSCULAR | Status: DC | PRN

## 2020-09-30 SURGICAL SUPPLY — 47 items
BAND RUBBER #18 3X1/16 STRL (MISCELLANEOUS) ×4 IMPLANT
BLADE CLIPPER SURG (BLADE) ×2 IMPLANT
BUR ACORN 6.0 (BURR) IMPLANT
BUR MATCHSTICK NEURO 3.0 LAGG (BURR) ×2 IMPLANT
CANISTER SUCT 3000ML PPV (MISCELLANEOUS) IMPLANT
COVER WAND RF STERILE (DRAPES) ×2 IMPLANT
DECANTER SPIKE VIAL GLASS SM (MISCELLANEOUS) IMPLANT
DERMABOND ADVANCED (GAUZE/BANDAGES/DRESSINGS) ×1
DERMABOND ADVANCED .7 DNX12 (GAUZE/BANDAGES/DRESSINGS) ×1 IMPLANT
DEVICE DISSECT PLASMABLAD 3.0S (MISCELLANEOUS) ×1 IMPLANT
DRAPE HALF SHEET 40X57 (DRAPES) IMPLANT
DRAPE LAPAROTOMY T 102X78X121 (DRAPES) ×2 IMPLANT
DRAPE MICROSCOPE LEICA (MISCELLANEOUS) ×2 IMPLANT
DURAPREP 26ML APPLICATOR (WOUND CARE) ×2 IMPLANT
ELECT REM PT RETURN 9FT ADLT (ELECTROSURGICAL) ×2
ELECTRODE REM PT RTRN 9FT ADLT (ELECTROSURGICAL) ×1 IMPLANT
GAUZE 4X4 16PLY RFD (DISPOSABLE) IMPLANT
GAUZE SPONGE 4X4 12PLY STRL (GAUZE/BANDAGES/DRESSINGS) IMPLANT
GLOVE BIO SURGEON STRL SZ 6.5 (GLOVE) ×2 IMPLANT
GLOVE BIOGEL PI IND STRL 6.5 (GLOVE) IMPLANT
GLOVE BIOGEL PI IND STRL 8.5 (GLOVE) ×1 IMPLANT
GLOVE BIOGEL PI INDICATOR 6.5 (GLOVE)
GLOVE BIOGEL PI INDICATOR 8.5 (GLOVE) ×1
GLOVE ECLIPSE 8.5 STRL (GLOVE) ×2 IMPLANT
GOWN STRL REUS W/ TWL LRG LVL3 (GOWN DISPOSABLE) ×1 IMPLANT
GOWN STRL REUS W/ TWL XL LVL3 (GOWN DISPOSABLE) IMPLANT
GOWN STRL REUS W/TWL 2XL LVL3 (GOWN DISPOSABLE) ×2 IMPLANT
GOWN STRL REUS W/TWL LRG LVL3 (GOWN DISPOSABLE) ×1
GOWN STRL REUS W/TWL XL LVL3 (GOWN DISPOSABLE)
KIT BASIN OR (CUSTOM PROCEDURE TRAY) ×2 IMPLANT
KIT TURNOVER KIT B (KITS) ×2 IMPLANT
NEEDLE HYPO 22GX1.5 SAFETY (NEEDLE) ×2 IMPLANT
NEEDLE SPNL 20GX3.5 QUINCKE YW (NEEDLE) ×2 IMPLANT
NS IRRIG 1000ML POUR BTL (IV SOLUTION) ×2 IMPLANT
PACK LAMINECTOMY NEURO (CUSTOM PROCEDURE TRAY) ×2 IMPLANT
PAD ARMBOARD 7.5X6 YLW CONV (MISCELLANEOUS) IMPLANT
PATTIES SURGICAL .5 X1 (DISPOSABLE) IMPLANT
PLASMABLADE 3.0S (MISCELLANEOUS) ×2
SPONGE SURGIFOAM ABS GEL SZ50 (HEMOSTASIS) IMPLANT
SUT VIC AB 1 CT1 18XBRD ANBCTR (SUTURE) ×1 IMPLANT
SUT VIC AB 1 CT1 8-18 (SUTURE) ×1
SUT VIC AB 2-0 CP2 18 (SUTURE) ×2 IMPLANT
SUT VIC AB 3-0 SH 8-18 (SUTURE) ×2 IMPLANT
SUT VIC AB 4-0 RB1 18 (SUTURE) ×2 IMPLANT
TOWEL GREEN STERILE (TOWEL DISPOSABLE) ×2 IMPLANT
TOWEL GREEN STERILE FF (TOWEL DISPOSABLE) ×2 IMPLANT
WATER STERILE IRR 1000ML POUR (IV SOLUTION) ×2 IMPLANT

## 2020-09-30 NOTE — Op Note (Signed)
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 Postoperative diagnosis: Same Procedure: Right-sided laminotomy foraminotomies and decompression of synovial cyst L4-L5. Surgeon Kristeen Miss Anesthesia General endotracheal indications: Patient is a 64 year old individual who a little over a month ago developed severe pain in the right leg with weakness that has been getting progressively worse.  He has a large synovial cyst on the right side at the L4-L5 level.  Has been advised regarding surgical decompression.  Procedure: Patient was brought to the operating room supine on the stretcher after the smooth induction of general endotracheal anesthesia, he was carefully turned prone.  The back was prepped with alcohol DuraPrep and draped in a sterile fashion.  A midline incision was made in his back and dissection was taken down to L4-L5 which was identified positively with a radiograph.  Laminotomy was then created on the right side removing the inferior margin lamina of L4 at the mesial wall of the facet.  A partial medial facetectomy was performed and as the dissection was carried deep the ligament was noted to be thickened and redundant and dissecting around this identified the contents of a substantial cyst which was a synovial cyst attached to the medial wall of the facet.  This was gradually released and elevated using a careful 2 mm punch around the perimetry then curved Kerrisons were used to remove some overgrown grumous material from this facet cyst in the lateral recess.  The path of the L5 nerve root was well decompressed as was the superior portion of the facet for the path of the L4 nerve root superiorly once this area was decompressed hemostasis was achieved and the area was copiously irrigated.  25 cc of half percent Marcaine was then injected into the paraspinous region.  With this the lumbodorsal fascia was closed  with #1 Vicryl in interrupted fashion 2-0 Vicryl was used in the subcutaneous tissues and 3-0 Vicryl subcuticularly.  Dermabond was placed on the skin blood loss was estimated at less than 25 cc.

## 2020-09-30 NOTE — Telephone Encounter (Signed)
Called [facility] to se if they would call and get Rx from [facility]. They will fill this for patient. Called pt to make him aware no answer LM informing patient.

## 2020-09-30 NOTE — Telephone Encounter (Signed)
Pt called in to say to change his prescription for the high BP to go back to [place] [facility] [address]

## 2020-09-30 NOTE — H&P (Signed)
CHIEF COMPLAINT: Back and right leg pain for over a month.  HISTORY OF PRESENT ILLNESS: Peter Rose is a 64 year old, right-handed individual who tells me that fairly abruptly a little over a month ago he woke up starting to have some right buttock and leg pain.  Over the next days it got progressively worse and he was seen by his primary care doctor, was given some prednisone.  This had little impact on the pain and ultimately, he underwent an MRI of the lumbar spine performed on November 28.  This study reviewed in the office demonstrates that overall the patient has a very healthy-looking back.  However, he has evolved a significant synovial cyst on the right side at L4-L5 that compresses the right side of the thecal sac and both the passage of the L5 and the S1 nerve roots.  Clinically the patient notes that the bulk of the pain radiates down to his calf and his leg and foot, and in the office he is clearly very uncomfortable, sitting with his right leg outstretched.  He notes that the pain has not relented even though there are certain times and certain positions that he can get some relief.  He has been troubled with the pain and it has impacted his ability to sleep and rest and do activities during the daytime.  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  He does not feel that the leg is weak at all at this time, but he is seeking some definitive help for this process.  PAST MEDICAL HISTORY: Reveals that his general health has been excellent.  He reports no significant medical issues whatsoever.  He is a former smoker and has been nonsmoking for a number of years now.  PHYSICAL EXAMINATION: I note that he stands with a slight forward stoop.  He has a marked amount of paravertebral spasm in the lumbar spine when he walks.  He bears weight on that right leg reluctantly and on testing his motor  strength, his tibialis anterior on the right side is weak to 4/5 compared to the left side.  Gastroc strength appears intact, although the Achilles reflex is absent on the right side compared to the left side.  Patellar reflexes are symmetric.  His straight leg raising is markedly positive on that right side at 10 degrees.  Patrick maneuver appears negative.  ASSESSMENT: At this point, I discussed the findings on the patient's MRI.  I note that he has overall a very healthy-looking back save for the formation of a synovial cyst on that right side at L4-L5.  This does suggest and indicates that there is some arthritic degeneration of the facet joint that created the cyst and I explained to him that ultimately I believe he would be best served with decompression of the cyst, particularly in light of the fact that he is experiencing the weakness that is present.  We discussed other alternatives to conservative care, being that he has been on the oral prednisone without much relief.  We could consider an epidural steroid injection, but my feeling is that given the size of this cyst, it is not likely to yield much response.  I have advised the most direct intervention would be to do a laminotomy, foraminotomy, and decompress the cyst on that right side at L4-L5.  This would be done on an outpatient basis.  The patient would  require about a 4 to 6-week time to heal.  He asked about his activity level during that time, and I noted that the mantra is to sit straight, walk straight, and stand straight, and avoid any bending, lifting, stooping, and vigorous use of the back, even though he may feel better.   The patient presents for surgery today however he is noted to be significantly hypertensive.  He notes that his pain has been difficult to control and he had not been using any significant pain medications.  He was seen by his primary care doctor today who started him on hydrochlorothiazide.  Is noted that a little  over a month ago he was not particularly hypertensive with a diastolic of 88 but his blood pressure today reads a diastolic of 697.  He is in significant discomfort and his exam demonstrates that he has further weakness in the tibialis anterior today than he had last week.  After some discussion with the patient noting the risks for the potential of stroke with uncontrolled hypertension I believe at this point will be best to proceed with decompression of the synovial cyst and hopefully relieve his pain to a substantial degree thereafter he can continue his blood pressure control medications as needed but I also suspect that he will see some relief of his hypertension with good pain control from surgical decompression.Marland Kitchen

## 2020-09-30 NOTE — Progress Notes (Signed)
Spoke to Dr. Lissa Hoard regarding high blood pressure readings.  He will come to patient's bedside.

## 2020-09-30 NOTE — Plan of Care (Signed)
Patient refused teaching, Patient wants to go home without waiting for MD. Patient left AMA.

## 2020-09-30 NOTE — Assessment & Plan Note (Signed)
Fluctuations in blood pressure for years, above goal today, also yesterday. Do agree that pain is contributing, but he does have a long history of elevated readings plus family history.  Agree to treat. Rx for HCTZ 12.5 mg sent to pharmacy. Discussed to monitor BP at home for next 2 weeks.  He will set up a follow up visit with his PCP for 2 weeks for BP check and BMP.

## 2020-09-30 NOTE — Evaluation (Signed)
Physical Therapy Evaluation Patient Details Name: Peter Rose MRN: [id number] DOB: Jan 23, 1956 Today's Date: 09/30/2020   History of Present Illness  Pt with synovial cyst at rt side of L4-L5. Underwent rt laminectomy and decompression of cyst L4-L5. PMH - rt knee ACL reconstruction, rt elbow surgery  Clinical Impression  Pt doing well with mobility and no further PT needed.  Ready for dc from PT standpoint.      Follow Up Recommendations No PT follow up    Equipment Recommendations  None recommended by PT    Recommendations for Other Services       Precautions / Restrictions Precautions Precautions: Back Precaution Booklet Issued: Yes (comment)      Mobility  Bed Mobility Overal bed mobility: Modified Independent             General bed mobility comments: verbal cues for technique to follow back precautions    Transfers Overall transfer level: Independent Equipment used: None                Ambulation/Gait Ambulation/Gait assistance: Independent Gait Distance (Feet): 300 Feet Assistive device: None Gait Pattern/deviations: WFL(Within Functional Limits) Gait velocity: normal Gait velocity interpretation: >4.37 ft/sec, indicative of normal walking speed General Gait Details: steady gait  Stairs Stairs: Yes Stairs assistance: Modified independent (Device/Increase time) Stair Management: One rail Left;Alternating pattern;Forwards Number of Stairs: 5 General stair comments: No difficulty  Wheelchair Mobility    Modified Rankin (Stroke Patients Only)       Balance Overall balance assessment: Independent                                           Pertinent Vitals/Pain Pain Assessment: 0-10 Pain Score: 4  Pain Location: rt buttock Pain Descriptors / Indicators: Aching Pain Intervention(s): Monitored during session    Home Living Family/patient expects to be discharged to:: Private residence Living Arrangements:  Alone   Type of Home: Apartment Home Access: Level entry     Home Layout: One level Home Equipment: Cane - single point;Crutches      Prior Function Level of Independence: Independent         Comments: works part time     Journalist, newspaper   Dominant Hand: Right    Extremity/Trunk Assessment   Upper Extremity Assessment Upper Extremity Assessment: Defer to OT evaluation    Lower Extremity Assessment Lower Extremity Assessment: Overall WFL for tasks assessed       Communication   Communication: No difficulties  Cognition Arousal/Alertness: Awake/alert Behavior During Therapy: WFL for tasks assessed/performed Overall Cognitive Status: Within Functional Limits for tasks assessed                                        General Comments      Exercises     Assessment/Plan    PT Assessment Patent does not need any further PT services  PT Problem List         PT Treatment Interventions      PT Goals (Current goals can be found in the Care Plan section)  Acute Rehab PT Goals PT Goal Formulation: All assessment and education complete, DC therapy    Frequency     Barriers to discharge        Co-evaluation  AM-PAC PT "6 Clicks" Mobility  Outcome Measure Help needed turning from your back to your side while in a flat bed without using bedrails?: None Help needed moving from lying on your back to sitting on the side of a flat bed without using bedrails?: None Help needed moving to and from a bed to a chair (including a wheelchair)?: None Help needed standing up from a chair using your arms (e.g., wheelchair or bedside chair)?: None Help needed to walk in hospital room?: None Help needed climbing 3-5 steps with a railing? : None 6 Click Score: 24    End of Session   Activity Tolerance: Patient tolerated treatment well Patient left: in bed;with call bell/phone within reach   PT Visit Diagnosis: Other abnormalities of  gait and mobility (R26.89)    Time: 2446-2863 PT Time Calculation (min) (ACUTE ONLY): 14 min   Charges:   PT Evaluation $PT Eval Low Complexity: [place] Pager [phone] Office [place] 09/30/2020, 4:50 PM

## 2020-09-30 NOTE — Anesthesia Procedure Notes (Signed)
Procedure Name: Intubation Date/Time: 09/30/2020 1:08 PM Performed by: Lavell Luster, CRNA Pre-anesthesia Checklist: Patient identified, Emergency Drugs available, Suction available, Patient being monitored and Timeout performed Patient Re-evaluated:Patient Re-evaluated prior to induction Oxygen Delivery Method: Circle system utilized Preoxygenation: Pre-oxygenation with 100% oxygen Induction Type: IV induction Ventilation: Mask ventilation without difficulty Laryngoscope Size: Mac and 4 Grade View: Grade II Tube type: Oral Tube size: 7.5 mm Number of attempts: 1 Airway Equipment and Method: Stylet Placement Confirmation: ETT inserted through vocal cords under direct vision,  positive ETCO2 and breath sounds checked- equal and bilateral Secured at: 21 cm Tube secured with: Tape Dental Injury: Teeth and Oropharynx as per pre-operative assessment

## 2020-09-30 NOTE — Evaluation (Signed)
Occupational Therapy Evaluation and Discharge Patient Details Name: Peter Rose MRN: [id number] DOB: June 29, 1956 Today's Date: 09/30/2020    History of Present Illness Pt with synovial cyst at rt side of L4-L5. Underwent rt laminectomy and decompression of cyst L4-L5. PMH - rt knee ACL reconstruction, rt elbow surgery   Clinical Impression   This 64 yo male admitted with above presents to acute OT with all education completed. Gave pt the opportunity to practice any and all of the education I verbally went over with him and he he said he understood and did not feel the need to practice. We will D/C from acute OT.    Follow Up Recommendations  No OT follow up    Equipment Recommendations  None recommended by OT       Precautions / Restrictions Precautions Precautions: Back Precaution Booklet Issued: Yes (comment) Precaution Comments: went over handout that PT had already given him Restrictions Weight Bearing Restrictions: No      Mobility Bed Mobility Overal bed mobility: Modified Independent             General bed mobility comments: verbal cues for technique to follow back precautions    Transfers Overall transfer level: Independent Equipment used: None                  Balance Overall balance assessment: Independent                                         ADL either performed or assessed with clinical judgement   ADL                                         General ADL Comments: Educated on following back precautions with basic ADLs, bringing legs up to him to don/doff socks/shoes--donning underwear and pants before socks. Using wet wipes for back peri care for ease of cleaning, using two cups for brushing teeth to avoid bending over the sink, turning feet out for sit<.>stand which allows him to keep a straight back     Vision Patient Visual Report: No change from baseline              Pertinent Vitals/Pain  Pain Assessment: No/denies pain Pain Score: 4  Pain Location: rt buttock Pain Descriptors / Indicators: Aching Pain Intervention(s): Monitored during session     Hand Dominance Right   Extremity/Trunk Assessment Upper Extremity Assessment Upper Extremity Assessment: Overall WFL for tasks assessed   Lower Extremity Assessment Lower Extremity Assessment: Overall WFL for tasks assessed       Communication Communication Communication: No difficulties   Cognition Arousal/Alertness: Awake/alert Behavior During Therapy: WFL for tasks assessed/performed Overall Cognitive Status: Within Functional Limits for tasks assessed                                                Home Living Family/patient expects to be discharged to:: Private residence Living Arrangements: Non-relatives/Friends (girlfriend) Available Help at Discharge: Friend(s);Available 24 hours/day Type of Home: Apartment Home Access: Level entry     Home Layout: One level     Bathroom Shower/Tub: Tub/shower unit;Curtain   Biochemist, clinical: Standard Bathroom  Accessibility: Yes   Home Equipment: Cane - single point;Crutches          Prior Functioning/Environment Level of Independence: Independent        Comments: works part time                 Advertising account planner goals can be found in the care plan section) Acute Rehab OT Goals Patient Stated Goal: to go home this evening  OT Frequency:                AM-PAC OT "6 Clicks" Daily Activity     Outcome Measure Help from another person eating meals?: None Help from another person taking care of personal grooming?: None Help from another person toileting, which includes using toliet, bedpan, or urinal?: None Help from another person bathing (including washing, rinsing, drying)?: None Help from another person to put on and taking off regular upper body clothing?: None Help from another person to put on and taking off regular lower  body clothing?: None 6 Click Score: 24   End of Session Nurse Communication:  (no further OT needs)  Activity Tolerance: Patient tolerated treatment well Patient left: in bed  OT Visit Diagnosis: Muscle weakness (generalized) (M62.81)                Time: 1594-5859 OT Time Calculation (min): 13 min Charges:  OT General Charges $OT Visit: 1 Visit OT Evaluation $OT Eval Low Complexity: 1 Low  [place], OTR/L Acute [employer] Pager [phone] Office [phone]     Almon Register 09/30/2020, 5:43 PM

## 2020-09-30 NOTE — Transfer of Care (Signed)
Immediate Anesthesia Transfer of Care Note  Patient: Peter Rose  Procedure(s) Performed: Right Lumbar four-five Laminectomy for facet/synovial cyst (Right Back)  Patient Location: PACU  Anesthesia Type:General  Level of Consciousness: awake, alert  and sedated  Airway & Oxygen Therapy: Patient connected to face mask oxygen  Post-op Assessment: Post -op Vital signs reviewed and stable  Post vital signs: stable  Last Vitals:  Vitals Value Taken Time  BP 150/82 09/30/20 1422  Temp    Pulse 79 09/30/20 1423  Resp 19 09/30/20 1423  SpO2 99 % 09/30/20 1423  Vitals shown include unvalidated device data.  Last Pain:  Vitals:   09/30/20 1107  TempSrc:   PainSc: 7          Complications: No complications documented.

## 2020-09-30 NOTE — Patient Instructions (Signed)
Start hydrochlorothiazide 12.5 mg once daily for blood pressure.  Start monitoring your blood pressure daily, around the same time of day, for the next 2-3 weeks.  Ensure that you have rested for 30 minutes prior to checking your blood pressure. Record your readings and bring them to your next visit.  Schedule a visit with [facility] for blood pressure check for 2 weeks.  It was a pleasure meeting you!   Hypertension, Adult Hypertension is another name for high blood pressure. High blood pressure forces your heart to work harder to pump blood. This can cause problems over time. There are two numbers in a blood pressure reading. There is a top number (systolic) over a bottom number (diastolic). It is best to have a blood pressure that is below 120/80. Healthy choices can help lower your blood pressure, or you may need medicine to help lower it. What are the causes? The cause of this condition is not known. Some conditions may be related to high blood pressure. What increases the risk?  Smoking.  Having type 2 diabetes mellitus, high cholesterol, or both.  Not getting enough exercise or physical activity.  Being overweight.  Having too much fat, sugar, calories, or salt (sodium) in your diet.  Drinking too much alcohol.  Having long-term (chronic) kidney disease.  Having a family history of high blood pressure.  Age. Risk increases with age.  Race. You may be at higher risk if you are African American.  Gender. Men are at higher risk than women before age 15. After age 87, women are at higher risk than men.  Having obstructive sleep apnea.  Stress. What are the signs or symptoms?  High blood pressure may not cause symptoms. Very high blood pressure (hypertensive crisis) may cause: ? Headache. ? Feelings of worry or nervousness (anxiety). ? Shortness of breath. ? Nosebleed. ? A feeling of being sick to your stomach (nausea). ? Throwing up (vomiting). ? Changes in how you  see. ? Very bad chest pain. ? Seizures. How is this treated?  This condition is treated by making healthy lifestyle changes, such as: ? Eating healthy foods. ? Exercising more. ? Drinking less alcohol.  Your health care provider may prescribe medicine if lifestyle changes are not enough to get your blood pressure under control, and if: ? Your top number is above 130. ? Your bottom number is above 80.  Your personal target blood pressure may vary. Follow these instructions at home: Eating and drinking   If told, follow the DASH eating plan. To follow this plan: ? Fill one half of your plate at each meal with fruits and vegetables. ? Fill one fourth of your plate at each meal with whole grains. Whole grains include whole-wheat pasta, brown rice, and whole-grain bread. ? Eat or drink low-fat dairy products, such as skim milk or low-fat yogurt. ? Fill one fourth of your plate at each meal with low-fat (lean) proteins. Low-fat proteins include fish, chicken without skin, eggs, beans, and tofu. ? Avoid fatty meat, cured and processed meat, or chicken with skin. ? Avoid pre-made or processed food.  Eat less than 1,500 mg of salt each day.  Do not drink alcohol if: ? Your doctor tells you not to drink. ? You are pregnant, may be pregnant, or are planning to become pregnant.  If you drink alcohol: ? Limit how much you use to:  0-1 drink a day for women.  0-2 drinks a day for men. ? Be aware of how  much alcohol is in your drink. In the [place] one drink equals one 12 oz bottle of beer (355 mL), one 5 oz glass of wine (148 mL), or one 1 oz glass of hard liquor (44 mL). Lifestyle   Work with your doctor to stay at a healthy weight or to lose weight. Ask your doctor what the best weight is for you.  Get at least 30 minutes of exercise most days of the week. This may include walking, swimming, or biking.  Get at least 30 minutes of exercise that strengthens your muscles (resistance  exercise) at least 3 days a week. This may include lifting weights or doing Pilates.  Do not use any products that contain nicotine or tobacco, such as cigarettes, e-cigarettes, and chewing tobacco. If you need help quitting, ask your doctor.  Check your blood pressure at home as told by your doctor.  Keep all follow-up visits as told by your doctor. This is important. Medicines  Take over-the-counter and prescription medicines only as told by your doctor. Follow directions carefully.  Do not skip doses of blood pressure medicine. The medicine does not work as well if you skip doses. Skipping doses also puts you at risk for problems.  Ask your doctor about side effects or reactions to medicines that you should watch for. Contact a doctor if you:  Think you are having a reaction to the medicine you are taking.  Have headaches that keep coming back (recurring).  Feel dizzy.  Have swelling in your ankles.  Have trouble with your vision. Get help right away if you:  Get a very bad headache.  Start to feel mixed up (confused).  Feel weak or numb.  Feel faint.  Have very bad pain in your: ? Chest. ? Belly (abdomen).  Throw up more than once.  Have trouble breathing. Summary  Hypertension is another name for high blood pressure.  High blood pressure forces your heart to work harder to pump blood.  For most people, a normal blood pressure is less than 120/80.  Making healthy choices can help lower blood pressure. If your blood pressure does not get lower with healthy choices, you may need to take medicine. This information is not intended to replace advice given to you by your health care provider. Make sure you discuss any questions you have with your health care provider. Document Revised: 06/11/2018 Document Reviewed: 06/11/2018 Elsevier Patient Education  2020 [employer].

## 2020-09-30 NOTE — Progress Notes (Signed)
Patient alert and oriented, ambulating independently, voiding adequate amount of urine without difficulty.RN offered patient pain medication but patient stated he has no pain or discomfort at this time. Patient awaiting MD for discharged home. Patient was noted to be anxious. Patient keep calling staff and coming to the nursing station asking for surgeon but Dr. In OR assisting with surgery. "Patient stated I am leaving and I am not waiting for him". Patient signed AMA papers and left.

## 2020-09-30 NOTE — Assessment & Plan Note (Signed)
Refilled 30 day supply of Trazodone until he can see PCP in 2 weeks.

## 2020-09-30 NOTE — Progress Notes (Signed)
Subjective:    Patient ID: Peter Rose, male    DOB: 04/05/1956, 64 y.o.   MRN: [id number]  HPI  This visit occurred during the SARS-CoV-2 public health emergency.  Safety protocols were in place, including screening questions prior to the visit, additional usage of staff PPE, and extensive cleaning of exam room while observing appropriate contact time as indicated for disinfecting solutions.   Peter Rose is a 64 year old male patient of [name] Silversmith with a history of hypertension, insomnia, anxiety, erectile dysfunction, lumbar herniated disc who presents today with a chief complaint of hypertension. He is also requesting a refill of his Trazodone for sleep.  He is scheduled for surgery today at 12 pm to under go right lumbar 4-5 laminectomy, was told that his blood pressure was too high. He can still undergo surgery as long as his diastolic blood pressure is "below 110".  He denies a prior diagnosis of hypertension, and has never been medicated. He does have a strong family history of hypertension without heart attack/stroke.   He's currently in lot of pain with his lower back which has been bothersome for about one month. He endorses a long history of fluctuations in blood pressure, was trying to work on lifestyle changes which have been ineffective.   He denies dizziness, headaches, visual changes, chest pain.    BP Readings from Last 3 Encounters:  09/30/20 (!) 156/106  09/29/20 (!) 158/110  09/02/20 138/88     Review of Systems  Eyes: Negative for visual disturbance.  Respiratory: Negative for shortness of breath.   Cardiovascular: Negative for chest pain.  Musculoskeletal: Positive for back pain.  Neurological: Negative for dizziness and headaches.       Past Medical History:  Diagnosis Date  . Complication of anesthesia    abdominal distention (brief ileus ves opioid induced constipation) following 09/10/17 knee arthroscopy  . COVID   . GERD (gastroesophageal  reflux disease)    occasional  . Medical history non-contributory   . Psoriasis      Social History   Socioeconomic History  . Marital status: Single    Spouse name: Not on file  . Number of children: 3  . Years of education: 44  . Highest education level: Not on file  Occupational History  . Occupation: [employer]  Tobacco Use  . Smoking status: Former Smoker    Packs/day: 1.00    Quit date: 01/06/2017    Years since quitting: 3.7  . Smokeless tobacco: Never Used  Vaping Use  . Vaping Use: Never used  Substance and Sexual Activity  . Alcohol use: Yes    Comment: occasional  . Drug use: No  . Sexual activity: Not on file  Other Topics Concern  . Not on file  Social History Narrative   Lives alone   Caffeine use:  2 cups - coffee   Tea daily   Social Determinants of Health   Financial Resource Strain: Not on file  Food Insecurity: Not on file  Transportation Needs: Not on file  Physical Activity: Not on file  Stress: Not on file  Social Connections: Not on file  Intimate Partner Violence: Not on file    Past Surgical History:  Procedure Laterality Date  . ELBOW SURGERY Right    ligament repar 11/2017  . KNEE ARTHROSCOPY WITH ANTERIOR CRUCIATE LIGAMENT (ACL) REPAIR WITH HAMSTRING GRAFT Right 09/10/2017   Procedure: KNEE ARTHROSCOPY WITH ANTERIOR CRUCIATE LIGAMENT (ACL) REPAIR WITH HAMSTRING GRAFT AND  ALLOGRAFT ,& MEDIAL MENISCECTOMY;  Surgeon: Thornton Park, MD;  Location: [facility] ORS;  Service: Orthopedics;  Laterality: Right;    Family History  Problem Relation Age of Onset  . Headache Neg Hx     Allergies  Allergen Reactions  . Codeine Other (See Comments)    feels wired and jittery  . Hycodan [Hydrocodone-Homatropine]     Makes pt feel weird/jittery  . Vicodin [Hydrocodone-Acetaminophen] Other (See Comments)    Pt feels "wired" with pain meds    Current Outpatient Medications on File Prior to Visit  Medication Sig Dispense Refill  .  Carboxymethylcellul-Glycerin (LUBRICATING EYE DROPS OP) Place 1 drop into both eyes daily as needed (dry eyes).    . cyclobenzaprine (FLEXERIL) 10 MG tablet Take 1 tablet by mouth three times daily as needed for muscle spasm (Patient taking differently: Take 10 mg by mouth daily as needed for muscle spasms.) 30 tablet 0  . HYDROcodone-acetaminophen (NORCO/VICODIN) 5-325 MG tablet Take 1 tablet by mouth every 6 (six) hours as needed for moderate pain.    Marland Kitchen ibuprofen (ADVIL) 200 MG tablet Take 600-800 mg by mouth every 6 (six) hours as needed for moderate pain.    Marland Kitchen levocetirizine (XYZAL) 5 MG tablet Take 1 tablet (5 mg total) by mouth every evening. (Patient taking differently: Take 5 mg by mouth at bedtime as needed for allergies.) 90 tablet 1  . methotrexate 2.5 MG tablet TAKE 10 TABLETS BY MOUTH ONCE WEEKLY (Patient taking differently: Take 25 mg by mouth See admin instructions. Take 25 mg once a week when needed for psoriasis flare) 40 tablet 5  . mometasone (NASONEX) 50 MCG/ACT nasal spray Place 2 sprays into the nose daily. 17 g 12  . sildenafil (VIAGRA) 100 MG tablet Take 1 tablet (100 mg total) by mouth as needed for erectile dysfunction. 10 tablet 5  . traMADol (ULTRAM) 50 MG tablet TAKE 1 TABLET BY MOUTH EVERY 8 HOURS AS NEEDED FOR  MODERATE  PAIN (Patient taking differently: Take 50 mg by mouth every 8 (eight) hours as needed for moderate pain.) 30 tablet 0  . traZODone (DESYREL) 100 MG tablet TAKE 1/2 TO 1 (ONE-HALF TO ONE) TABLET BY MOUTH AT BEDTIME AS NEEDED FOR SLEEP (MUST  SCHEDULE  PHYSICAL  EXAM) (Patient taking differently: Take 100 mg by mouth at bedtime as needed for sleep.) 30 tablet 5  . acetaminophen (TYLENOL) 500 MG tablet Take 2,000-2,500 mg by mouth every 8 (eight) hours as needed for moderate pain. (Patient not taking: Reported on 09/30/2020)    . hydrOXYzine (ATARAX/VISTARIL) 10 MG tablet Take 1 tablet (10 mg total) by mouth daily as needed. (Patient not taking: No sig  reported) 20 tablet 0  . predniSONE (DELTASONE) 20 MG tablet 2 tabs po daily for 5 days, then 1 tab po daily for 5 days (Patient not taking: No sig reported) 15 tablet 0   No current facility-administered medications on file prior to visit.    BP (!) 156/106   Pulse (!) 101   Temp 98.3 F (36.8 C) (Temporal)   Ht 5\' 10"  (1.778 m)   Wt 201 lb 12.8 oz (91.5 kg)   SpO2 95%   BMI 28.96 kg/m    Objective:   Physical Exam Constitutional:      Appearance: He is well-nourished.  Cardiovascular:     Rate and Rhythm: Normal rate and regular rhythm.  Pulmonary:     Effort: Pulmonary effort is normal.     Breath sounds: Normal breath  sounds.  Musculoskeletal:     Cervical back: Neck supple.     Comments: Appears uncomfortable sitting in exam room.  Skin:    General: Skin is warm and dry.  Psychiatric:        Mood and Affect: Mood and affect normal.            Assessment & Plan:

## 2020-10-03 ENCOUNTER — Encounter (HOSPITAL_COMMUNITY): Payer: Self-pay | Admitting: Neurological Surgery

## 2020-10-03 NOTE — Anesthesia Postprocedure Evaluation (Signed)
Anesthesia Post Note  Patient: Peter Rose  Procedure(s) Performed: Right Lumbar four-five Laminectomy for facet/synovial cyst (Right Back)     Patient location during evaluation: PACU Anesthesia Type: General Level of consciousness: awake and alert Pain management: pain level controlled Vital Signs Assessment: post-procedure vital signs reviewed and stable Respiratory status: spontaneous breathing, nonlabored ventilation and respiratory function stable Cardiovascular status: blood pressure returned to baseline and stable Postop Assessment: no apparent nausea or vomiting Anesthetic complications: no   No complications documented.  Last Vitals:  Vitals:   09/30/20 1507 09/30/20 1541  BP: (!) 135/97 (!) 145/108  Pulse: 73 66  Resp: 14 20  Temp: 36.8 C 36.8 C  SpO2: 98% 100%    Last Pain:  Vitals:   09/30/20 1507  TempSrc:   PainSc: 0-No pain                 Audry Pili

## 2020-10-12 ENCOUNTER — Encounter: Payer: Self-pay | Admitting: Urology

## 2020-10-12 ENCOUNTER — Ambulatory Visit: Payer: Medicare HMO | Admitting: Urology

## 2020-10-20 ENCOUNTER — Encounter: Payer: Self-pay | Admitting: Internal Medicine

## 2020-10-20 ENCOUNTER — Other Ambulatory Visit: Payer: Self-pay

## 2020-10-20 ENCOUNTER — Telehealth: Payer: Self-pay | Admitting: Internal Medicine

## 2020-10-20 ENCOUNTER — Ambulatory Visit (INDEPENDENT_AMBULATORY_CARE_PROVIDER_SITE_OTHER): Payer: Medicare HMO | Admitting: Internal Medicine

## 2020-10-20 DIAGNOSIS — I10 Essential (primary) hypertension: Secondary | ICD-10-CM | POA: Diagnosis not present

## 2020-10-20 DIAGNOSIS — F5101 Primary insomnia: Secondary | ICD-10-CM

## 2020-10-20 MED ORDER — MOMETASONE FUROATE 50 MCG/ACT NA SUSP
2.0000 | Freq: Every day | NASAL | 2 refills | Status: DC
Start: 2020-10-20 — End: 2021-04-22

## 2020-10-20 MED ORDER — LISINOPRIL-HYDROCHLOROTHIAZIDE 10-12.5 MG PO TABS
1.0000 | ORAL_TABLET | Freq: Every day | ORAL | 0 refills | Status: DC
Start: 2020-10-20 — End: 2020-10-28

## 2020-10-20 MED ORDER — MOMETASONE FUROATE 50 MCG/ACT NA SUSP: 2.0000 | Freq: Every day | NASAL | 2 refills | Status: DC

## 2020-10-20 MED ORDER — LEVOCETIRIZINE DIHYDROCHLORIDE 5 MG PO TABS: 5.0000 mg | ORAL_TABLET | Freq: Every evening | ORAL | 3 refills | Status: DC | PRN

## 2020-10-20 MED ORDER — METHOTREXATE SODIUM 2.5 MG PO TABS: 25.0000 mg | ORAL_TABLET | ORAL | 2 refills | Status: DC

## 2020-10-20 MED ORDER — ZOLPIDEM TARTRATE 5 MG PO TABS: 5.0000 mg | ORAL_TABLET | Freq: Every evening | ORAL | 0 refills | Status: DC | PRN

## 2020-10-20 MED ORDER — SILDENAFIL CITRATE 100 MG PO TABS: 100.0000 mg | ORAL_TABLET | ORAL | 5 refills | Status: DC | PRN

## 2020-10-20 NOTE — Telephone Encounter (Signed)
Patient states she had an appt with you today and was supposed to have some medicine for his sinuses. He went to the pharmacy and they did not have an order. Please advise patient EM

## 2020-10-20 NOTE — Assessment & Plan Note (Addendum)
Improved but not at goal DC HCTZ Rx for Lisinopril HCTZ 10-12.5 1 tab p.o. daily Reinforced DASH diet and exercise weight loss  RTC in 1 weeks for nurse visit BP check and lab only appointment for [facility]

## 2020-10-20 NOTE — Patient Instructions (Signed)

## 2020-10-20 NOTE — Progress Notes (Signed)
Subjective:    Patient ID: Peter Rose, male    DOB: February 06, 1956, 65 y.o.   MRN: [id number]  HPI  Patient presents to the clinic today for 2-week follow-up of hypertension.  At his last visit he was started on HCTZ.  He has been taking the medication as prescribed.  He denies adverse side effects.  His BP today is 144/86.  ECG from 09/2020 reviewed.  He would like refills of all his other medications as well.  Review of Systems      Past Medical History:  Diagnosis Date  . Complication of anesthesia    abdominal distention (brief ileus ves opioid induced constipation) following 09/10/17 knee arthroscopy  . COVID   . GERD (gastroesophageal reflux disease)    occasional  . Medical history non-contributory   . Psoriasis     Current Outpatient Medications  Medication Sig Dispense Refill  . Carboxymethylcellul-Glycerin (LUBRICATING EYE DROPS OP) Place 1 drop into both eyes daily as needed (dry eyes).    . cyclobenzaprine (FLEXERIL) 10 MG tablet Take 1 tablet by mouth three times daily as needed for muscle spasm (Patient taking differently: Take 10 mg by mouth daily as needed for muscle spasms.) 30 tablet 0  . hydrochlorothiazide (HYDRODIURIL) 12.5 MG tablet Take 1 tablet (12.5 mg total) by mouth daily. For blood pressure. 30 tablet 0  . HYDROcodone-acetaminophen (NORCO/VICODIN) 5-325 MG tablet Take 1 tablet by mouth every 6 (six) hours as needed for moderate pain.    Marland Kitchen ibuprofen (ADVIL) 200 MG tablet Take 600-800 mg by mouth every 6 (six) hours as needed for moderate pain.    Marland Kitchen levocetirizine (XYZAL) 5 MG tablet Take 1 tablet (5 mg total) by mouth every evening. (Patient taking differently: Take 5 mg by mouth at bedtime as needed for allergies.) 90 tablet 1  . methotrexate 2.5 MG tablet TAKE 10 TABLETS BY MOUTH ONCE WEEKLY (Patient taking differently: Take 25 mg by mouth See admin instructions. Take 25 mg once a week when needed for psoriasis flare) 40 tablet 5  . mometasone  (NASONEX) 50 MCG/ACT nasal spray Place 2 sprays into the nose daily. 17 g 12  . sildenafil (VIAGRA) 100 MG tablet Take 1 tablet (100 mg total) by mouth as needed for erectile dysfunction. 10 tablet 5  . traMADol (ULTRAM) 50 MG tablet TAKE 1 TABLET BY MOUTH EVERY 8 HOURS AS NEEDED FOR  MODERATE  PAIN (Patient taking differently: Take 50 mg by mouth every 8 (eight) hours as needed for moderate pain.) 30 tablet 0  . traZODone (DESYREL) 100 MG tablet TAKE 1/2 TO 1 (ONE-HALF TO ONE) TABLET BY MOUTH AT BEDTIME AS NEEDED FOR SLEEP (MUST  SCHEDULE  PHYSICAL  EXAM) 30 tablet 0   No current facility-administered medications for this visit.    Allergies  Allergen Reactions  . Codeine Other (See Comments)    feels wired and jittery  . Hycodan [Hydrocodone-Homatropine]     Makes pt feel weird/jittery  . Vicodin [Hydrocodone-Acetaminophen] Other (See Comments)    Pt feels "wired" with pain meds    Family History  Problem Relation Age of Onset  . Headache Neg Hx     Social History   Socioeconomic History  . Marital status: Single    Spouse name: Not on file  . Number of children: 3  . Years of education: 56  . Highest education level: Not on file  Occupational History  . Occupation: [employer]  Tobacco Use  . Smoking status: Former  Smoker    Packs/day: 1.00    Quit date: 01/06/2017    Years since quitting: 3.7  . Smokeless tobacco: Never Used  Vaping Use  . Vaping Use: Never used  Substance and Sexual Activity  . Alcohol use: Yes    Comment: occasional  . Drug use: No  . Sexual activity: Not on file  Other Topics Concern  . Not on file  Social History Narrative   Lives alone   Caffeine use:  2 cups - coffee   Tea daily   Social Determinants of Health   Financial Resource Strain: Not on file  Food Insecurity: Not on file  Transportation Needs: Not on file  Physical Activity: Not on file  Stress: Not on file  Social Connections: Not on file  Intimate Partner Violence: Not on  file     Constitutional: Denies fever, malaise, fatigue, headache or abrupt weight changes.  Respiratory: Denies difficulty breathing, shortness of breath, cough or sputum production.   Cardiovascular: Denies chest pain, chest tightness, palpitations or swelling in the hands or feet.  Neurological: Denies dizziness, difficulty with memory, difficulty with speech or problems with balance and coordination.    No other specific complaints in a complete review of systems (except as listed in HPI above).  Objective:   Physical Exam  BP (!) 144/86   Pulse 86   Temp 98.2 F (36.8 C) (Temporal)   Wt 205 lb (93 kg)   SpO2 98%   BMI 29.41 kg/m   Wt Readings from Last 3 Encounters:  09/30/20 201 lb (91.2 kg)  09/30/20 201 lb 12.8 oz (91.5 kg)  09/29/20 204 lb 12.8 oz (92.9 kg)    General: Appears his stated age, obese, in NAD. Cardiovascular: Normal rate and rhythm. S1,S2 noted.  No murmur, rubs or gallops noted. No JVD or BLE edema.  Pulmonary/Chest: Normal effort and positive vesicular breath sounds. No respiratory distress. No wheezes, rales or ronchi noted.  Neurological: Alert and oriented.   BMET    Component Value Date/Time   NA 140 12/17/2019 1124   K 4.7 12/17/2019 1124   CL 104 12/17/2019 1124   CO2 29 12/17/2019 1124   GLUCOSE 96 12/17/2019 1124   BUN 13 12/17/2019 1124   CREATININE 1.08 12/17/2019 1124   CALCIUM 9.8 12/17/2019 1124   GFRNONAA 59 (L) 09/12/2017 0355   GFRAA >60 09/12/2017 0355    Lipid Panel     Component Value Date/Time   CHOL 156 12/17/2019 1124   TRIG 97.0 12/17/2019 1124   HDL 42.30 12/17/2019 1124   CHOLHDL 4 12/17/2019 1124   VLDL 19.4 12/17/2019 1124   LDLCALC 94 12/17/2019 1124    CBC    Component Value Date/Time   WBC 6.2 09/29/2020 1042   RBC 4.50 09/29/2020 1042   HGB 14.7 09/29/2020 1042   HCT 41.2 09/29/2020 1042   PLT 227 09/29/2020 1042   MCV 91.6 09/29/2020 1042   MCH 32.7 09/29/2020 1042   MCHC 35.7 09/29/2020  1042   RDW 13.0 09/29/2020 1042   LYMPHSABS 1.7 09/10/2017 0612   MONOABS 0.6 09/10/2017 0612   EOSABS 0.2 09/10/2017 0612   BASOSABS 0.1 09/10/2017 0612    Hgb A1C Lab Results  Component Value Date   HGBA1C 5.4 12/17/2019            Assessment & Plan:   [name] Silversmith, NP This visit occurred during the SARS-CoV-2 public health emergency.  Safety protocols were in place, including screening questions  prior to the visit, additional usage of staff PPE, and extensive cleaning of exam room while observing appropriate contact time as indicated for disinfecting solutions.

## 2020-10-21 ENCOUNTER — Ambulatory Visit: Payer: Medicare HMO | Admitting: Internal Medicine

## 2020-10-21 NOTE — Telephone Encounter (Signed)
PA has been submitted to [employer] through [email or web address]... awaiting response

## 2020-10-22 ENCOUNTER — Other Ambulatory Visit: Payer: Self-pay | Admitting: Internal Medicine

## 2020-10-22 DIAGNOSIS — I10 Essential (primary) hypertension: Secondary | ICD-10-CM

## 2020-10-25 ENCOUNTER — Telehealth: Payer: Self-pay

## 2020-10-25 NOTE — Telephone Encounter (Signed)
Called and spoke with patient who stated that after he started taking his new blood pressure medication, his blood pressure was 80/50. Patient denied being lightheaded or dizzy at this moment. Patient stated that he stopped taking the blood pressure medication on Saturday and has not taken any since then. Patient stated that yesterday, his blood pressure was 150/130. Patient denies being symptomatic. Patient has a office visit on 10/28/2020 and states that he is not going to take any medication until he sees [name] Silversmith, NP on Friday. Encouraged patient to check B/P, but he was at work an unable to do so. Encouraged patient to keep a log of his B/P and HR and bring to his appointment. UC and ED precautions given to patient and explained importance of maintaining a healthy blood pressure. Patient verbalized understanding.

## 2020-10-25 NOTE — Telephone Encounter (Signed)
I spoke to pt and he states the pill is so small, there is no way he can half the pill... please advise

## 2020-10-25 NOTE — Telephone Encounter (Signed)
[facility] - Client TELEPHONE ADVICE RECORD [facility] Patient Name: Peter Rose Gender: Male DOB: 1956/09/19 Age: 65 Y 1 M 10 D Return Phone Number: [phone] (Primary) Address: City/State/Zip: [place] [address] Client  [facility] - Client Client Site [place] - Day Physician [name] Silversmith - NP Contact Type Call Who Is Calling Patient / Member / Family / Caregiver Call Type Triage / Clinical Relationship To Patient Self Return Phone Number [phone] (Primary) Chief Complaint BLOOD PRESSURE LOW - Systolic (top number) 90 or less with dizzy or weak symptoms Reason for Call Symptomatic / Request for Health Information Initial Comment Caller states blood pressure is 80/50 after taking medication. Translation No Nurse Assessment Nurse: Raenette Rover, RN, Zella Ball Date/Time (Eastern Time): 10/25/2020 1:05:49 PM Confirm and document reason for call. If symptomatic, describe symptoms. ---Recent bp changed and it was as low as 80/50 after the change. Hasn't taken anymore since this occurred. Not sure what it is today because he is at work and unable to check it. Does the patient have any new or worsening symptoms? ---Yes Will a triage be completed? ---Yes Related visit to physician within the last 2 weeks? ---Yes Does the PT have any chronic conditions? (i.e. diabetes, asthma, this includes High risk factors for pregnancy, etc.) ---Yes List chronic conditions. ---htn Is this a behavioral health or substance abuse call? ---No Guidelines Guideline Title Affirmed Question Affirmed Notes Nurse Date/Time (Eastern Time) Blood Pressure - High [2] Systolic BP >= 694 OR Diastolic >= 80 AND [8] pregnant Raenette Rover, RN, Zella Ball 10/25/2020 1:09:09 PM Disp. Time Eilene Ghazi Time) Disposition Final User 10/25/2020 1:04:30 PM Send to Urgent Ezra Sites 10/25/2020 1:11:12 PM SEE PCP WITHIN 3 DAYS Yes Raenette Rover, RN,  Herbert Deaner Disagree/Comply Comply PLEASE NOTE: All timestamps contained within this report are represented as [place] Standard Time. CONFIDENTIALTY NOTICE: This fax transmission is intended only for the addressee. It contains information that is legally privileged, confidential or otherwise protected from use or disclosure. If you are not the intended recipient, you are strictly prohibited from reviewing, disclosing, copying using or disseminating any of this information or taking any action in reliance on or regarding this information. If you have received this fax in error, please notify us immediately by telephone so that we can arrange for its return to [place]. Phone: [phone], Toll-Free: [phone], Fax: [phone] Page: 2 of 2 Call Id: [id number] Doyle Understands Yes PreDisposition Did not know what to do Care Advice Given Per Guideline * Blurred vision or face swelling occurs * Chest pain or difficulty breathing occurs * Difficulty walking, difficulty talking, or severe headache occurs * Weakness or numbness of the face, arm or leg on one side of the body occurs CALL BACK IF: * You become worse CARE ADVICE given per High Blood Pressure (Adult) guideline. SEE PCP WITHIN 3 DAYS: Comments User: Wilson Singer, RN Date/Time Eilene Ghazi Time): 10/25/2020 1:09:05 PM yesterday 157/137 User: Wilson Singer, RN Date/Time Eilene Ghazi Time): 10/25/2020 1:10:13 PM has appt friday at 2 pm User: Wilson Singer, RN Date/Time (Eastern Time): 10/25/2020 1:13:33 PM Caller states he stopped his medication the day after he took it because it was too low and isnt going to take it anymore until he sees her of Friday. Last bp yesterday was" 150/138 or something like that." Currently not sure of the name of the bp medication he is on.

## 2020-10-25 NOTE — Telephone Encounter (Signed)
Would he be willing to restart at 1/2 tab daily until he sees me Friday?

## 2020-10-26 NOTE — Telephone Encounter (Signed)
Would he be will to d/c lisinopril HCTZ. Restart his previous dose of HCTZ and I can send in Lisinopril 5 mg daily?

## 2020-10-28 ENCOUNTER — Other Ambulatory Visit: Payer: Self-pay

## 2020-10-28 ENCOUNTER — Ambulatory Visit (INDEPENDENT_AMBULATORY_CARE_PROVIDER_SITE_OTHER): Payer: Medicare HMO | Admitting: Internal Medicine

## 2020-10-28 ENCOUNTER — Other Ambulatory Visit: Payer: Medicare HMO

## 2020-10-28 ENCOUNTER — Encounter: Payer: Self-pay | Admitting: Internal Medicine

## 2020-10-28 ENCOUNTER — Other Ambulatory Visit (INDEPENDENT_AMBULATORY_CARE_PROVIDER_SITE_OTHER): Payer: BC Managed Care – PPO

## 2020-10-28 DIAGNOSIS — I10 Essential (primary) hypertension: Secondary | ICD-10-CM

## 2020-10-28 LAB — BASIC METABOLIC PANEL
BUN: 12 mg/dL (ref 6–23)
CO2: 28 mEq/L (ref 19–32)
Calcium: 9.5 mg/dL (ref 8.4–10.5)
Chloride: 103 mEq/L (ref 96–112)
Creatinine, Ser: 1.08 mg/dL (ref 0.40–1.50)
GFR: 72.21 mL/min (ref >60.00)
Glucose, Bld: 82 mg/dL (ref 70–99)
Potassium: 3.8 mEq/L (ref 3.5–5.1)
Sodium: 137 mEq/L (ref 135–145)

## 2020-10-28 NOTE — Assessment & Plan Note (Signed)
Will continue Lisinopril HCT at current dose (1/2 tab daily) BMET today Reinforced DASH diet and exercise for weight loss  RTC in 3 months for your AWV

## 2020-10-28 NOTE — Patient Instructions (Signed)

## 2020-10-28 NOTE — Progress Notes (Signed)
Subjective:    Patient ID: Peter Rose, male    DOB: [birth year], 65 y.o.   MRN: [id number]  HPI  Patient presents the clinic today for follow-up of hypertension.  At his last visit his HCTZ was changed to lisinopril HCTZ secondary to continued elevated blood pressures.  He started taking the medication as prescribed, but reports hypotensive episode with a BP of 80/50.  He reports he was not symptomatic at this time with lightheadedness, dizziness, visual changes, weakness or syncope.  He has been taking a 1/2 tablet daily, reports his BP at home has been 125/70, 110/70. His BP today is 138/88.  Review of Systems      Past Medical History:  Diagnosis Date  . Complication of anesthesia    abdominal distention (brief ileus ves opioid induced constipation) following 09/10/17 knee arthroscopy  . COVID   . GERD (gastroesophageal reflux disease)    occasional  . Medical history non-contributory   . Psoriasis     Current Outpatient Medications  Medication Sig Dispense Refill  . Carboxymethylcellul-Glycerin (LUBRICATING EYE DROPS OP) Place 1 drop into both eyes daily as needed (dry eyes).    Marland Kitchen ibuprofen (ADVIL) 200 MG tablet Take 600-800 mg by mouth every 6 (six) hours as needed for moderate pain.    Marland Kitchen levocetirizine (XYZAL) 5 MG tablet Take 1 tablet (5 mg total) by mouth at bedtime as needed for allergies. 90 tablet 3  . lisinopril-hydrochlorothiazide (ZESTORETIC) 10-12.5 MG tablet Take 1 tablet by mouth daily. 30 tablet 0  . methotrexate 2.5 MG tablet Take 10 tablets (25 mg total) by mouth See admin instructions. Take 25 mg once a week when needed for psoriasis flare 30 tablet 2  . mometasone (NASONEX) 50 MCG/ACT nasal spray Place 2 sprays into the nose daily. 17 g 2  . sildenafil (VIAGRA) 100 MG tablet Take 1 tablet (100 mg total) by mouth as needed for erectile dysfunction. 10 tablet 5  . traZODone (DESYREL) 100 MG tablet TAKE 1/2 TO 1 (ONE-HALF TO ONE) TABLET BY MOUTH AT BEDTIME AS  NEEDED FOR SLEEP (MUST  SCHEDULE  PHYSICAL  EXAM) 30 tablet 0  . zolpidem (AMBIEN) 5 MG tablet Take 1 tablet (5 mg total) by mouth at bedtime as needed for sleep. 30 tablet 0   No current facility-administered medications for this visit.    Allergies  Allergen Reactions  . Codeine Other (See Comments)    feels wired and jittery  . Hycodan [Hydrocodone-Homatropine]     Makes pt feel weird/jittery  . Vicodin [Hydrocodone-Acetaminophen] Other (See Comments)    Pt feels "wired" with pain meds    Family History  Problem Relation Age of Onset  . Headache Neg Hx     Social History   Socioeconomic History  . Marital status: Single    Spouse name: Not on file  . Number of children: 3  . Years of education: 27  . Highest education level: Not on file  Occupational History  . Occupation: [employer]  Tobacco Use  . Smoking status: Former Smoker    Packs/day: 1.00    Quit date: 01/06/2017    Years since quitting: 3.8  . Smokeless tobacco: Never Used  Vaping Use  . Vaping Use: Never used  Substance and Sexual Activity  . Alcohol use: Yes    Comment: occasional  . Drug use: No  . Sexual activity: Not on file  Other Topics Concern  . Not on file  Social History Narrative  Lives alone   Caffeine use:  2 cups - coffee   Tea daily   Social Determinants of Health   Financial Resource Strain: Not on file  Food Insecurity: Not on file  Transportation Needs: Not on file  Physical Activity: Not on file  Stress: Not on file  Social Connections: Not on file  Intimate Partner Violence: Not on file     Constitutional: Denies fever, malaise, fatigue, headache or abrupt weight changes.  Respiratory: Denies difficulty breathing, shortness of breath, cough or sputum production.   Cardiovascular: Denies chest pain, chest tightness, palpitations or swelling in the hands or feet.  Neurological: Denies dizziness, difficulty with memory, difficulty with speech or problems with balance and  coordination.    No other specific complaints in a complete review of systems (except as listed in HPI above).  Objective:   Physical Exam  BP 132/80   Pulse 67   Temp (!) 97.5 F (36.4 C) (Temporal)   Wt 208 lb (94.3 kg)   SpO2 97%   BMI 29.84 kg/m   Wt Readings from Last 3 Encounters:  10/20/20 205 lb (93 kg)  09/30/20 201 lb (91.2 kg)  09/30/20 201 lb 12.8 oz (91.5 kg)    General: Appears his stated age, obese, in NAD. Cardiovascular: Normal rate and rhythm. S1,S2 noted.  No murmur, rubs or gallops noted. No JVD or BLE edema.  Pulmonary/Chest: Normal effort and positive vesicular breath sounds. No respiratory distress. No wheezes, rales or ronchi noted.  Neurological: Alert and oriented.  BMET    Component Value Date/Time   NA 140 12/17/2019 1124   K 4.7 12/17/2019 1124   CL 104 12/17/2019 1124   CO2 29 12/17/2019 1124   GLUCOSE 96 12/17/2019 1124   BUN 13 12/17/2019 1124   CREATININE 1.08 12/17/2019 1124   CALCIUM 9.8 12/17/2019 1124   GFRNONAA 59 (L) 09/12/2017 0355   GFRAA >60 09/12/2017 0355    Lipid Panel     Component Value Date/Time   CHOL 156 12/17/2019 1124   TRIG 97.0 12/17/2019 1124   HDL 42.30 12/17/2019 1124   CHOLHDL 4 12/17/2019 1124   VLDL 19.4 12/17/2019 1124   LDLCALC 94 12/17/2019 1124    CBC    Component Value Date/Time   WBC 6.2 09/29/2020 1042   RBC 4.50 09/29/2020 1042   HGB 14.7 09/29/2020 1042   HCT 41.2 09/29/2020 1042   PLT 227 09/29/2020 1042   MCV 91.6 09/29/2020 1042   MCH 32.7 09/29/2020 1042   MCHC 35.7 09/29/2020 1042   RDW 13.0 09/29/2020 1042   LYMPHSABS 1.7 09/10/2017 0612   MONOABS 0.6 09/10/2017 0612   EOSABS 0.2 09/10/2017 0612   BASOSABS 0.1 09/10/2017 0612    Hgb A1C Lab Results  Component Value Date   HGBA1C 5.4 12/17/2019            Assessment & Plan:    [name] Silversmith, NP This visit occurred during the SARS-CoV-2 public health emergency.  Safety protocols were in place, including  screening questions prior to the visit, additional usage of staff PPE, and extensive cleaning of exam room while observing appropriate contact time as indicated for disinfecting solutions.

## 2020-11-21 ENCOUNTER — Other Ambulatory Visit: Payer: Self-pay | Admitting: Internal Medicine

## 2020-11-23 NOTE — Telephone Encounter (Signed)
Last filled 10/20/2020... please advise

## 2020-11-29 ENCOUNTER — Other Ambulatory Visit: Payer: Self-pay | Admitting: Primary Care

## 2020-11-29 DIAGNOSIS — F5101 Primary insomnia: Secondary | ICD-10-CM

## 2020-11-30 NOTE — Telephone Encounter (Signed)
To PCP, filled in your absence last time.

## 2020-12-06 ENCOUNTER — Other Ambulatory Visit: Payer: Self-pay | Admitting: Internal Medicine

## 2020-12-09 ENCOUNTER — Other Ambulatory Visit: Payer: Self-pay | Admitting: Internal Medicine

## 2020-12-26 ENCOUNTER — Telehealth: Payer: Self-pay

## 2020-12-26 NOTE — Telephone Encounter (Signed)
[facility] Night - Client Nonclinical Telephone Record  [facility] Client [facility] Night - Client Client Site [place] - Night Physician [facility] - PHYSICIAN, Verita Schneiders- MD Contact Type Call Who Is Calling Patient / Member / Family / Caregiver Caller Name Oran Dillenburg Caller Phone Number [phone] Patient Name Peter Rose Patient DOB Apr 27, 1956 Call Type Message Only Information Provided Reason for Call Request to Schedule Office Appointment Initial Comment Caller states needs to be seen. Wants to set up an appointment for next week. Additional Comment Call back request call back with an availability. Disp. Time Disposition Final User 12/23/2020 5:04:25 PM General Information Provided Yes Margaretmary Bayley Call Closed By: Margaretmary Bayley Transaction Date/Time: 12/23/2020 5:01:25 PM (ET)

## 2020-12-26 NOTE — Telephone Encounter (Signed)
LVM for patient to return call to office.

## 2020-12-26 NOTE — Telephone Encounter (Signed)
Spoke with patient who stated that he no longer needs an appointment. Patient stated that for the past couple of weeks he has been experiencing diarrhea. He started eliminating things from his diet that might be causing it and realized that it was salads. Patient reported that he eats a salad every night and since he stopped eating them he has not had any episodes of diarrhea. Patient stated the he feels fine and is not having any other symptoms. Encouraged patient to call back if he experienced this again. Patient verbalized understanding.

## 2020-12-26 NOTE — Telephone Encounter (Signed)
Noted  

## 2020-12-27 ENCOUNTER — Other Ambulatory Visit: Payer: Self-pay | Admitting: Internal Medicine

## 2021-01-12 ENCOUNTER — Telehealth: Payer: Self-pay | Admitting: Internal Medicine

## 2021-01-12 DIAGNOSIS — F5101 Primary insomnia: Secondary | ICD-10-CM

## 2021-01-12 NOTE — Telephone Encounter (Signed)
Peter Rose called and stated that he is needing ambien, methotrexate, xyzal and trazodone, and sent them [facility] [address] [phone]

## 2021-01-13 ENCOUNTER — Other Ambulatory Visit: Payer: Self-pay

## 2021-01-13 ENCOUNTER — Ambulatory Visit (INDEPENDENT_AMBULATORY_CARE_PROVIDER_SITE_OTHER): Payer: Medicare HMO | Admitting: Internal Medicine

## 2021-01-13 ENCOUNTER — Encounter: Payer: Self-pay | Admitting: Internal Medicine

## 2021-01-13 VITALS — BP 136/82 | HR 83 | Temp 97.1°F | Wt 212.0 lb

## 2021-01-13 DIAGNOSIS — L989 Disorder of the skin and subcutaneous tissue, unspecified: Secondary | ICD-10-CM | POA: Diagnosis not present

## 2021-01-13 MED ORDER — METHOTREXATE SODIUM 2.5 MG PO TABS: 25.0000 mg | ORAL_TABLET | ORAL | 2 refills | Status: DC

## 2021-01-13 MED ORDER — TRAZODONE HCL 100 MG PO TABS: 100.0000 mg | ORAL_TABLET | Freq: Every day | ORAL | 0 refills | Status: DC

## 2021-01-13 MED ORDER — LISINOPRIL-HYDROCHLOROTHIAZIDE 10-12.5 MG PO TABS: 1.0000 | ORAL_TABLET | Freq: Every day | ORAL | 0 refills | Status: DC

## 2021-01-13 NOTE — Progress Notes (Signed)
Subjective:    Patient ID: Peter Rose, male    DOB: 1956-08-12, 65 y.o.   MRN: [id number]  HPI  Pt presents to the clinic today with c/o a dark spot on his nose. He reports his hair dresser noticed this about 1 month ago. He is not sure if it was there prior to that. It does not itchy, burn, or drain. He has not tried anything OTC for this.  Review of Systems      Past Medical History:  Diagnosis Date  . Complication of anesthesia    abdominal distention (brief ileus ves opioid induced constipation) following 09/10/17 knee arthroscopy  . COVID   . GERD (gastroesophageal reflux disease)    occasional  . Medical history non-contributory   . Psoriasis     Current Outpatient Medications  Medication Sig Dispense Refill  . Carboxymethylcellul-Glycerin (LUBRICATING EYE DROPS OP) Place 1 drop into both eyes daily as needed (dry eyes).    Marland Kitchen ibuprofen (ADVIL) 200 MG tablet Take 600-800 mg by mouth every 6 (six) hours as needed for moderate pain.    Marland Kitchen levocetirizine (XYZAL) 5 MG tablet Take 1 tablet (5 mg total) by mouth at bedtime as needed for allergies. 90 tablet 3  . lisinopril-hydrochlorothiazide (ZESTORETIC) 10-12.5 MG tablet Take 1 tablet by mouth once daily 90 tablet 0  . methotrexate 2.5 MG tablet Take 10 tablets (25 mg total) by mouth See admin instructions. Take 25 mg once a week when needed for psoriasis flare 30 tablet 2  . mometasone (NASONEX) 50 MCG/ACT nasal spray Place 2 sprays into the nose daily. 17 g 2  . sildenafil (VIAGRA) 100 MG tablet Take 1 tablet (100 mg total) by mouth as needed for erectile dysfunction. 10 tablet 5  . traZODone (DESYREL) 100 MG tablet TAKE 1/2 TO 1 (ONE-HALF TO ONE) TABLET BY MOUTH AT BEDTIME AS NEEDED FOR SLEEP (MUST  SCHEDULE  PHYSICAL  EXAM) 30 tablet 0  . zolpidem (AMBIEN) 5 MG tablet TAKE 1 TABLET BY MOUTH AT BEDTIME AS NEEDED FOR SLEEP 30 tablet 0   No current facility-administered medications for this visit.    Allergies  Allergen  Reactions  . Codeine Other (See Comments)    feels wired and jittery  . Hycodan [Hydrocodone-Homatropine]     Makes pt feel weird/jittery  . Vicodin [Hydrocodone-Acetaminophen] Other (See Comments)    Pt feels "wired" with pain meds    Family History  Problem Relation Age of Onset  . Headache Neg Hx     Social History   Socioeconomic History  . Marital status: Single    Spouse name: Not on file  . Number of children: 3  . Years of education: 58  . Highest education level: Not on file  Occupational History  . Occupation: [employer]  Tobacco Use  . Smoking status: Former Smoker    Packs/day: 1.00    Quit date: 01/06/2017    Years since quitting: 4.0  . Smokeless tobacco: Never Used  Vaping Use  . Vaping Use: Never used  Substance and Sexual Activity  . Alcohol use: Yes    Comment: occasional  . Drug use: No  . Sexual activity: Not on file  Other Topics Concern  . Not on file  Social History Narrative   Lives alone   Caffeine use:  2 cups - coffee   Tea daily   Social Determinants of Health   Financial Resource Strain: Not on file  Food Insecurity: Not on file  Transportation Needs: Not on file  Physical Activity: Not on file  Stress: Not on file  Social Connections: Not on file  Intimate Partner Violence: Not on file     Constitutional: Denies fever, malaise, fatigue, headache or abrupt weight changes.  HEENT: Denies eye pain, eye redness, ear pain, ringing in the ears, wax buildup, runny nose, nasal congestion, bloody nose, or sore throat. Respiratory: Denies difficulty breathing, shortness of breath, cough or sputum production.   Cardiovascular: Denies chest pain, chest tightness, palpitations or swelling in the hands or feet.  Gastrointestinal: Denies abdominal pain, bloating, constipation, diarrhea or blood in the stool.  GU: Denies urgency, frequency, pain with urination, burning sensation, blood in urine, odor or discharge. Musculoskeletal: Denies  decrease in range of motion, difficulty with gait, muscle pain or joint pain and swelling.  Skin: Pt reports dark skin lesion of nose. Denies redness, rashes, or ulcercations.  Neurological: Denies dizziness, difficulty with memory, difficulty with speech or problems with balance and coordination.  Psych: Denies anxiety, depression, SI/HI.  No other specific complaints in a complete review of systems (except as listed in HPI above).  Objective:   Physical Exam  There were no vitals taken for this visit. Wt Readings from Last 3 Encounters:  10/28/20 208 lb (94.3 kg)  10/20/20 205 lb (93 kg)  09/30/20 201 lb (91.2 kg)    General: Appears their stated age, well developed, well nourished in NAD. Skin: Warm, dry and intact. No rashes, lesions or ulcerations noted. HEENT: Head: normal shape and size; Eyes: sclera white, no icterus, conjunctiva pink, PERRLA and EOMs intact; Ears: Tm's gray and intact, normal light reflex; Nose: mucosa pink and moist, septum midline; Throat/Mouth: Teeth present, mucosa pink and moist, no exudate, lesions or ulcerations noted.  Neck:  Neck supple, trachea midline. No masses, lumps or thyromegaly present.  Cardiovascular: Normal rate and rhythm. S1,S2 noted.  No murmur, rubs or gallops noted. No JVD or BLE edema. No carotid bruits noted. Pulmonary/Chest: Normal effort and positive vesicular breath sounds. No respiratory distress. No wheezes, rales or ronchi noted.  Abdomen: Soft and nontender. Normal bowel sounds. No distention or masses noted. Liver, spleen and kidneys non palpable. Musculoskeletal: Normal range of motion. No signs of joint swelling. No difficulty with gait.  Neurological: Alert and oriented. Cranial nerves II-XII grossly intact. Coordination normal.  Psychiatric: Mood and affect normal. Behavior is normal. Judgment and thought content normal.   EKG:  BMET    Component Value Date/Time   NA 137 10/28/2020 1306   K 3.8 10/28/2020 1306   CL  103 10/28/2020 1306   CO2 28 10/28/2020 1306   GLUCOSE 82 10/28/2020 1306   BUN 12 10/28/2020 1306   CREATININE 1.08 10/28/2020 1306   CALCIUM 9.5 10/28/2020 1306   GFRNONAA 59 (L) 09/12/2017 0355   GFRAA >60 09/12/2017 0355    Lipid Panel     Component Value Date/Time   CHOL 156 12/17/2019 1124   TRIG 97.0 12/17/2019 1124   HDL 42.30 12/17/2019 1124   CHOLHDL 4 12/17/2019 1124   VLDL 19.4 12/17/2019 1124   LDLCALC 94 12/17/2019 1124    CBC    Component Value Date/Time   WBC 6.2 09/29/2020 1042   RBC 4.50 09/29/2020 1042   HGB 14.7 09/29/2020 1042   HCT 41.2 09/29/2020 1042   PLT 227 09/29/2020 1042   MCV 91.6 09/29/2020 1042   MCH 32.7 09/29/2020 1042   MCHC 35.7 09/29/2020 1042   RDW 13.0 09/29/2020  1042   LYMPHSABS 1.7 09/10/2017 0612   MONOABS 0.6 09/10/2017 0612   EOSABS 0.2 09/10/2017 0612   BASOSABS 0.1 09/10/2017 0612    Hgb A1C Lab Results  Component Value Date   HGBA1C 5.4 12/17/2019            Assessment & Plan:   [name] Silversmith, NP This visit occurred during the SARS-CoV-2 public health emergency.  Safety protocols were in place, including screening questions prior to the visit, additional usage of staff PPE, and extensive cleaning of exam room while observing appropriate contact time as indicated for disinfecting solutions.

## 2021-01-14 ENCOUNTER — Other Ambulatory Visit: Payer: Self-pay | Admitting: Internal Medicine

## 2021-01-14 ENCOUNTER — Encounter: Payer: Self-pay | Admitting: Internal Medicine

## 2021-01-14 NOTE — Patient Instructions (Signed)
Excision of Skin Lesions, Care After  This sheet gives you information about how to care for yourself after your procedure. Your health care provider may also give you more specific instructions. If you have problems or questions, contact your health care provider.  What can I expect after the procedure?  After your procedure, it is common to have pain or discomfort at the excision site.  Follow these instructions at home:  Excision care       · Follow instructions from your health care provider about how to take care of your excision site. Make sure you:  ? Wash your hands with soap and water before and after you change your bandage (dressing). If soap and water are not available, use hand sanitizer.  ? Change your dressing as told by your health care provider.  ? Leave stitches (sutures), skin glue, or adhesive strips in place. These skin closures may need to stay in place for 2 weeks or longer. If adhesive strip edges start to loosen and curl up, you may trim the loose edges. Do not remove adhesive strips completely unless your health care provider tells you to do that.  · Check the excision area every day for signs of infection. Watch for:  ? Redness, swelling, or pain.  ? Fluid or blood.  ? Warmth.  ? Pus or a bad smell.  · Keep the site clean, dry, and protected for at least 48 hours.  · For bleeding, apply gentle but firm pressure to the area using a folded towel for 20 minutes.  · Avoid high-impact exercise and activities until the sutures are removed or the area heals.  General instructions  · Take over-the-counter and prescription medicines only as told by your health care provider.  · Follow instructions from your health care provider about how to minimize scarring. Scarring should lessen over time.  · Avoid sun exposure until the area has healed. Use sunscreen to protect the area from the sun after it has healed.  · Keep all follow-up visits as told by your health care provider. This is  important.  Contact a health care provider if:  · You have redness, swelling, or pain around your excision site.  · You have fluid or blood coming from your excision site.  · Your excision site feels warm to the touch.  · You have pus or a bad smell coming from your excision site.  · You have a fever.  · You have pain that does not improve in 2-3 days after your procedure.  · You notice skin irregularities or changes in how you feel (sensation).  Summary  · This sheet of instructions provides you with information about caring for yourself after your procedure. Contact your health care provider if you have any problems or questions.  · Take over-the-counter and prescription medicines only as told by your health care provider.  · Change your dressing as told by your health care provider.  · Contact a health care provider if you have redness, swelling, pain, or other signs of infection around your excision site.  · Keep all follow-up visits as told by your health care provider. This is important.  This information is not intended to replace advice given to you by your health care provider. Make sure you discuss any questions you have with your health care provider.  Document Revised: 04/09/2018 Document Reviewed: 04/09/2018  Elsevier Patient Education © 2021 [employer]   

## 2021-04-05 ENCOUNTER — Other Ambulatory Visit: Payer: Self-pay | Admitting: Internal Medicine

## 2021-04-21 ENCOUNTER — Other Ambulatory Visit: Payer: Self-pay

## 2021-04-21 ENCOUNTER — Encounter: Payer: Self-pay | Admitting: Internal Medicine

## 2021-04-21 ENCOUNTER — Ambulatory Visit (INDEPENDENT_AMBULATORY_CARE_PROVIDER_SITE_OTHER): Payer: Medicare HMO | Admitting: Internal Medicine

## 2021-04-21 VITALS — BP 123/73 | HR 71 | Temp 97.8°F | Resp 17 | Ht 70.0 in | Wt 217.4 lb

## 2021-04-21 DIAGNOSIS — F419 Anxiety disorder, unspecified: Secondary | ICD-10-CM

## 2021-04-21 DIAGNOSIS — N1831 Chronic kidney disease, stage 3a: Secondary | ICD-10-CM | POA: Insufficient documentation

## 2021-04-21 DIAGNOSIS — I10 Essential (primary) hypertension: Secondary | ICD-10-CM | POA: Diagnosis not present

## 2021-04-21 DIAGNOSIS — N522 Drug-induced erectile dysfunction: Secondary | ICD-10-CM | POA: Diagnosis not present

## 2021-04-21 DIAGNOSIS — E6609 Other obesity due to excess calories: Secondary | ICD-10-CM | POA: Insufficient documentation

## 2021-04-21 DIAGNOSIS — Z6831 Body mass index (BMI) 31.0-31.9, adult: Secondary | ICD-10-CM

## 2021-04-21 DIAGNOSIS — I6529 Occlusion and stenosis of unspecified carotid artery: Secondary | ICD-10-CM | POA: Diagnosis not present

## 2021-04-21 DIAGNOSIS — M4726 Other spondylosis with radiculopathy, lumbar region: Secondary | ICD-10-CM | POA: Diagnosis not present

## 2021-04-21 DIAGNOSIS — L409 Psoriasis, unspecified: Secondary | ICD-10-CM | POA: Diagnosis not present

## 2021-04-21 DIAGNOSIS — F5101 Primary insomnia: Secondary | ICD-10-CM | POA: Diagnosis not present

## 2021-04-21 DIAGNOSIS — D367 Benign neoplasm of other specified sites: Secondary | ICD-10-CM

## 2021-04-21 DIAGNOSIS — E663 Overweight: Secondary | ICD-10-CM | POA: Insufficient documentation

## 2021-04-21 MED ORDER — MUPIROCIN CALCIUM 2 % EX CREA: 1.0000 "application " | TOPICAL_CREAM | Freq: Two times a day (BID) | CUTANEOUS | 0 refills | Status: DC

## 2021-04-21 NOTE — Patient Instructions (Signed)
Skin Abscess  A skin abscess is an infected area of your skin that contains pus and other material. An abscess can happen in any part of your body. Some abscesses break open (rupture) on their own. Most continue to get worse unless they are treated. The infection can spread deeper into the body and into your blood, which can makeyou feel sick. A skin abscess is caused by germs that enter the skin through a cut or scrape. It can also be caused by blocked oil and sweat glands or infected hairfollicles. This condition is usually treated by: Draining the pus. Taking antibiotic medicines. Placing a warm, wet washcloth over the abscess. Follow these instructions at home: Medicines  Take over-the-counter and prescription medicines only as told by your doctor. If you were prescribed an antibiotic medicine, take it as told by your doctor. Do not stop taking the antibiotic even if you start to feel better.  Abscess care  If you have an abscess that has not drained, place a warm, clean, wet washcloth over the abscess several times a day. Do this as told by your doctor. Follow instructions from your doctor about how to take care of your abscess. Make sure you: Cover the abscess with a bandage (dressing). Change your bandage or gauze as told by your doctor. Wash your hands with soap and water before you change the bandage or gauze. If you cannot use soap and water, use hand sanitizer. Check your abscess every day for signs that the infection is getting worse. Check for: More redness, swelling, or pain. More fluid or blood. Warmth. More pus or a bad smell.  General instructions To avoid spreading the infection: Do not share personal care items, towels, or hot tubs with others. Avoid making skin-to-skin contact with other people. Keep all follow-up visits as told by your doctor. This is important. Contact a doctor if: You have more redness, swelling, or pain around your abscess. You have more  fluid or blood coming from your abscess. Your abscess feels warm when you touch it. You have more pus or a bad smell coming from your abscess. You have a fever. Your muscles ache. You have chills. You feel sick. Get help right away if: You have very bad (severe) pain. You see red streaks on your skin spreading away from the abscess. Summary A skin abscess is an infected area of your skin that contains pus and other material. The abscess is caused by germs that enter the skin through a cut or scrape. It can also be caused by blocked oil and sweat glands or infected hair follicles. Follow your doctor's instructions on caring for your abscess, taking medicines, preventing infections, and keeping follow-up visits. This information is not intended to replace advice given to you by your health care provider. Make sure you discuss any questions you have with your healthcare provider. Document Revised: 05/07/2019 Document Reviewed: 11/14/2017 Elsevier Patient Education  2022 [employer].

## 2021-04-21 NOTE — Progress Notes (Signed)
Subjective:    Patient ID: Peter Rose, male    DOB: [birth year], 65 y.o.   MRN: [id number]  HPI  Pt presents to the clinic today with c/o a lesion on his left forearm. He noticed this Sunday.  HTN: His BP today is 123/73. He is taking Lisinopril HCT as prescribed. ECG from 09/2020 reviewed.  Carotid Atherosclerosis: His last LDL was 94, triglycerides 97, 12/2019. He is not currently on cholesterol lowering medication. He does not consume a low fat diet.  Lumbar Radiculopathy: Currently not an issue.  He is not taking any medication for this at this time.  Psoriasis: Managed on MTX. He follows with dermatology.  Anxiety: Currently not an issue.  He is not currently see a therapist. He denies depression, SI/HI.  Insomnia: He has difficulty falling asleep. He is taking Trazadone as prescribed.  There is no sleep study on file.  ED: Managed with Sildenafil as needed.  CKD 3: His last creatinine was 1.08, GFR 59, 10/2020.  He is on Lisinopril for renal protection.  He does not follow with nephrology.  He also reports a cyst on his left forearm.  He noticed this 1 week ago.  He reports it did increase in size and eventually drain green/yellow pus.  He has not noticed any warmth or redness.  He denies fever, chills or body aches.  He has not tried anything OTC for this.  Review of Systems     Past Medical History:  Diagnosis Date   Complication of anesthesia    abdominal distention (brief ileus ves opioid induced constipation) following 09/10/17 knee arthroscopy   COVID    GERD (gastroesophageal reflux disease)    occasional   Medical history non-contributory    Psoriasis     Current Outpatient Medications  Medication Sig Dispense Refill   Carboxymethylcellul-Glycerin (LUBRICATING EYE DROPS OP) Place 1 drop into both eyes daily as needed (dry eyes).     ibuprofen (ADVIL) 200 MG tablet Take 600-800 mg by mouth every 6 (six) hours as needed for moderate pain.     levocetirizine  (XYZAL) 5 MG tablet Take 1 tablet (5 mg total) by mouth at bedtime as needed for allergies. 90 tablet 3   lisinopril-hydrochlorothiazide (ZESTORETIC) 10-12.5 MG tablet Take 1 tablet by mouth daily. 90 tablet 0   methotrexate 2.5 MG tablet TAKE 10 TABLET BY MOUTH ONCE A WEEK WHEN  NEEDED  FOR  PSORIASIS  FLARE. 90 tablet 0   mometasone (NASONEX) 50 MCG/ACT nasal spray Place 2 sprays into the nose daily. 17 g 2   sildenafil (VIAGRA) 100 MG tablet Take 1 tablet (100 mg total) by mouth as needed for erectile dysfunction. 10 tablet 5   traZODone (DESYREL) 100 MG tablet Take 1 tablet (100 mg total) by mouth at bedtime. 90 tablet 0   No current facility-administered medications for this visit.    Allergies  Allergen Reactions   Codeine Other (See Comments)    feels wired and jittery   Hycodan [Hydrocodone Bit-Homatrop Mbr]     Makes pt feel weird/jittery   Vicodin [Hydrocodone-Acetaminophen] Other (See Comments)    Pt feels "wired" with pain meds    Family History  Problem Relation Age of Onset   Headache Neg Hx     Social History   Socioeconomic History   Marital status: Single    Spouse name: Not on file   Number of children: 3   Years of education: 46   Highest education level: Not  on file  Occupational History   Occupation: [employer]  Tobacco Use   Smoking status: Former    Packs/day: 1.00    Pack years: 0.00    Types: Cigarettes    Quit date: 01/06/2017    Years since quitting: 4.2   Smokeless tobacco: Never  Vaping Use   Vaping Use: Never used  Substance and Sexual Activity   Alcohol use: Yes    Comment: occasional   Drug use: No   Sexual activity: Not on file  Other Topics Concern   Not on file  Social History Narrative   Lives alone   Caffeine use:  2 cups - coffee   Tea daily   Social Determinants of Health   Financial Resource Strain: Not on file  Food Insecurity: Not on file  Transportation Needs: Not on file  Physical Activity: Not on file  Stress:  Not on file  Social Connections: Not on file  Intimate Partner Violence: Not on file     Constitutional: Denies fever, malaise, fatigue, headache or abrupt weight changes.  HEENT: Denies eye pain, eye redness, ear pain, ringing in the ears, wax buildup, runny nose, nasal congestion, bloody nose, or sore throat. Respiratory: Denies difficulty breathing, shortness of breath, cough or sputum production.   Cardiovascular: Denies chest pain, chest tightness, palpitations or swelling in the hands or feet.  Gastrointestinal: Denies abdominal pain, bloating, constipation, diarrhea or blood in the stool.  GU: Patient reports erectile dysfunction.  Denies urgency, frequency, pain with urination, burning sensation, blood in urine, odor or discharge. Musculoskeletal: Denies decrease in range of motion, difficulty with gait, or joint swelling.  Skin: Patient reports rash, cyst of left forearm.  Denies ulcerations.  Neurological: Patient reports insomnia.  Denies dizziness, difficulty with memory, difficulty with speech or problems with balance and coordination.  Psych: Patient has a history of anxiety.  Denies depression, SI/HI.  No other specific complaints in a complete review of systems (except as listed in HPI above).  Objective:   Physical Exam   BP 123/73 (BP Location: Right Arm, Patient Position: Sitting, Cuff Size: Large)   Pulse 71   Temp 97.8 F (36.6 C) (Temporal)   Resp 17   Ht 5\' 10"  (1.778 m)   Wt 217 lb 6.4 oz (98.6 kg)   SpO2 99%   BMI 31.19 kg/m   Wt Readings from Last 3 Encounters:  01/13/21 212 lb (96.2 kg)  10/28/20 208 lb (94.3 kg)  10/20/20 205 lb (93 kg)    General: Appears his stated age, obese, in NAD. Skin: Warm, dry and intact.  1 cm scabbed lesion with surrounding redness noted of the left forearm. HEENT: Head: normal shape and size; Eyes: sclera white and EOMs intact;  Cardiovascular: Normal rate and rhythm. S1,S2 noted.  No murmur, rubs or gallops noted. No  JVD or BLE edema. No carotid bruits noted. Pulmonary/Chest: Normal effort and positive vesicular breath sounds. No respiratory distress. No wheezes, rales or ronchi noted.  Musculoskeletal:  No difficulty with gait.  Neurological: Alert and oriented.  Psychiatric: Mood and affect normal. Behavior is normal. Judgment and thought content normal.    BMET    Component Value Date/Time   NA 137 10/28/2020 1306   K 3.8 10/28/2020 1306   CL 103 10/28/2020 1306   CO2 28 10/28/2020 1306   GLUCOSE 82 10/28/2020 1306   BUN 12 10/28/2020 1306   CREATININE 1.08 10/28/2020 1306   CALCIUM 9.5 10/28/2020 1306   GFRNONAA 59 (  L) 09/12/2017 0355   GFRAA >60 09/12/2017 0355    Lipid Panel     Component Value Date/Time   CHOL 156 12/17/2019 1124   TRIG 97.0 12/17/2019 1124   HDL 42.30 12/17/2019 1124   CHOLHDL 4 12/17/2019 1124   VLDL 19.4 12/17/2019 1124   LDLCALC 94 12/17/2019 1124    CBC    Component Value Date/Time   WBC 6.2 09/29/2020 1042   RBC 4.50 09/29/2020 1042   HGB 14.7 09/29/2020 1042   HCT 41.2 09/29/2020 1042   PLT 227 09/29/2020 1042   MCV 91.6 09/29/2020 1042   MCH 32.7 09/29/2020 1042   MCHC 35.7 09/29/2020 1042   RDW 13.0 09/29/2020 1042   LYMPHSABS 1.7 09/10/2017 0612   MONOABS 0.6 09/10/2017 0612   EOSABS 0.2 09/10/2017 0612   BASOSABS 0.1 09/10/2017 0612    Hgb A1C Lab Results  Component Value Date   HGBA1C 5.4 12/17/2019           Assessment & Plan:  Cyst of Left Forearm:  Already ruptured Rx for Mupirocin twice daily until resolved  Schedule an appointment for Medicare wellness exam.  [name] Silversmith, NP This visit occurred during the SARS-CoV-2 public health emergency.  Safety protocols were in place, including screening questions prior to the visit, additional usage of staff PPE, and extensive cleaning of exam room while observing appropriate contact time as indicated for disinfecting solutions.

## 2021-04-22 NOTE — Assessment & Plan Note (Signed)
Continue Lisinopril for renal protection

## 2021-04-22 NOTE — Assessment & Plan Note (Signed)
We will check a lipid profile at annual exam Encouraged him to consume a low-fat diet

## 2021-04-22 NOTE — Assessment & Plan Note (Signed)
Managed on Methotrexate He will continue to follow with dermatology

## 2021-04-22 NOTE — Assessment & Plan Note (Signed)
Currently not an issue Will monitor 

## 2021-04-22 NOTE — Assessment & Plan Note (Signed)
Continue Sildenafil as needed 

## 2021-04-22 NOTE — Assessment & Plan Note (Signed)
Continue Trazodone Will monitor

## 2021-04-22 NOTE — Assessment & Plan Note (Signed)
Encourage diet and exercise for weight loss 

## 2021-04-22 NOTE — Assessment & Plan Note (Signed)
Currently not an issue 

## 2021-04-22 NOTE — Assessment & Plan Note (Signed)
Controlled on Lisinopril HCT Reinforced DASH diet and exercise weight loss

## 2021-04-26 ENCOUNTER — Other Ambulatory Visit: Payer: Self-pay | Admitting: Internal Medicine

## 2021-04-26 ENCOUNTER — Other Ambulatory Visit: Payer: Self-pay

## 2021-04-26 ENCOUNTER — Telehealth: Payer: Self-pay

## 2021-04-26 NOTE — Telephone Encounter (Signed)
Drug change request for Mupirocin 2%. Drug is not covered by the patient plan.

## 2021-04-26 NOTE — Telephone Encounter (Signed)
If not covered, only alternative is neosporin OTC

## 2021-04-28 ENCOUNTER — Telehealth: Payer: Self-pay

## 2021-04-28 NOTE — Telephone Encounter (Signed)
I called and left a message on the patient voicemail with [facility] recommendations.

## 2021-04-28 NOTE — Telephone Encounter (Signed)
Copied from [facility] [id number]. 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

## 2021-04-28 NOTE — Telephone Encounter (Signed)
The pt was calling to find out if [facility] got DOT certified. I informed him not at this time, but it is on the table and hopefully something that will happen in the near future.

## 2021-05-21 ENCOUNTER — Other Ambulatory Visit: Payer: Self-pay | Admitting: Internal Medicine

## 2021-05-21 NOTE — Telephone Encounter (Signed)
d/c'd 01/16/21 pt preference [place], [employer]

## 2021-06-07 ENCOUNTER — Telehealth: Payer: Self-pay | Admitting: Internal Medicine

## 2021-06-15 ENCOUNTER — Other Ambulatory Visit: Payer: Self-pay | Admitting: Internal Medicine

## 2021-06-15 NOTE — Telephone Encounter (Signed)
Medication discontinued 01/13/21 patient preference

## 2021-06-22 ENCOUNTER — Other Ambulatory Visit: Payer: Self-pay | Admitting: Internal Medicine

## 2021-06-22 DIAGNOSIS — F5101 Primary insomnia: Secondary | ICD-10-CM

## 2021-07-26 ENCOUNTER — Other Ambulatory Visit: Payer: Self-pay

## 2021-07-26 ENCOUNTER — Other Ambulatory Visit: Payer: Self-pay | Admitting: Internal Medicine

## 2021-07-26 NOTE — Telephone Encounter (Signed)
Requested medications are due for refill today.  yes  Requested medications are on the active medications list.  yes  Last refill. 04/05/2021  Future visit scheduled.   no  Notes to clinic.  Medication not delegated. Rx last signed by P.Justin Mend FNP.

## 2021-07-27 MED ORDER — METHOTREXATE SODIUM 2.5 MG PO TABS: ORAL_TABLET | ORAL | 1 refills | Status: DC

## 2021-08-02 ENCOUNTER — Telehealth: Payer: Self-pay

## 2021-08-02 NOTE — Telephone Encounter (Signed)
Copied from [place] [id number]. Topic: General - Inquiry >> Aug 02, 2021  4:22 PM Oneta Rack wrote: Reason for CRM: As a FYI please send all future medications to [facility], Phone:  [phone], Fax:  [phone] due to medication being cheaper.

## 2021-08-03 ENCOUNTER — Other Ambulatory Visit: Payer: Self-pay

## 2021-09-29 DIAGNOSIS — D2262 Melanocytic nevi of left upper limb, including shoulder: Secondary | ICD-10-CM | POA: Diagnosis not present

## 2021-09-29 DIAGNOSIS — C4331 Malignant melanoma of nose: Secondary | ICD-10-CM | POA: Diagnosis not present

## 2021-09-29 DIAGNOSIS — L538 Other specified erythematous conditions: Secondary | ICD-10-CM | POA: Diagnosis not present

## 2021-09-29 DIAGNOSIS — L820 Inflamed seborrheic keratosis: Secondary | ICD-10-CM | POA: Diagnosis not present

## 2021-09-29 DIAGNOSIS — D2272 Melanocytic nevi of left lower limb, including hip: Secondary | ICD-10-CM | POA: Diagnosis not present

## 2021-09-29 DIAGNOSIS — C4372 Malignant melanoma of left lower limb, including hip: Secondary | ICD-10-CM | POA: Diagnosis not present

## 2021-09-29 DIAGNOSIS — D485 Neoplasm of uncertain behavior of skin: Secondary | ICD-10-CM | POA: Diagnosis not present

## 2021-09-29 DIAGNOSIS — D225 Melanocytic nevi of trunk: Secondary | ICD-10-CM | POA: Diagnosis not present

## 2021-09-29 DIAGNOSIS — D2271 Melanocytic nevi of right lower limb, including hip: Secondary | ICD-10-CM | POA: Diagnosis not present

## 2021-09-29 DIAGNOSIS — D2261 Melanocytic nevi of right upper limb, including shoulder: Secondary | ICD-10-CM | POA: Diagnosis not present

## 2021-10-04 ENCOUNTER — Other Ambulatory Visit: Payer: Self-pay | Admitting: Internal Medicine

## 2021-11-04 ENCOUNTER — Other Ambulatory Visit: Payer: Self-pay | Admitting: Internal Medicine

## 2021-11-04 DIAGNOSIS — F5101 Primary insomnia: Secondary | ICD-10-CM

## 2021-11-04 NOTE — Telephone Encounter (Signed)
Requested Prescriptions  Pending Prescriptions Disp Refills   traZODone (DESYREL) 100 MG tablet [Pharmacy Med Name: traZODone HCl 100 MG Oral Tablet] 30 tablet 0    Sig: TAKE 1 TABLET BY MOUTH AT BEDTIME     Psychiatry: Antidepressants - Serotonin Modulator Failed - 11/04/2021  9:55 AM      Failed - Valid encounter within last 6 months    Recent Outpatient Visits          6 months ago Stage 3a chronic kidney disease ([place])   [facility], Coralie Keens, NP      Future Appointments            In 5 days Garnette Gunner, Coralie Keens, NP [facility]            levocetirizine (XYZAL) 5 MG tablet [Pharmacy Med Name: Levocetirizine Dihydrochloride 5 MG Oral Tablet] 30 tablet 0    Sig: TAKE 1 TABLET BY MOUTH AT BEDTIME AS NEEDED FOR ALLERGIES     Ear, Nose, and Throat:  Antihistamines Passed - 11/04/2021  9:55 AM      Passed - Valid encounter within last 12 months    Recent Outpatient Visits          6 months ago Stage 3a chronic kidney disease ([place])   [facility] [place], Coralie Keens, NP      Future Appointments            In 5 days [facility], Coralie Keens, NP [facility], [facility]

## 2021-11-09 ENCOUNTER — Telehealth: Payer: Medicare HMO | Admitting: Internal Medicine

## 2021-11-14 DIAGNOSIS — Z481 Encounter for planned postprocedural wound closure: Secondary | ICD-10-CM | POA: Diagnosis not present

## 2021-11-14 DIAGNOSIS — C4331 Malignant melanoma of nose: Secondary | ICD-10-CM | POA: Diagnosis not present

## 2021-11-14 DIAGNOSIS — C439 Malignant melanoma of skin, unspecified: Secondary | ICD-10-CM | POA: Diagnosis not present

## 2021-11-14 DIAGNOSIS — L989 Disorder of the skin and subcutaneous tissue, unspecified: Secondary | ICD-10-CM | POA: Diagnosis not present

## 2021-11-14 HISTORY — PX: MOHS SURGERY: SUR867

## 2021-11-16 ENCOUNTER — Encounter: Payer: Medicare HMO | Admitting: Internal Medicine

## 2021-11-16 NOTE — Progress Notes (Deleted)
Subjective:    Patient ID: Peter Rose, male    DOB: March 20, 1956, 66 y.o.   MRN: [id number]  HPI  Patient presents the clinic today for his annual exam.  Flu: Tetanus: 05/2012 COVID: Pneumovax: Prevnar: Shingrix: PSA screening: 12/2019 Colon screening: Vision screening: Dentist:  Diet: Exercise:  Review of Systems     Past Medical History:  Diagnosis Date   Complication of anesthesia    abdominal distention (brief ileus ves opioid induced constipation) following 09/10/17 knee arthroscopy   COVID    GERD (gastroesophageal reflux disease)    occasional   Medical history non-contributory    Psoriasis     Current Outpatient Medications  Medication Sig Dispense Refill   levocetirizine (XYZAL) 5 MG tablet TAKE 1 TABLET BY MOUTH AT BEDTIME AS NEEDED FOR ALLERGIES 30 tablet 0   traZODone (DESYREL) 100 MG tablet TAKE 1 TABLET BY MOUTH AT BEDTIME 30 tablet 0   Carboxymethylcellul-Glycerin (LUBRICATING EYE DROPS OP) Place 1 drop into both eyes daily as needed (dry eyes).     ibuprofen (ADVIL) 200 MG tablet Take 600-800 mg by mouth every 6 (six) hours as needed for moderate pain.     lisinopril-hydrochlorothiazide (ZESTORETIC) 10-12.5 MG tablet Take 1 tablet by mouth daily. 90 tablet 0   methotrexate 2.5 MG tablet TAKE 10 TABLET BY MOUTH ONCE A WEEK WHEN  NEEDED  FOR  PSORIASIS  FLARE. 90 tablet 1   mupirocin cream (BACTROBAN) 2 % Apply 1 application topically 2 (two) times daily. 15 g 0   sildenafil (VIAGRA) 100 MG tablet TAKE 1 TABLET BY MOUTH AS NEEDED FOR ED. 10 tablet 3   No current facility-administered medications for this visit.    Allergies  Allergen Reactions   Codeine Other (See Comments)    feels wired and jittery   Hycodan [Hydrocodone Bit-Homatrop Mbr]     Makes pt feel weird/jittery   Vicodin [Hydrocodone-Acetaminophen] Other (See Comments)    Pt feels "wired" with pain meds    Family History  Problem Relation Age of Onset   Headache Neg Hx      Social History   Socioeconomic History   Marital status: Single    Spouse name: Not on file   Number of children: 3   Years of education: 32   Highest education level: Not on file  Occupational History   Occupation: [employer]  Tobacco Use   Smoking status: Former    Packs/day: 1.00    Types: Cigarettes    Quit date: 01/06/2017    Years since quitting: 4.8   Smokeless tobacco: Never  Vaping Use   Vaping Use: Never used  Substance and Sexual Activity   Alcohol use: Yes    Comment: occasional   Drug use: No   Sexual activity: Not on file  Other Topics Concern   Not on file  Social History Narrative   Lives alone   Caffeine use:  2 cups - coffee   Tea daily   Social Determinants of Health   Financial Resource Strain: Not on file  Food Insecurity: Not on file  Transportation Needs: Not on file  Physical Activity: Not on file  Stress: Not on file  Social Connections: Not on file  Intimate Partner Violence: Not on file     Constitutional: Denies fever, malaise, fatigue, headache or abrupt weight changes.  HEENT: Denies eye pain, eye redness, ear pain, ringing in the ears, wax buildup, runny nose, nasal congestion, bloody nose, or sore throat. Respiratory:  Denies difficulty breathing, shortness of breath, cough or sputum production.   Cardiovascular: Denies chest pain, chest tightness, palpitations or swelling in the hands or feet.  Gastrointestinal: Denies abdominal pain, bloating, constipation, diarrhea or blood in the stool.  GU: Patient reports erectile dysfunction.  Denies urgency, frequency, pain with urination, burning sensation, blood in urine, odor or discharge. Musculoskeletal: Denies decrease in range of motion, difficulty with gait, muscle pain or joint pain and swelling.  Skin: Patient reports psoriasis.  Denies ulcercations.  Neurological: Patient reports insomnia.  Denies dizziness, difficulty with memory, difficulty with speech or problems with balance  and coordination.  Psych: Patient has a history of anxiety.Denies depression, SI/HI.  No other specific complaints in a complete review of systems (except as listed in HPI above).  Objective:   Physical Exam  There were no vitals taken for this visit. Wt Readings from Last 3 Encounters:  04/21/21 217 lb 6.4 oz (98.6 kg)  01/13/21 212 lb (96.2 kg)  10/28/20 208 lb (94.3 kg)    General: Appears their stated age, well developed, well nourished in NAD. Skin: Warm, dry and intact. No rashes, lesions or ulcerations noted. HEENT: Head: normal shape and size; Eyes: sclera white, no icterus, conjunctiva pink, PERRLA and EOMs intact; Ears: Tm's gray and intact, normal light reflex; Nose: mucosa pink and moist, septum midline; Throat/Mouth: Teeth present, mucosa pink and moist, no exudate, lesions or ulcerations noted.  Neck:  Neck supple, trachea midline. No masses, lumps or thyromegaly present.  Cardiovascular: Normal rate and rhythm. S1,S2 noted.  No murmur, rubs or gallops noted. No JVD or BLE edema. No carotid bruits noted. Pulmonary/Chest: Normal effort and positive vesicular breath sounds. No respiratory distress. No wheezes, rales or ronchi noted.  Abdomen: Soft and nontender. Normal bowel sounds. No distention or masses noted. Liver, spleen and kidneys non palpable. Musculoskeletal: Normal range of motion. No signs of joint swelling. No difficulty with gait.  Neurological: Alert and oriented. Cranial nerves II-XII grossly intact. Coordination normal.  Psychiatric: Mood and affect normal. Behavior is normal. Judgment and thought content normal.    BMET    Component Value Date/Time   NA 137 10/28/2020 1306   K 3.8 10/28/2020 1306   CL 103 10/28/2020 1306   CO2 28 10/28/2020 1306   GLUCOSE 82 10/28/2020 1306   BUN 12 10/28/2020 1306   CREATININE 1.08 10/28/2020 1306   CALCIUM 9.5 10/28/2020 1306   GFRNONAA 59 (L) 09/12/2017 0355   GFRAA >60 09/12/2017 0355    Lipid Panel      Component Value Date/Time   CHOL 156 12/17/2019 1124   TRIG 97.0 12/17/2019 1124   HDL 42.30 12/17/2019 1124   CHOLHDL 4 12/17/2019 1124   VLDL 19.4 12/17/2019 1124   LDLCALC 94 12/17/2019 1124    CBC    Component Value Date/Time   WBC 6.2 09/29/2020 1042   RBC 4.50 09/29/2020 1042   HGB 14.7 09/29/2020 1042   HCT 41.2 09/29/2020 1042   PLT 227 09/29/2020 1042   MCV 91.6 09/29/2020 1042   MCH 32.7 09/29/2020 1042   MCHC 35.7 09/29/2020 1042   RDW 13.0 09/29/2020 1042   LYMPHSABS 1.7 09/10/2017 0612   MONOABS 0.6 09/10/2017 0612   EOSABS 0.2 09/10/2017 0612   BASOSABS 0.1 09/10/2017 0612    Hgb A1C Lab Results  Component Value Date   HGBA1C 5.4 12/17/2019            Assessment & Plan:   Preventative Health Maintenance:  Flu shot Advised him if he gets bit or cut he will need to go get a tetanus vaccine as his insurance does not pay for this as preventative Pneumovax Prevnar Encouraged him to get his COVID-vaccine Discussed Shingrix, if he would like to get this he can have this done at the pharmacy PSA today Referral to GI for screening colonoscopy Encouraged him to consume a balanced diet and exercise regimen Advised him to see an eye doctor and dentist annually We will check CBC, c-Met, lipid, PSA and hep C today  RTC in 6 months, follow-up chronic conditions [name] Silversmith, NP This visit occurred during the SARS-CoV-2 public health emergency.  Safety protocols were in place, including screening questions prior to the visit, additional usage of staff PPE, and extensive cleaning of exam room while observing appropriate contact time as indicated for disinfecting solutions.

## 2021-11-17 DIAGNOSIS — C4331 Malignant melanoma of nose: Secondary | ICD-10-CM | POA: Diagnosis not present

## 2021-11-22 ENCOUNTER — Encounter: Payer: Medicare HMO | Admitting: Internal Medicine

## 2021-11-23 HISTORY — PX: MELANOMA EXCISION: SHX5266

## 2021-11-30 DIAGNOSIS — D0372 Melanoma in situ of left lower limb, including hip: Secondary | ICD-10-CM | POA: Diagnosis not present

## 2021-11-30 DIAGNOSIS — C4372 Malignant melanoma of left lower limb, including hip: Secondary | ICD-10-CM | POA: Diagnosis not present

## 2021-12-05 DIAGNOSIS — Z481 Encounter for planned postprocedural wound closure: Secondary | ICD-10-CM | POA: Diagnosis not present

## 2021-12-05 DIAGNOSIS — C4331 Malignant melanoma of nose: Secondary | ICD-10-CM | POA: Diagnosis not present

## 2021-12-14 ENCOUNTER — Encounter: Payer: Self-pay | Admitting: Internal Medicine

## 2021-12-14 ENCOUNTER — Ambulatory Visit (INDEPENDENT_AMBULATORY_CARE_PROVIDER_SITE_OTHER): Payer: Medicare HMO | Admitting: Internal Medicine

## 2021-12-14 ENCOUNTER — Other Ambulatory Visit: Payer: Self-pay

## 2021-12-14 VITALS — BP 130/81 | HR 64 | Temp 97.1°F | Ht 70.0 in | Wt 215.0 lb

## 2021-12-14 DIAGNOSIS — E6609 Other obesity due to excess calories: Secondary | ICD-10-CM | POA: Diagnosis not present

## 2021-12-14 DIAGNOSIS — R7309 Other abnormal glucose: Secondary | ICD-10-CM

## 2021-12-14 DIAGNOSIS — Z0001 Encounter for general adult medical examination with abnormal findings: Secondary | ICD-10-CM

## 2021-12-14 DIAGNOSIS — Z125 Encounter for screening for malignant neoplasm of prostate: Secondary | ICD-10-CM | POA: Diagnosis not present

## 2021-12-14 DIAGNOSIS — Z1159 Encounter for screening for other viral diseases: Secondary | ICD-10-CM | POA: Diagnosis not present

## 2021-12-14 DIAGNOSIS — Z6830 Body mass index (BMI) 30.0-30.9, adult: Secondary | ICD-10-CM

## 2021-12-14 DIAGNOSIS — E785 Hyperlipidemia, unspecified: Secondary | ICD-10-CM | POA: Diagnosis not present

## 2021-12-14 MED ORDER — LISINOPRIL-HYDROCHLOROTHIAZIDE 10-12.5 MG PO TABS: 0.5000 | ORAL_TABLET | Freq: Every day | ORAL | 1 refills | Status: DC

## 2021-12-14 MED ORDER — LEVOCETIRIZINE DIHYDROCHLORIDE 5 MG PO TABS: ORAL_TABLET | ORAL | 1 refills | Status: DC

## 2021-12-14 MED ORDER — TRAZODONE HCL 100 MG PO TABS: 100.0000 mg | ORAL_TABLET | Freq: Every day | ORAL | 1 refills | Status: DC

## 2021-12-14 MED ORDER — METHOTREXATE SODIUM 2.5 MG PO TABS: ORAL_TABLET | ORAL | 1 refills | Status: DC

## 2021-12-14 NOTE — Patient Instructions (Signed)
Health Maintenance After Age 67 After age 67, you are at a higher risk for certain long-term diseases and infections as well as injuries from falls. Falls are a major cause of broken bones and head injuries in people who are older than age 67. Getting regular preventive care can help to keep you healthy and well. Preventive care includes getting regular testing and making lifestyle changes as recommended by your health care provider. Talk with your health care provider about: Which screenings and tests you should have. A screening is a test that checks for a disease when you have no symptoms. A diet and exercise plan that is right for you. What should I know about screenings and tests to prevent falls? Screening and testing are the best ways to find a health problem early. Early diagnosis and treatment give you the best chance of managing medical conditions that are common after age 67. Certain conditions and lifestyle choices may make you more likely to have a fall. Your health care provider may recommend: Regular vision checks. Poor vision and conditions such as cataracts can make you more likely to have a fall. If you wear glasses, make sure to get your prescription updated if your vision changes. Medicine review. Work with your health care provider to regularly review all of the medicines you are taking, including over-the-counter medicines. Ask your health care provider about any side effects that may make you more likely to have a fall. Tell your health care provider if any medicines that you take make you feel dizzy or sleepy. Strength and balance checks. Your health care provider may recommend certain tests to check your strength and balance while standing, walking, or changing positions. Foot health exam. Foot pain and numbness, as well as not wearing proper footwear, can make you more likely to have a fall. Screenings, including: Osteoporosis screening. Osteoporosis is a condition that causes  the bones to get weaker and break more easily. Blood pressure screening. Blood pressure changes and medicines to control blood pressure can make you feel dizzy. Depression screening. You may be more likely to have a fall if you have a fear of falling, feel depressed, or feel unable to do activities that you used to do. Alcohol use screening. Using too much alcohol can affect your balance and may make you more likely to have a fall. Follow these instructions at home: Lifestyle Do not drink alcohol if: Your health care provider tells you not to drink. If you drink alcohol: Limit how much you have to: 0-1 drink a day for women. 0-2 drinks a day for men. Know how much alcohol is in your drink. In the [place] one drink equals one 12 oz bottle of beer (355 mL), one 5 oz glass of wine (148 mL), or one 1 oz glass of hard liquor (44 mL). Do not use any products that contain nicotine or tobacco. These products include cigarettes, chewing tobacco, and vaping devices, such as e-cigarettes. If you need help quitting, ask your health care provider. Activity  Follow a regular exercise program to stay fit. This will help you maintain your balance. Ask your health care provider what types of exercise are appropriate for you. If you need a cane or walker, use it as recommended by your health care provider. Wear supportive shoes that have nonskid soles. Safety  Remove any tripping hazards, such as rugs, cords, and clutter. Install safety equipment such as grab bars in bathrooms and safety rails on stairs. Keep rooms and walkways   well-lit. General instructions Talk with your health care provider about your risks for falling. Tell your health care provider if: You fall. Be sure to tell your health care provider about all falls, even ones that seem minor. You feel dizzy, tiredness (fatigue), or off-balance. Take over-the-counter and prescription medicines only as told by your health care provider. These include  supplements. Eat a healthy diet and maintain a healthy weight. A healthy diet includes low-fat dairy products, low-fat (lean) meats, and fiber from whole grains, beans, and lots of fruits and vegetables. Stay current with your vaccines. Schedule regular health, dental, and eye exams. Summary Having a healthy lifestyle and getting preventive care can help to protect your health and wellness after age 67. Screening and testing are the best way to find a health problem early and help you avoid having a fall. Early diagnosis and treatment give you the best chance for managing medical conditions that are more common for people who are older than age 67. Falls are a major cause of broken bones and head injuries in people who are older than age 67. Take precautions to prevent a fall at home. Work with your health care provider to learn what changes you can make to improve your health and wellness and to prevent falls. This information is not intended to replace advice given to you by your health care provider. Make sure you discuss any questions you have with your health care provider. Document Revised: 02/20/2021 Document Reviewed: 02/20/2021 Elsevier Patient Education  2022 [employer]  

## 2021-12-14 NOTE — Assessment & Plan Note (Signed)
Encourage diet and exercise for weight loss 

## 2021-12-14 NOTE — Progress Notes (Signed)
? ?Subjective:  ? ? Patient ID: Peter Rose, male    DOB: [birth year], 66 y.o.   MRN: [id number] ? ?HPI ? ?Patient presents to clinic today for his annual exam. ? ?Flu: never ?Tetanus: 05/2012 ?COVID: never ?Pneumovax: never ?Prevnar: never ?Shingrix: never ?PSA screening: 12/2019  ?Colon screening: never ?Vision screening: annually ?Dentist: annually ? ?Diet: He does eat meat. He consumes fruits and veggies. He does eat some fried foods. He drinks mostly water, sweet tea, coffee, soda ?Exercise: Walking ? ?Review of Systems ? ?   ?Past Medical History:  ?Diagnosis Date  ? Complication of anesthesia   ? abdominal distention (brief ileus ves opioid induced constipation) following 09/10/17 knee arthroscopy  ? COVID   ? GERD (gastroesophageal reflux disease)   ? occasional  ? Medical history non-contributory   ? Psoriasis   ? ? ?Current Outpatient Medications  ?Medication Sig Dispense Refill  ? levocetirizine (XYZAL) 5 MG tablet TAKE 1 TABLET BY MOUTH AT BEDTIME AS NEEDED FOR ALLERGIES 30 tablet 0  ? traZODone (DESYREL) 100 MG tablet TAKE 1 TABLET BY MOUTH AT BEDTIME 30 tablet 0  ? Carboxymethylcellul-Glycerin (LUBRICATING EYE DROPS OP) Place 1 drop into both eyes daily as needed (dry eyes).    ? ibuprofen (ADVIL) 200 MG tablet Take 600-800 mg by mouth every 6 (six) hours as needed for moderate pain.    ? lisinopril-hydrochlorothiazide (ZESTORETIC) 10-12.5 MG tablet Take 1 tablet by mouth daily. 90 tablet 0  ? methotrexate 2.5 MG tablet TAKE 10 TABLET BY MOUTH ONCE A WEEK WHEN  NEEDED  FOR  PSORIASIS  FLARE. 90 tablet 1  ? mupirocin cream (BACTROBAN) 2 % Apply 1 application topically 2 (two) times daily. 15 g 0  ? sildenafil (VIAGRA) 100 MG tablet TAKE 1 TABLET BY MOUTH AS NEEDED FOR ED. 10 tablet 3  ? ?No current facility-administered medications for this visit.  ? ? ?Allergies  ?Allergen Reactions  ? Codeine Other (See Comments)  ?  feels wired and jittery  ? Hycodan [Hydrocodone Bit-Homatrop Mbr]   ?  Makes pt feel  weird/jittery  ? Vicodin [Hydrocodone-Acetaminophen] Other (See Comments)  ?  Pt feels "wired" with pain meds  ? ? ?Family History  ?Problem Relation Age of Onset  ? Headache Neg Hx   ? ? ?Social History  ? ?Socioeconomic History  ? Marital status: Single  ?  Spouse name: Not on file  ? Number of children: 3  ? Years of education: 79  ? Highest education level: Not on file  ?Occupational History  ? Occupation: [employer]  ?Tobacco Use  ? Smoking status: Former  ?  Packs/day: 1.00  ?  Types: Cigarettes  ?  Quit date: 01/06/2017  ?  Years since quitting: 4.9  ? Smokeless tobacco: Never  ?Vaping Use  ? Vaping Use: Never used  ?Substance and Sexual Activity  ? Alcohol use: Yes  ?  Comment: occasional  ? Drug use: No  ? Sexual activity: Not on file  ?Other Topics Concern  ? Not on file  ?Social History Narrative  ? Lives alone  ? Caffeine use:  2 cups - coffee  ? Tea daily  ? ?Social Determinants of Health  ? ?Financial Resource Strain: Not on file  ?Food Insecurity: Not on file  ?Transportation Needs: Not on file  ?Physical Activity: Not on file  ?Stress: Not on file  ?Social Connections: Not on file  ?Intimate Partner Violence: Not on file  ? ? ? ?Constitutional: Denies  fever, malaise, fatigue, headache or abrupt weight changes.  ?HEENT: Denies eye pain, eye redness, ear pain, ringing in the ears, wax buildup, runny nose, nasal congestion, bloody nose, or sore throat. ?Respiratory: Denies difficulty breathing, shortness of breath, cough or sputum production.   ?Cardiovascular: Denies chest pain, chest tightness, palpitations or swelling in the hands or feet.  ?Gastrointestinal: Denies abdominal pain, bloating, constipation, diarrhea or blood in the stool.  ?GU: Patient reports erectile dysfunction.  Denies urgency, frequency, pain with urination, burning sensation, blood in urine, odor or discharge. ?Musculoskeletal: Denies decrease in range of motion, difficulty with gait, muscle pain or joint pain and swelling.  ?Skin:  Patient reports rash.  Denies redness, lesions or ulcercations.  ?Neurological: Patient reports insomnia.  Denies dizziness, difficulty with memory, difficulty with speech or problems with balance and coordination.  ?Psych: Patient has a history of anxiety.  Denies depression, SI/HI. ? ?No other specific complaints in a complete review of systems (except as listed in HPI above). ? ?Objective:  ? Physical Exam ? ? ?BP 130/81 (BP Location: Right Arm, Patient Position: Sitting, Cuff Size: Large)   Pulse 64   Temp (!) 97.1 ?F (36.2 ?C) (Temporal)   Ht 5' 10"  (1.778 m)   Wt 215 lb (97.5 kg)   SpO2 97%   BMI 30.85 kg/m?  ? ?Wt Readings from Last 3 Encounters:  ?04/21/21 217 lb 6.4 oz (98.6 kg)  ?01/13/21 212 lb (96.2 kg)  ?10/28/20 208 lb (94.3 kg)  ? ? ?General: Appears his stated age, obese, in NAD. ?Skin: Warm, dry and intact.  Bandage to nose status post Mohs procedure, sutures to left anterior shin. ?HEENT: Head: normal shape and size; Eyes: sclera white and EOMs intact;  ?Neck:  Neck supple, trachea midline. No masses, lumps or thyromegaly present.  ?Cardiovascular: Normal rate and rhythm. S1,S2 noted.  No murmur, rubs or gallops noted. No JVD or BLE edema. No carotid bruits noted. ?Pulmonary/Chest: Normal effort and positive vesicular breath sounds. No respiratory distress. No wheezes, rales or ronchi noted.  ?Abdomen: Soft and nontender. Normal bowel sounds.  ?Musculoskeletal: Strength 5/5 BUE/BLE.  No difficulty with gait.  ?Neurological: Alert and oriented. Cranial nerves II-XII grossly intact. Coordination normal.  ?Psychiatric: Mood and affect normal. Behavior is normal. Judgment and thought content normal.  ? ? ?BMET ?   ?Component Value Date/Time  ? NA 137 10/28/2020 1306  ? K 3.8 10/28/2020 1306  ? CL 103 10/28/2020 1306  ? CO2 28 10/28/2020 1306  ? GLUCOSE 82 10/28/2020 1306  ? BUN 12 10/28/2020 1306  ? CREATININE 1.08 10/28/2020 1306  ? CALCIUM 9.5 10/28/2020 1306  ? GFRNONAA 59 (L) 09/12/2017 0355   ? GFRAA >60 09/12/2017 0355  ? ? ?Lipid Panel  ?   ?Component Value Date/Time  ? CHOL 156 12/17/2019 1124  ? TRIG 97.0 12/17/2019 1124  ? HDL 42.30 12/17/2019 1124  ? CHOLHDL 4 12/17/2019 1124  ? VLDL 19.4 12/17/2019 1124  ? [place] 94 12/17/2019 1124  ? ? ?CBC ?   ?Component Value Date/Time  ? WBC 6.2 09/29/2020 1042  ? RBC 4.50 09/29/2020 1042  ? HGB 14.7 09/29/2020 1042  ? HCT 41.2 09/29/2020 1042  ? PLT 227 09/29/2020 1042  ? MCV 91.6 09/29/2020 1042  ? MCH 32.7 09/29/2020 1042  ? MCHC 35.7 09/29/2020 1042  ? RDW 13.0 09/29/2020 1042  ? LYMPHSABS 1.7 09/10/2017 0612  ? MONOABS 0.6 09/10/2017 0612  ? EOSABS 0.2 09/10/2017 0612  ? BASOSABS 0.1  09/10/2017 0612  ? ? ?Hgb A1C ?Lab Results  ?Component Value Date  ? HGBA1C 5.4 12/17/2019  ? ? ? ? ? ? ?   ?Assessment & Plan:  ? ?Preventative Health Maintenance: ? ?He declines flu shot ?He declines tetanus booster ?He declines Pneumovax and Prevnar ?He declines Shingrix ?He declines COVID-vaccine ?He declines referral to GI for screening colonoscopy ?Encouraged him to consume a balanced diet and exercise regimen ?Advised him to see an eye doctor and dentist annually ?We will check CBC, c-Met, lipid, A1c, PSA and hep C today ? ?RTC in 6 months, follow-up chronic conditions ?[name] Silversmith, NP ?This visit occurred during the SARS-CoV-2 public health emergency.  Safety protocols were in place, including screening questions prior to the visit, additional usage of staff PPE, and extensive cleaning of exam room while observing appropriate contact time as indicated for disinfecting solutions.  ? ?

## 2021-12-15 LAB — CBC
HCT: 41.5 % (ref 38.5–50.0)
Hemoglobin: 14.1 g/dL (ref 13.2–17.1)
MCH: 31.9 pg (ref 27.0–33.0)
MCHC: 34 g/dL (ref 32.0–36.0)
MCV: 93.9 fL (ref 80.0–100.0)
MPV: 9.7 fL (ref 7.5–12.5)
Platelets: 249 10*3/uL (ref 140–400)
RBC: 4.42 10*6/uL (ref 4.20–5.80)
RDW: 13.1 % (ref 11.0–15.0)
WBC: 4.3 10*3/uL (ref 3.8–10.8)

## 2021-12-15 LAB — COMPLETE METABOLIC PANEL WITH GFR
AG Ratio: 1.7 (calc) (ref 1.0–2.5)
ALT: 24 U/L (ref 9–46)
AST: 23 U/L (ref 10–35)
Albumin: 4.4 g/dL (ref 3.6–5.1)
Alkaline phosphatase (APISO): 50 U/L (ref 35–144)
BUN: 11 mg/dL (ref 7–25)
CO2: 27 mmol/L (ref 20–32)
Calcium: 9.6 mg/dL (ref 8.6–10.3)
Chloride: 103 mmol/L (ref 98–110)
Creat: 1.12 mg/dL (ref 0.70–1.35)
Globulin: 2.6 g/dL (calc) (ref 1.9–3.7)
Glucose, Bld: 104 mg/dL (ref 65–139)
Potassium: 3.9 mmol/L (ref 3.5–5.3)
Sodium: 138 mmol/L (ref 135–146)
Total Bilirubin: 0.5 mg/dL (ref 0.2–1.2)
Total Protein: 7 g/dL (ref 6.1–8.1)
eGFR: 72 mL/min/{1.73_m2} (ref >=60)

## 2021-12-15 LAB — PSA: PSA: 4.59 ng/mL — ABNORMAL HIGH (ref <=4.00)

## 2021-12-15 LAB — HEMOGLOBIN A1C
Hgb A1c MFr Bld: 5.6 % of total Hgb (ref <5.7)
Mean Plasma Glucose: 114 mg/dL
eAG (mmol/L): 6.3 mmol/L

## 2021-12-15 LAB — LIPID PANEL
Cholesterol: 151 mg/dL (ref <200)
HDL: 42 mg/dL (ref >=40)
LDL Cholesterol (Calc): 78 mg/dL (calc)
Non-HDL Cholesterol (Calc): 109 mg/dL (calc) (ref <130)
Total CHOL/HDL Ratio: 3.6 (calc) (ref <5.0)
Triglycerides: 217 mg/dL — ABNORMAL HIGH (ref <150)

## 2021-12-15 LAB — TEST AUTHORIZATION: TEST CODE:: 7600

## 2021-12-15 LAB — HEPATITIS C ANTIBODY
Hepatitis C Ab: NONREACTIVE
SIGNAL TO CUT-OFF: <0.02 (ref <1.00)

## 2021-12-18 ENCOUNTER — Telehealth: Payer: Self-pay

## 2021-12-18 NOTE — Telephone Encounter (Signed)
Copied from [place] [id number]. Topic: General - Other ?>> Dec 15, 2021  4:58 PM Peter Rose A wrote: ?Reason for CRM: The patient would like to speak with a member of staff when possible about their recent labs  ? ?The patient has concerns related to their PSA and Triglycerides  ? ?Please contact further when possible ?

## 2021-12-18 NOTE — Telephone Encounter (Signed)
Pt advised of lab results.   Thanks,   -Bentlee Benningfield  

## 2022-01-30 IMAGING — MR MR LUMBAR SPINE W/O CM
4 of 5 series · 27 of 48 positions shown · non-contrast
Comparison: MRI of the lumbar spine November 27, 2013.

CLINICAL DATA: Lumbar radiculopathy. Low back pain, progressive
neurological deficit.

EXAM:
MRI LUMBAR SPINE WITHOUT CONTRAST
TECHNIQUE: Multiplanar, multisequence MR imaging of the lumbar spine was
performed. No intravenous contrast was administered.

[Series 2: T2 · sagittal · 4.0mm · 1.09mm/px · 6 of 17 slices shown (1 of 2)]
[im 1/17]
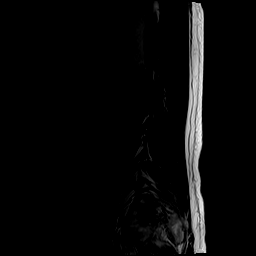
[im 4/17]
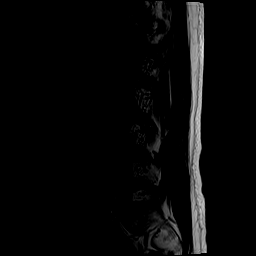
[im 7/17]
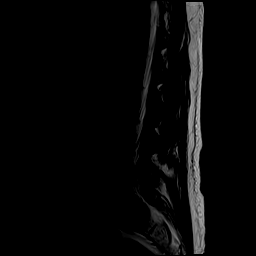
[im 10/17]
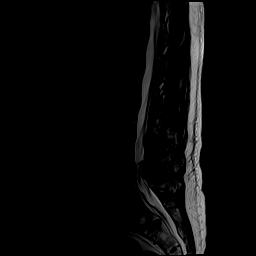
[im 13/17]
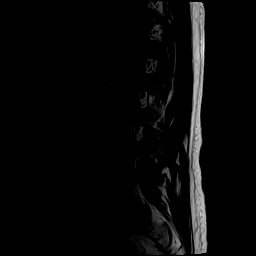
[im 17/17]
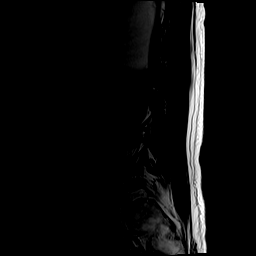

[Series 4: T1 · sagittal · 4.0mm · 1.09mm/px · 6 of 17 slices shown (1 of 2)]
[im 1/17]
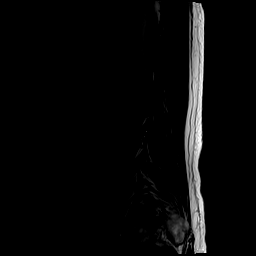
[im 4/17]
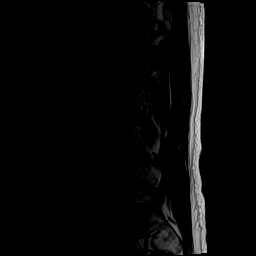
[im 7/17]
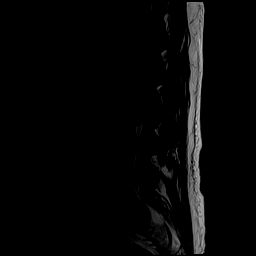
[im 10/17]
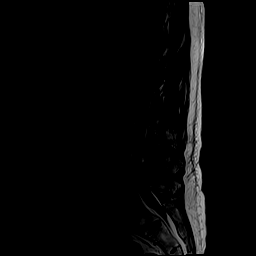
[im 13/17]
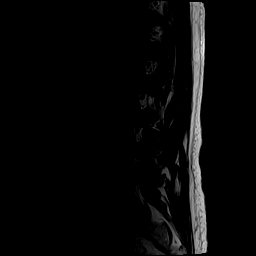
[im 17/17]
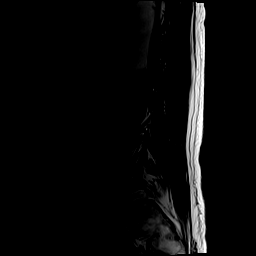

[Series 7: T2 · axial · 4.0mm · 0.39mm/px · z∈[-149,+69]mm · 9 of 42 slices shown (2 of 2)]
[im 1/42]
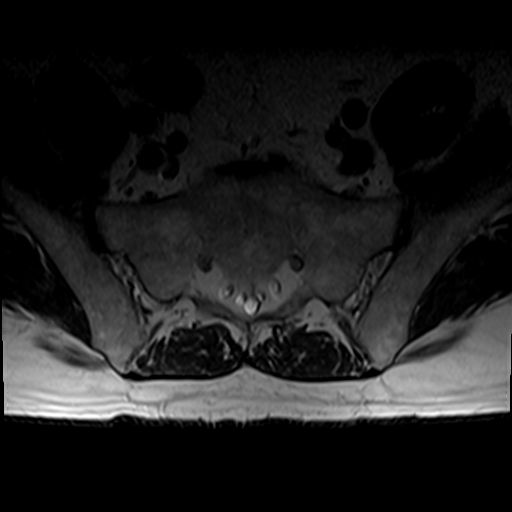
[im 6/42]
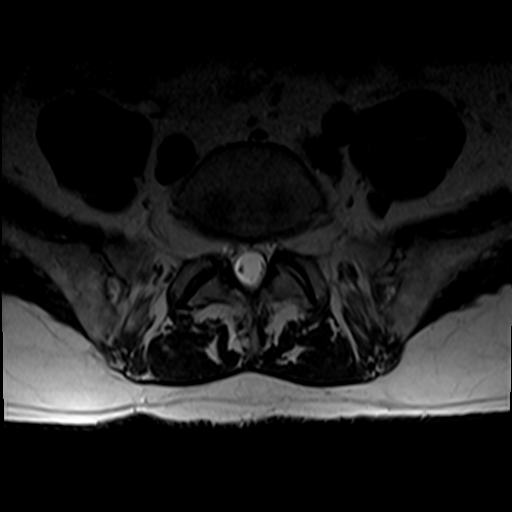
[im 12/42]
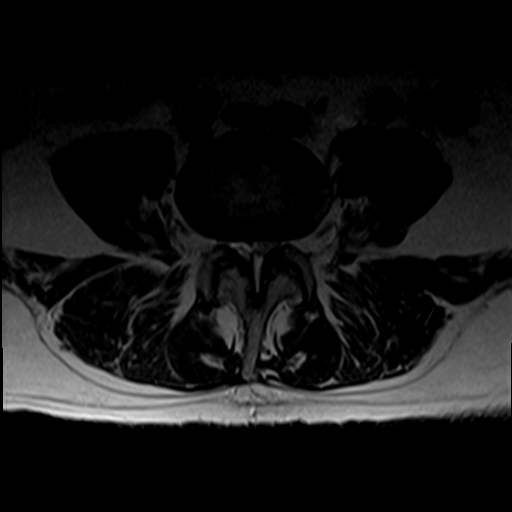
[im 18/42]
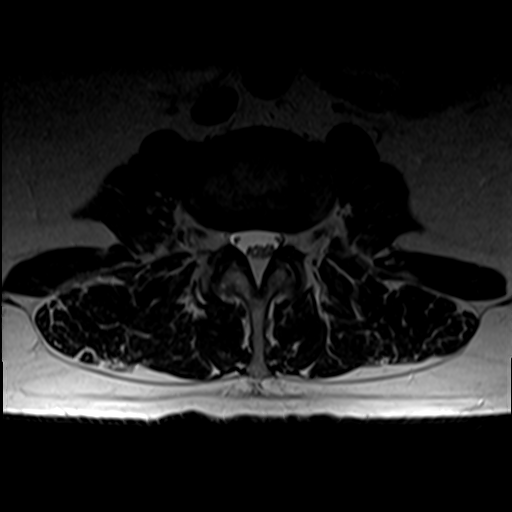
[im 21/42]
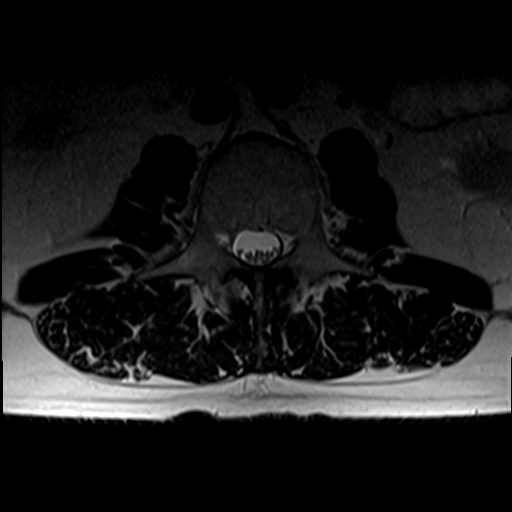
[im 24/42]
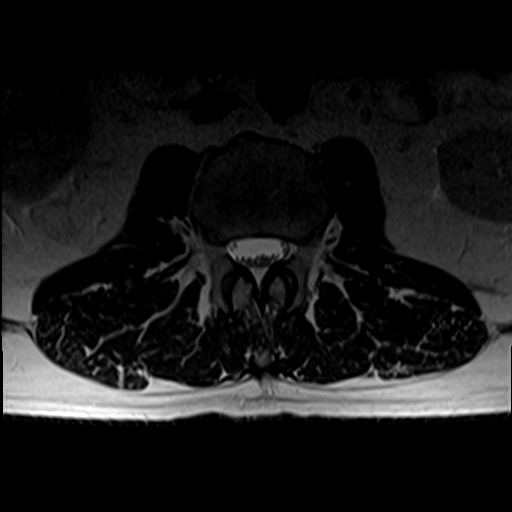
[im 30/42]
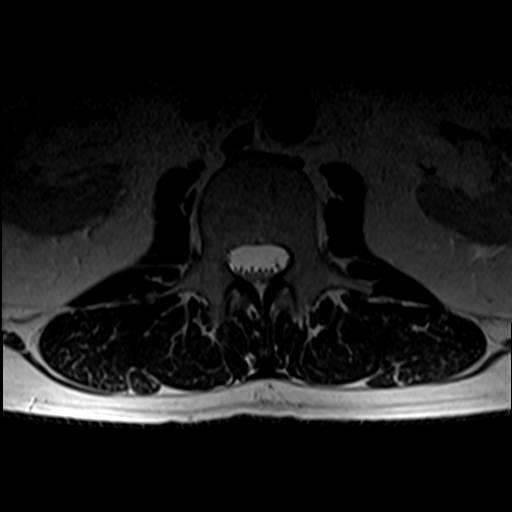
[im 36/42]
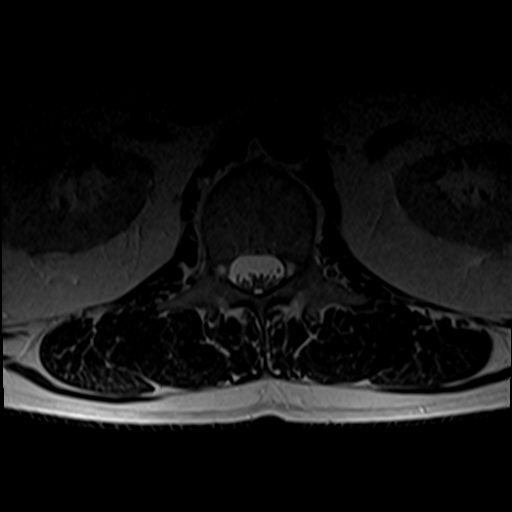
[im 42/42]
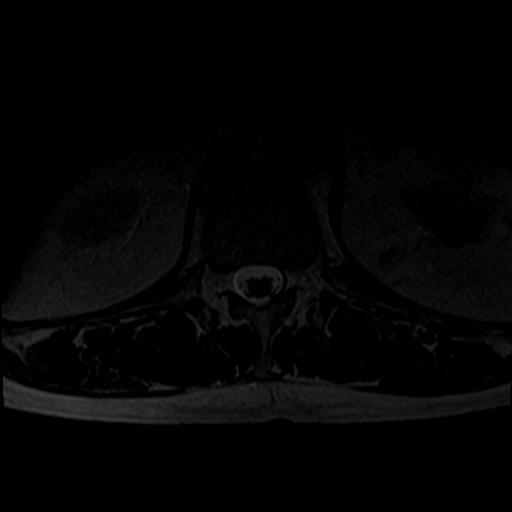

[Series 8: T1 · axial · 4.0mm · 0.39mm/px · z∈[-149,+40]mm · 6 of 42 slices shown (2 of 2)]
[im 1/42]
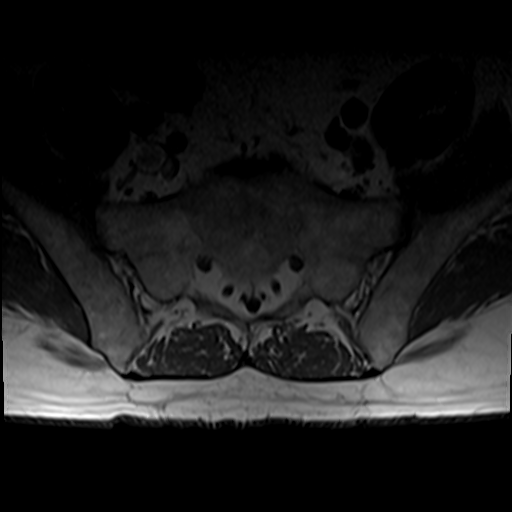
[im 6/42]
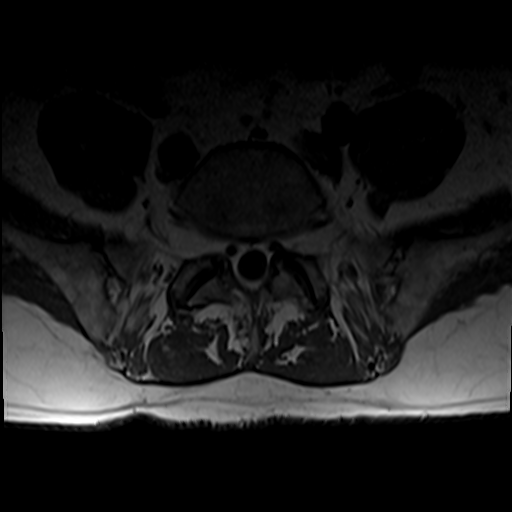
[im 12/42]
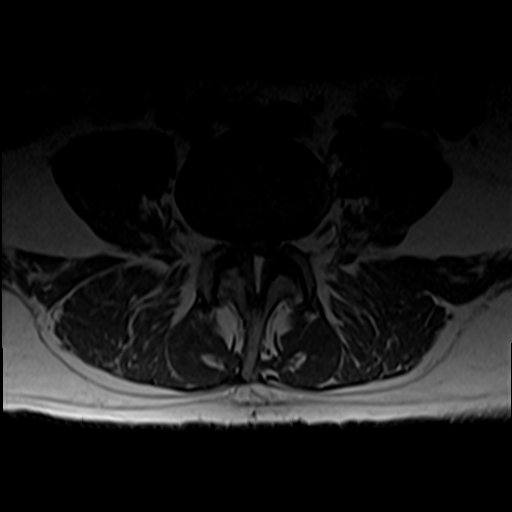
[im 18/42]
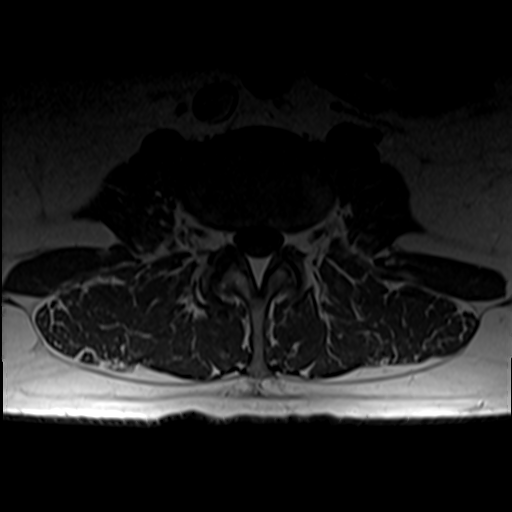
[im 21/42]
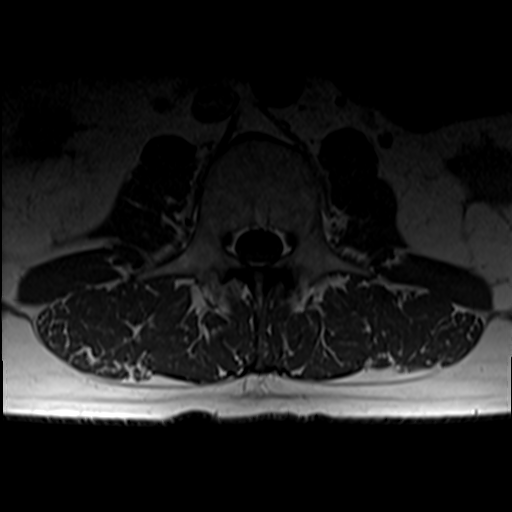
[im 36/42]
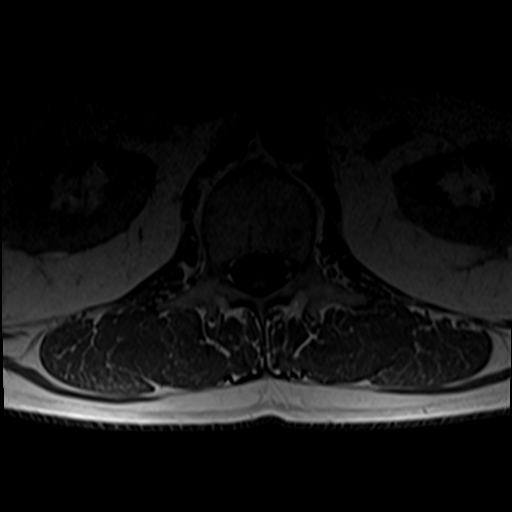

[27 of 48 positions shown; findings below may reference images not displayed]

FINDINGS: Segmentation:  Standard.

Alignment:  Physiologic.

Vertebrae:  No fracture, evidence of discitis, or bone lesion.

Conus medullaris and cauda equina: Conus extends to the L1-2 level.
Conus and cauda equina appear normal.

Paraspinal and other soft tissues: Negative.

Disc levels:

T12-L1: Shallow disc bulge. No spinal canal or neural foraminal
stenosis.

L1-2: No spinal canal or neural foraminal stenosis.

L2-3: No spinal canal or neural foraminal stenosis.

L3-4: Mild facet degenerative changes. No spinal canal or neural
foraminal stenosis.

L4-5: Disc bulge, prominent facet degenerative changes and
ligamentum flavum redundancy with associated synovial cyst
projecting anteriorly from the right facet joint. Findings result in
severe spinal canal stenosis with effacement of the right
subarticular zone and impingement on the traversing right L5 nerve
root. Spinal canal stenosis is significantly progressed since prior
MRI. No significant neural foraminal narrowing.

L5-S1: Moderate facet degenerative changes. No spinal canal or
neural foraminal stenosis.
IMPRESSION: L4-L5 disc bulge, prominent facet degenerative changes and
ligamentum flavum redundancy with associated synovial cyst
projecting anteriorly from the right facet joint resulting in severe
spinal canal stenosis with effacement of the right subarticular zone
and impingement on the traversing right L5 nerve root. Findings are
new since prior MRI.

## 2022-03-19 ENCOUNTER — Telehealth: Payer: Self-pay

## 2022-03-19 NOTE — Telephone Encounter (Signed)
Left a message for patient to call back and schedule their Medicare Annual Wellness Visit (AWV) virtually, telephone or face to face. ?  ?Peter Rose, CMA ?[phone]  ?

## 2022-05-08 ENCOUNTER — Other Ambulatory Visit: Payer: Self-pay | Admitting: Internal Medicine

## 2022-05-08 NOTE — Telephone Encounter (Signed)
Requested medication (s) are due for refill today - no  Requested medication (s) are on the active medication list -yes  Future visit scheduled -no  Last refill: 05/08/22  Notes to clinic: non delegated Rx- duplicate- pharmacy request changes 90 day supply  Requested Prescriptions  Pending Prescriptions Disp Refills   methotrexate (RHEUMATREX) 2.5 MG tablet [Pharmacy Med Name: METHOTREXATE 2.5MG TABLETS - YELLOW] 128 tablet     Sig: TAKE 10 TABLETS BY MOUTH ONCE A WEEK WHEN NEEDED FOR PSORIASIS FLARE     Not Delegated - Immunology: Immunosuppressive Agents - methotrexate Failed - 05/08/2022  2:10 PM      Failed - This refill cannot be delegated      Failed - ALT in normal range and within 90 days    ALT  Date Value Ref Range Status  12/14/2021 24 9 - 46 U/L Final         Failed - AST in normal range and within 90 days    AST  Date Value Ref Range Status  12/14/2021 23 10 - 35 U/L Final         Failed - Cr in normal range and within 90 days    Creat  Date Value Ref Range Status  12/14/2021 1.12 0.70 - 1.35 mg/dL Final         Failed - HCT in normal range and within 90 days    HCT  Date Value Ref Range Status  12/14/2021 41.5 38.5 - 50.0 % Final         Failed - HGB in normal range and within 90 days    Hemoglobin  Date Value Ref Range Status  12/14/2021 14.1 13.2 - 17.1 g/dL Final         Failed - PLT in normal range and within 90 days    Platelets  Date Value Ref Range Status  12/14/2021 249 140 - 400 Thousand/uL Final         Failed - WBC in normal range and within 90 days    WBC  Date Value Ref Range Status  12/14/2021 4.3 3.8 - 10.8 Thousand/uL Final         Failed - Albumin in normal range and within 360 days    Albumin  Date Value Ref Range Status  12/17/2019 4.4 3.5 - 5.2 g/dL Final         Failed - eGFR is 10 or above and within 90 days    GFR calc Af Amer  Date Value Ref Range Status  09/12/2017 >60 >60 mL/min Final    Comment:     (NOTE) The eGFR has been calculated using the CKD EPI equation. This calculation has not been validated in all clinical situations. eGFR's persistently <60 mL/min signify possible Chronic Kidney Disease.    GFR calc non Af Amer  Date Value Ref Range Status  09/12/2017 59 (L) >60 mL/min Final   GFR  Date Value Ref Range Status  10/28/2020 72.21 >60.00 mL/min Final    Comment:    Calculated using the CKD-EPI Creatinine Equation (2021)   eGFR  Date Value Ref Range Status  12/14/2021 72 > OR = 60 mL/min/1.71m Final    Comment:    The eGFR is based on the CKD-EPI 2021 equation. To calculate  the new eGFR from a previous Creatinine or Cystatin C result, go to https://www.kidney.org/professionals/ kdoqi/gfr%5Fcalculator          Failed - Valid encounter within last 3 months  Recent Outpatient Visits           4 months ago Encounter for general adult medical examination with abnormal findings   [facility] [place], Coralie Keens, NP   1 year ago Stage 3a chronic kidney disease [facility])   [place], NP              Passed - Patient is not pregnant         Requested Prescriptions  Pending Prescriptions Disp Refills   methotrexate (RHEUMATREX) 2.5 MG tablet [Pharmacy Med Name: METHOTREXATE 2.5MG TABLETS - YELLOW] 128 tablet     Sig: TAKE 10 TABLETS BY MOUTH ONCE A WEEK WHEN NEEDED FOR PSORIASIS FLARE     Not Delegated - Immunology: Immunosuppressive Agents - methotrexate Failed - 05/08/2022  2:10 PM      Failed - This refill cannot be delegated      Failed - ALT in normal range and within 90 days    ALT  Date Value Ref Range Status  12/14/2021 24 9 - 46 U/L Final         Failed - AST in normal range and within 90 days    AST  Date Value Ref Range Status  12/14/2021 23 10 - 35 U/L Final         Failed - Cr in normal range and within 90 days    Creat  Date Value Ref Range Status  12/14/2021 1.12 0.70 - 1.35 mg/dL  Final         Failed - HCT in normal range and within 90 days    HCT  Date Value Ref Range Status  12/14/2021 41.5 38.5 - 50.0 % Final         Failed - HGB in normal range and within 90 days    Hemoglobin  Date Value Ref Range Status  12/14/2021 14.1 13.2 - 17.1 g/dL Final         Failed - PLT in normal range and within 90 days    Platelets  Date Value Ref Range Status  12/14/2021 249 140 - 400 Thousand/uL Final         Failed - WBC in normal range and within 90 days    WBC  Date Value Ref Range Status  12/14/2021 4.3 3.8 - 10.8 Thousand/uL Final         Failed - Albumin in normal range and within 360 days    Albumin  Date Value Ref Range Status  12/17/2019 4.4 3.5 - 5.2 g/dL Final         Failed - eGFR is 10 or above and within 90 days    GFR calc Af Amer  Date Value Ref Range Status  09/12/2017 >60 >60 mL/min Final    Comment:    (NOTE) The eGFR has been calculated using the CKD EPI equation. This calculation has not been validated in all clinical situations. eGFR's persistently <60 mL/min signify possible Chronic Kidney Disease.    GFR calc non Af Amer  Date Value Ref Range Status  09/12/2017 59 (L) >60 mL/min Final   GFR  Date Value Ref Range Status  10/28/2020 72.21 >60.00 mL/min Final    Comment:    Calculated using the CKD-EPI Creatinine Equation (2021)   eGFR  Date Value Ref Range Status  12/14/2021 72 > OR = 60 mL/min/1.43m Final    Comment:    The eGFR is based on the CKD-EPI 2021 equation.  To calculate  the new eGFR from a previous Creatinine or Cystatin C result, go to https://www.kidney.org/professionals/ kdoqi/gfr%5Fcalculator          Failed - Valid encounter within last 3 months    Recent Outpatient Visits           4 months ago Encounter for general adult medical examination with abnormal findings   [facility] [place], Coralie Keens, NP   1 year ago Stage 3a chronic kidney disease [facility])   [place], NP              Passed - Patient is not pregnant

## 2022-05-08 NOTE — Telephone Encounter (Signed)
Requested medication (s) are due for refill today - yes  Requested medication (s) are on the active medication list -yes  Future visit scheduled -no  Last refill: 12/14/21 #90 1RF  Notes to clinic: non delegated Rx, fails lab protocol, visit protocol  Requested Prescriptions  Pending Prescriptions Disp Refills   methotrexate (RHEUMATREX) 2.5 MG tablet [Pharmacy Med Name: METHOTREXATE 2.5MG TABLETS - YELLOW] 90 tablet 1    Sig: TAKE 10 TABLETS BY MOUTH ONCE A WEEK WHEN NEEDED FOR PSORIASIS FLARE     Not Delegated - Immunology: Immunosuppressive Agents - methotrexate Failed - 05/08/2022  3:13 AM      Failed - This refill cannot be delegated      Failed - ALT in normal range and within 90 days    ALT  Date Value Ref Range Status  12/14/2021 24 9 - 46 U/L Final         Failed - AST in normal range and within 90 days    AST  Date Value Ref Range Status  12/14/2021 23 10 - 35 U/L Final         Failed - Cr in normal range and within 90 days    Creat  Date Value Ref Range Status  12/14/2021 1.12 0.70 - 1.35 mg/dL Final         Failed - HCT in normal range and within 90 days    HCT  Date Value Ref Range Status  12/14/2021 41.5 38.5 - 50.0 % Final         Failed - HGB in normal range and within 90 days    Hemoglobin  Date Value Ref Range Status  12/14/2021 14.1 13.2 - 17.1 g/dL Final         Failed - PLT in normal range and within 90 days    Platelets  Date Value Ref Range Status  12/14/2021 249 140 - 400 Thousand/uL Final         Failed - WBC in normal range and within 90 days    WBC  Date Value Ref Range Status  12/14/2021 4.3 3.8 - 10.8 Thousand/uL Final         Failed - Albumin in normal range and within 360 days    Albumin  Date Value Ref Range Status  12/17/2019 4.4 3.5 - 5.2 g/dL Final         Failed - eGFR is 10 or above and within 90 days    GFR calc Af Amer  Date Value Ref Range Status  09/12/2017 >60 >60 mL/min Final    Comment:    (NOTE) The  eGFR has been calculated using the CKD EPI equation. This calculation has not been validated in all clinical situations. eGFR's persistently <60 mL/min signify possible Chronic Kidney Disease.    GFR calc non Af Amer  Date Value Ref Range Status  09/12/2017 59 (L) >60 mL/min Final   GFR  Date Value Ref Range Status  10/28/2020 72.21 >60.00 mL/min Final    Comment:    Calculated using the CKD-EPI Creatinine Equation (2021)   eGFR  Date Value Ref Range Status  12/14/2021 72 > OR = 60 mL/min/1.58m Final    Comment:    The eGFR is based on the CKD-EPI 2021 equation. To calculate  the new eGFR from a previous Creatinine or Cystatin C result, go to https://www.kidney.org/professionals/ kdoqi/gfr%5Fcalculator          Failed - Valid encounter within last 3 months  Recent Outpatient Visits           4 months ago Encounter for general adult medical examination with abnormal findings   [facility] [place], Coralie Keens, NP   1 year ago Stage 3a chronic kidney disease [facility])   [place], NP              Passed - Patient is not pregnant         Requested Prescriptions  Pending Prescriptions Disp Refills   methotrexate (RHEUMATREX) 2.5 MG tablet [Pharmacy Med Name: METHOTREXATE 2.5MG TABLETS - YELLOW] 90 tablet 1    Sig: TAKE 10 TABLETS BY MOUTH ONCE A WEEK WHEN NEEDED FOR PSORIASIS FLARE     Not Delegated - Immunology: Immunosuppressive Agents - methotrexate Failed - 05/08/2022  3:13 AM      Failed - This refill cannot be delegated      Failed - ALT in normal range and within 90 days    ALT  Date Value Ref Range Status  12/14/2021 24 9 - 46 U/L Final         Failed - AST in normal range and within 90 days    AST  Date Value Ref Range Status  12/14/2021 23 10 - 35 U/L Final         Failed - Cr in normal range and within 90 days    Creat  Date Value Ref Range Status  12/14/2021 1.12 0.70 - 1.35 mg/dL Final          Failed - HCT in normal range and within 90 days    HCT  Date Value Ref Range Status  12/14/2021 41.5 38.5 - 50.0 % Final         Failed - HGB in normal range and within 90 days    Hemoglobin  Date Value Ref Range Status  12/14/2021 14.1 13.2 - 17.1 g/dL Final         Failed - PLT in normal range and within 90 days    Platelets  Date Value Ref Range Status  12/14/2021 249 140 - 400 Thousand/uL Final         Failed - WBC in normal range and within 90 days    WBC  Date Value Ref Range Status  12/14/2021 4.3 3.8 - 10.8 Thousand/uL Final         Failed - Albumin in normal range and within 360 days    Albumin  Date Value Ref Range Status  12/17/2019 4.4 3.5 - 5.2 g/dL Final         Failed - eGFR is 10 or above and within 90 days    GFR calc Af Amer  Date Value Ref Range Status  09/12/2017 >60 >60 mL/min Final    Comment:    (NOTE) The eGFR has been calculated using the CKD EPI equation. This calculation has not been validated in all clinical situations. eGFR's persistently <60 mL/min signify possible Chronic Kidney Disease.    GFR calc non Af Amer  Date Value Ref Range Status  09/12/2017 59 (L) >60 mL/min Final   GFR  Date Value Ref Range Status  10/28/2020 72.21 >60.00 mL/min Final    Comment:    Calculated using the CKD-EPI Creatinine Equation (2021)   eGFR  Date Value Ref Range Status  12/14/2021 72 > OR = 60 mL/min/1.40m Final    Comment:    The eGFR is based on the CKD-EPI 2021 equation.  To calculate  the new eGFR from a previous Creatinine or Cystatin C result, go to https://www.kidney.org/professionals/ kdoqi/gfr%5Fcalculator          Failed - Valid encounter within last 3 months    Recent Outpatient Visits           4 months ago Encounter for general adult medical examination with abnormal findings   [facility] [place], Coralie Keens, NP   1 year ago Stage 3a chronic kidney disease [facility])   [place],  NP              Passed - Patient is not pregnant

## 2022-05-09 ENCOUNTER — Other Ambulatory Visit: Payer: Self-pay | Admitting: Internal Medicine

## 2022-05-10 NOTE — Telephone Encounter (Signed)
Requested medication (s) are due for refill today:   Provider to review  Requested medication (s) are on the active medication list:   Yes  Future visit scheduled:   No   Seen 4 months ago   Last ordered: 05/09/2022 #128, 0 refills  Returned because it's a non delegated refill.   All labs due per protocol.   A 90 day supply being requested.   Requested Prescriptions  Pending Prescriptions Disp Refills   methotrexate (RHEUMATREX) 2.5 MG tablet [Pharmacy Med Name: METHOTREXATE 2.5MG TABLETS - YELLOW] 129 tablet     Sig: TAKE 10 TABLET BY MOUTH ONCE A WEEK WHEN NEEDED FOR PSORIASIS FLARE     Not Delegated - Immunology: Immunosuppressive Agents - methotrexate Failed - 05/09/2022 10:26 AM      Failed - This refill cannot be delegated      Failed - ALT in normal range and within 90 days    ALT  Date Value Ref Range Status  12/14/2021 24 9 - 46 U/L Final         Failed - AST in normal range and within 90 days    AST  Date Value Ref Range Status  12/14/2021 23 10 - 35 U/L Final         Failed - Cr in normal range and within 90 days    Creat  Date Value Ref Range Status  12/14/2021 1.12 0.70 - 1.35 mg/dL Final         Failed - HCT in normal range and within 90 days    HCT  Date Value Ref Range Status  12/14/2021 41.5 38.5 - 50.0 % Final         Failed - HGB in normal range and within 90 days    Hemoglobin  Date Value Ref Range Status  12/14/2021 14.1 13.2 - 17.1 g/dL Final         Failed - PLT in normal range and within 90 days    Platelets  Date Value Ref Range Status  12/14/2021 249 140 - 400 Thousand/uL Final         Failed - WBC in normal range and within 90 days    WBC  Date Value Ref Range Status  12/14/2021 4.3 3.8 - 10.8 Thousand/uL Final         Failed - Albumin in normal range and within 360 days    Albumin  Date Value Ref Range Status  12/17/2019 4.4 3.5 - 5.2 g/dL Final         Failed - eGFR is 10 or above and within 90 days    GFR calc Af Amer   Date Value Ref Range Status  09/12/2017 >60 >60 mL/min Final    Comment:    (NOTE) The eGFR has been calculated using the CKD EPI equation. This calculation has not been validated in all clinical situations. eGFR's persistently <60 mL/min signify possible Chronic Kidney Disease.    GFR calc non Af Amer  Date Value Ref Range Status  09/12/2017 59 (L) >60 mL/min Final   GFR  Date Value Ref Range Status  10/28/2020 72.21 >60.00 mL/min Final    Comment:    Calculated using the CKD-EPI Creatinine Equation (2021)   eGFR  Date Value Ref Range Status  12/14/2021 72 > OR = 60 mL/min/1.46m Final    Comment:    The eGFR is based on the CKD-EPI 2021 equation. To calculate  the new eGFR from a previous Creatinine or Cystatin  C result, go to https://www.kidney.org/professionals/ kdoqi/gfr%5Fcalculator          Failed - Valid encounter within last 3 months    Recent Outpatient Visits           4 months ago Encounter for general adult medical examination with abnormal findings   [facility] [place], Coralie Keens, NP   1 year ago Stage 3a chronic kidney disease [facility])   [place], NP              Passed - Patient is not pregnant

## 2022-06-03 ENCOUNTER — Other Ambulatory Visit: Payer: Self-pay | Admitting: Internal Medicine

## 2022-06-03 DIAGNOSIS — Z0001 Encounter for general adult medical examination with abnormal findings: Secondary | ICD-10-CM

## 2022-06-05 NOTE — Telephone Encounter (Signed)
Requested Prescriptions  Pending Prescriptions Disp Refills  . traZODone (DESYREL) 100 MG tablet [Pharmacy Med Name: TRAZODONE 100MG TABLETS] 90 tablet 0    Sig: TAKE 1 TABLET(100 MG) BY MOUTH AT BEDTIME     Psychiatry: Antidepressants - Serotonin Modulator Passed - 06/03/2022 11:12 AM      Passed - Valid encounter within last 6 months    Recent Outpatient Visits          5 months ago Encounter for general adult medical examination with abnormal findings   [facility] [place], Coralie Keens, NP   1 year ago Stage 3a chronic kidney disease [facility])   [facility] [place], Coralie Keens, NP             . levocetirizine (XYZAL) 5 MG tablet [Pharmacy Med Name: LEVOCETIRIZINE 5MG TABLETS] 90 tablet 1    Sig: TAKE 1 TABLET BY MOUTH AT BEDTIME AS NEEDED FOR ALLERGIES     Ear, Nose, and Throat:  Antihistamines - levocetirizine dihydrochloride Passed - 06/03/2022 11:12 AM      Passed - Cr in normal range and within 360 days    Creat  Date Value Ref Range Status  12/14/2021 1.12 0.70 - 1.35 mg/dL Final         Passed - eGFR is 10 or above and within 360 days    GFR calc Af Amer  Date Value Ref Range Status  09/12/2017 >60 >60 mL/min Final    Comment:    (NOTE) The eGFR has been calculated using the CKD EPI equation. This calculation has not been validated in all clinical situations. eGFR's persistently <60 mL/min signify possible Chronic Kidney Disease.    GFR calc non Af Amer  Date Value Ref Range Status  09/12/2017 59 (L) >60 mL/min Final   GFR  Date Value Ref Range Status  10/28/2020 72.21 >60.00 mL/min Final    Comment:    Calculated using the CKD-EPI Creatinine Equation (2021)   eGFR  Date Value Ref Range Status  12/14/2021 72 > OR = 60 mL/min/1.64m Final    Comment:    The eGFR is based on the CKD-EPI 2021 equation. To calculate  the new eGFR from a previous Creatinine or Cystatin C result, go to  https://www.kidney.org/professionals/ kdoqi/gfr%5Fcalculator          Passed - Valid encounter within last 12 months    Recent Outpatient Visits          5 months ago Encounter for general adult medical examination with abnormal findings   [facility]   1 year ago Stage 3a chronic kidney disease ([facility]

## 2022-06-08 DIAGNOSIS — D2261 Melanocytic nevi of right upper limb, including shoulder: Secondary | ICD-10-CM | POA: Diagnosis not present

## 2022-06-08 DIAGNOSIS — C44612 Basal cell carcinoma of skin of right upper limb, including shoulder: Secondary | ICD-10-CM | POA: Diagnosis not present

## 2022-06-08 DIAGNOSIS — D485 Neoplasm of uncertain behavior of skin: Secondary | ICD-10-CM | POA: Diagnosis not present

## 2022-06-08 DIAGNOSIS — D2262 Melanocytic nevi of left upper limb, including shoulder: Secondary | ICD-10-CM | POA: Diagnosis not present

## 2022-06-08 DIAGNOSIS — Z85820 Personal history of malignant melanoma of skin: Secondary | ICD-10-CM | POA: Diagnosis not present

## 2022-06-08 DIAGNOSIS — D2272 Melanocytic nevi of left lower limb, including hip: Secondary | ICD-10-CM | POA: Diagnosis not present

## 2022-06-08 DIAGNOSIS — L400 Psoriasis vulgaris: Secondary | ICD-10-CM | POA: Diagnosis not present

## 2022-07-04 ENCOUNTER — Other Ambulatory Visit: Payer: Self-pay | Admitting: Internal Medicine

## 2022-07-04 NOTE — Telephone Encounter (Signed)
Requested Prescriptions  Pending Prescriptions Disp Refills  . lisinopril-hydrochlorothiazide (ZESTORETIC) 10-12.5 MG tablet [Pharmacy Med Name: LISINOPRIL-HCTZ 10/12.5MG TABLETS] 45 tablet 0    Sig: TAKE 1/2 TABLET BY MOUTH DAILY     Cardiovascular:  ACEI + Diuretic Combos Failed - 07/04/2022  7:57 AM      Failed - Na in normal range and within 180 days    Sodium  Date Value Ref Range Status  12/14/2021 138 135 - 146 mmol/L Final         Failed - K in normal range and within 180 days    Potassium  Date Value Ref Range Status  12/14/2021 3.9 3.5 - 5.3 mmol/L Final         Failed - Cr in normal range and within 180 days    Creat  Date Value Ref Range Status  12/14/2021 1.12 0.70 - 1.35 mg/dL Final         Failed - eGFR is 30 or above and within 180 days    GFR calc Af Amer  Date Value Ref Range Status  09/12/2017 >60 >60 mL/min Final    Comment:    (NOTE) The eGFR has been calculated using the CKD EPI equation. This calculation has not been validated in all clinical situations. eGFR's persistently <60 mL/min signify possible Chronic Kidney Disease.    GFR calc non Af Amer  Date Value Ref Range Status  09/12/2017 59 (L) >60 mL/min Final   GFR  Date Value Ref Range Status  10/28/2020 72.21 >60.00 mL/min Final    Comment:    Calculated using the CKD-EPI Creatinine Equation (2021)   eGFR  Date Value Ref Range Status  12/14/2021 72 > OR = 60 mL/min/1.61m Final    Comment:    The eGFR is based on the CKD-EPI 2021 equation. To calculate  the new eGFR from a previous Creatinine or Cystatin C result, go to https://www.kidney.org/professionals/ kdoqi/gfr%5Fcalculator          Failed - Valid encounter within last 6 months    Recent Outpatient Visits          6 months ago Encounter for general adult medical examination with abnormal findings   [facility]   1 year ago Stage 3a chronic kidney disease ([facility]   [place]            Passed - Patient is not pregnant      Passed - Last BP in normal range    BP Readings from Last 1 Encounters:  12/14/21 130/81

## 2022-07-06 DIAGNOSIS — C44612 Basal cell carcinoma of skin of right upper limb, including shoulder: Secondary | ICD-10-CM | POA: Diagnosis not present

## 2022-07-09 ENCOUNTER — Ambulatory Visit: Payer: Self-pay

## 2022-07-09 NOTE — Patient Outreach (Signed)
  Care Coordination   Initial Visit Note   07/09/2022 Name: Peter Rose MRN: [id number] DOB: [birth year]  Peter Rose is a 66 y.o. year old male who sees Ba[place], Coralie Keens, NP for primary care. I spoke with  Peter Rose by phone today.  What matters to the patients health and wellness today?  Saw dermatologist recently for an area on the patients arm that was scraped. Feels he is doing well with managing his health and well being    Goals Addressed             This Visit's Progress    COMPLETED: RNCM: Effective Management of HTN and health and well being       Care Coordination Interventions:  BP Readings from Last 3 Encounters:  12/14/21 130/81  04/21/21 123/73  01/13/21 136/82    Evaluation of current treatment plan related to hypertension self management and patient's adherence to plan as established by provider Provided education to patient re: stroke prevention, s/s of heart attack and stroke Reviewed medications with patient and discussed importance of compliance Discussed plans with patient for ongoing care management follow up and provided patient with direct contact information for care management team Advised patient to discuss changes in HTN or chronic conditions  with provider Provided education on prescribed diet heart healthy Discussed complications of poorly controlled blood pressure such as heart disease, stroke, circulatory complications, vision complications, kidney impairment, sexual dysfunction Screening for signs and symptoms of depression related to chronic disease state  Assessed social determinant of health barriers Review with the patient the goals of the care coordination program and how to reach the [facility]. The patient denies any needs at this time. Feels his health is well managed and he denies any acute findings. Did see the dermatologist recently for a lesion that was scraped off and other than  that is doing well. Knows how to reach the [facility] for  changes, concerns, questions, or new needs.           SDOH assessments and interventions completed:  Yes  SDOH Interventions Today    Flowsheet Row Most Recent Value  SDOH Interventions   Food Insecurity Interventions Intervention Not Indicated  Housing Interventions Intervention Not Indicated  Transportation Interventions Intervention Not Indicated  Utilities Interventions Intervention Not Indicated  Financial Strain Interventions Intervention Not Indicated  Stress Interventions Intervention Not Indicated        Care Coordination Interventions Activated:  Yes  Care Coordination Interventions:  Yes, provided   Follow up plan: No further intervention required.   Encounter Outcome:  Pt. Visit Completed   Noreene Larsson RN, MSN, [place]  Mobile: [phone]

## 2022-07-09 NOTE — Patient Instructions (Signed)
Visit Information  Thank you for taking time to visit with me today. Please don't hesitate to contact me if I can be of assistance to you.   Following are the goals we discussed today:   Goals Addressed             This Visit's Progress    COMPLETED: RNCM: Effective Management of HTN and health and well being       Care Coordination Interventions:  BP Readings from Last 3 Encounters:  12/14/21 130/81  04/21/21 123/73  01/13/21 136/82    Evaluation of current treatment plan related to hypertension self management and patient's adherence to plan as established by provider Provided education to patient re: stroke prevention, s/s of heart attack and stroke Reviewed medications with patient and discussed importance of compliance Discussed plans with patient for ongoing care management follow up and provided patient with direct contact information for care management team Advised patient to discuss changes in HTN or chronic conditions  with provider Provided education on prescribed diet heart healthy Discussed complications of poorly controlled blood pressure such as heart disease, stroke, circulatory complications, vision complications, kidney impairment, sexual dysfunction Screening for signs and symptoms of depression related to chronic disease state  Assessed social determinant of health barriers Review with the patient the goals of the care coordination program and how to reach the [facility]. The patient denies any needs at this time. Feels his health is well managed and he denies any acute findings. Did see the dermatologist recently for a lesion that was scraped off and other than  that is doing well. Knows how to reach the [facility] for changes, concerns, questions, or new needs.             Please call the care guide team at [phone] if you need to schedule an appointment.   If you are experiencing a Mental Health or [place] or need someone to talk to, please  call the Suicide and Crisis Lifeline: 988 call the [place] National Suicide Prevention Lifeline: 713 061 6850 or TTY: 6676409797 TTY 313-019-5162) to talk to a trained counselor call 1-800-273-TALK (toll free, 24 hour hotline)  Patient verbalizes understanding of instructions and care plan provided today and agrees to view in [place]. Active MyChart status and patient understanding of how to access instructions and care plan via MyChart confirmed with patient.     No further follow up required: the patient is stable and denies any needs at this time. Review of how to reach [facility] for changes, new needs, concerns, or educational needs.  Noreene Larsson RN, MSN, [facility]  Mobile: [phone]

## 2022-09-17 ENCOUNTER — Other Ambulatory Visit: Payer: Self-pay | Admitting: Internal Medicine

## 2022-09-17 DIAGNOSIS — Z0001 Encounter for general adult medical examination with abnormal findings: Secondary | ICD-10-CM

## 2022-09-17 NOTE — Telephone Encounter (Signed)
Patient called, left VM to return the call to the office to scheduled an appt for medication refill request.   

## 2022-09-17 NOTE — Telephone Encounter (Signed)
Requested medication (s) are due for refill today: yes  Requested medication (s) are on the active medication list: yes  Last refill:  06/05/22 #90/0  Future visit scheduled: no  Notes to clinic:  .rx     Requested Prescriptions  Pending Prescriptions Disp Refills   traZODone (DESYREL) 100 MG tablet [Pharmacy Med Name: TRAZODONE '100MG'$  TABLETS] 90 tablet 0    Sig: TAKE 1 TABLET(100 MG) BY MOUTH AT BEDTIME     Psychiatry: Antidepressants - Serotonin Modulator Failed - 09/17/2022  3:13 AM      Failed - Valid encounter within last 6 months    Recent Outpatient Visits           9 months ago Encounter for general adult medical examination with abnormal findings   [facility] [place], Coralie Keens, NP   1 year ago Stage 3a chronic kidney disease [facility])   [facility] [place], Coralie Keens, NP

## 2022-09-21 ENCOUNTER — Encounter: Payer: Self-pay | Admitting: Internal Medicine

## 2022-09-21 ENCOUNTER — Ambulatory Visit (INDEPENDENT_AMBULATORY_CARE_PROVIDER_SITE_OTHER): Payer: Medicare HMO | Admitting: Internal Medicine

## 2022-09-21 VITALS — BP 136/84 | HR 67 | Temp 96.9°F | Ht 70.0 in | Wt 214.0 lb

## 2022-09-21 DIAGNOSIS — E6609 Other obesity due to excess calories: Secondary | ICD-10-CM

## 2022-09-21 DIAGNOSIS — F419 Anxiety disorder, unspecified: Secondary | ICD-10-CM | POA: Diagnosis not present

## 2022-09-21 DIAGNOSIS — N522 Drug-induced erectile dysfunction: Secondary | ICD-10-CM | POA: Diagnosis not present

## 2022-09-21 DIAGNOSIS — I6523 Occlusion and stenosis of bilateral carotid arteries: Secondary | ICD-10-CM | POA: Diagnosis not present

## 2022-09-21 DIAGNOSIS — L409 Psoriasis, unspecified: Secondary | ICD-10-CM

## 2022-09-21 DIAGNOSIS — I10 Essential (primary) hypertension: Secondary | ICD-10-CM | POA: Diagnosis not present

## 2022-09-21 DIAGNOSIS — F5101 Primary insomnia: Secondary | ICD-10-CM | POA: Diagnosis not present

## 2022-09-21 DIAGNOSIS — R7309 Other abnormal glucose: Secondary | ICD-10-CM

## 2022-09-21 DIAGNOSIS — M4726 Other spondylosis with radiculopathy, lumbar region: Secondary | ICD-10-CM | POA: Diagnosis not present

## 2022-09-21 DIAGNOSIS — Z6830 Body mass index (BMI) 30.0-30.9, adult: Secondary | ICD-10-CM

## 2022-09-21 DIAGNOSIS — Z0001 Encounter for general adult medical examination with abnormal findings: Secondary | ICD-10-CM | POA: Diagnosis not present

## 2022-09-21 MED ORDER — TRAZODONE HCL 100 MG PO TABS: 200.0000 mg | ORAL_TABLET | Freq: Every day | ORAL | 1 refills | Status: DC

## 2022-09-21 MED ORDER — ASPIRIN 81 MG PO TBEC: 81.0000 mg | DELAYED_RELEASE_TABLET | Freq: Every day | ORAL | 12 refills | Status: AC

## 2022-09-21 NOTE — Progress Notes (Signed)
Subjective:    Patient ID: Peter Rose, male    DOB: [birth year], 66 y.o.   MRN: [id number]  HPI  Patient presents the clinic today for follow-up of chronic conditions.  HTN: His BP today is 136/84.  He is taking Lisinopril HCT as prescribed.  ECG from 09/2020 reviewed.  HLD with Carotid Atherosclerosis: His last LDL was 78, triglycerides 217, 12/2021.  He is not taking any cholesterol-lowering medication at this time.  He does not consume low-fat diet.  Lumbar Radiculopathy: Currently not an issue.  He is not taking any medications for this at this time.  He does not follow with neurosurgery.  Psoriasis: Managed on Methotrexate as needed.  He follows with dermatology.  Anxiety: Currently not an issue.  He is not taking any medications or seeing a therapist at this time.  He denies depression, SI/HI.  Insomnia: He has difficulty staying asleep.  He takes Trazodone as prescribed.  There is no sleep study on file.  ED: Managed with Sildenafil as needed.  He does not follow with urology.  Review of Systems  Past Medical History:  Diagnosis Date   Complication of anesthesia    abdominal distention (brief ileus ves opioid induced constipation) following 09/10/17 knee arthroscopy   COVID    GERD (gastroesophageal reflux disease)    occasional   Medical history non-contributory    Psoriasis     Current Outpatient Medications  Medication Sig Dispense Refill   ibuprofen (ADVIL) 200 MG tablet Take 600-800 mg by mouth every 6 (six) hours as needed for moderate pain.     levocetirizine (XYZAL) 5 MG tablet TAKE 1 TABLET BY MOUTH AT BEDTIME AS NEEDED FOR ALLERGIES 90 tablet 1   lisinopril-hydrochlorothiazide (ZESTORETIC) 10-12.5 MG tablet TAKE 1/2 TABLET BY MOUTH DAILY 45 tablet 0   methotrexate (RHEUMATREX) 2.5 MG tablet TAKE 10 TABLETS BY MOUTH ONCE A WEEK WHEN NEEDED FOR PSORIASIS FLARE 128 tablet 0   sildenafil (VIAGRA) 100 MG tablet TAKE 1 TABLET BY MOUTH AS NEEDED FOR ED. 10  tablet 3   traZODone (DESYREL) 100 MG tablet TAKE 1 TABLET(100 MG) BY MOUTH AT BEDTIME 30 tablet 0   No current facility-administered medications for this visit.    Allergies  Allergen Reactions   Codeine Other (See Comments)    feels wired and jittery   Hycodan [Hydrocodone Bit-Homatrop Mbr]     Makes pt feel weird/jittery   Vicodin [Hydrocodone-Acetaminophen] Other (See Comments)    Pt feels "wired" with pain meds    Family History  Problem Relation Age of Onset   Colon cancer Neg Hx    Prostate cancer Neg Hx     Social History   Socioeconomic History   Marital status: Single    Spouse name: Not on file   Number of children: 3   Years of education: 55   Highest education level: Not on file  Occupational History   Occupation: [employer]  Tobacco Use   Smoking status: Former    Packs/day: 1.00    Types: Cigarettes    Quit date: 01/06/2017    Years since quitting: 5.7   Smokeless tobacco: Never  Vaping Use   Vaping Use: Never used  Substance and Sexual Activity   Alcohol use: Yes    Comment: occasional   Drug use: No   Sexual activity: Not on file  Other Topics Concern   Not on file  Social History Narrative   Lives alone   Caffeine use:  2  cups - coffee   Tea daily   Social Determinants of Health   Financial Resource Strain: Low Risk  (07/09/2022)   Overall Financial Resource Strain (CARDIA)    Difficulty of Paying Living Expenses: Not hard at all  Food Insecurity: Unknown (07/09/2022)   Hunger Vital Sign    Worried About Running Out of Food in the Last Year: Not on file    Ran Out of Food in the Last Year: Never true  Transportation Needs: No Transportation Needs (07/09/2022)   PRAPARE - Hydrologist (Medical): No    Lack of Transportation (Non-Medical): No  Physical Activity: Not on file  Stress: No Stress Concern Present (07/09/2022)   [place]    Feeling  of Stress : Not at all  Social Connections: Not on file  Intimate Partner Violence: Not At Risk (07/09/2022)   Humiliation, Afraid, Rape, and Kick questionnaire    Fear of Current or Ex-Partner: No    Emotionally Abused: No    Physically Abused: No    Sexually Abused: No     Constitutional: Denies fever, malaise, fatigue, headache or abrupt weight changes.  HEENT: Denies eye pain, eye redness, ear pain, ringing in the ears, wax buildup, runny nose, nasal congestion, bloody nose, or sore throat. Respiratory: Denies difficulty breathing, shortness of breath, cough or sputum production.   Cardiovascular: Denies chest pain, chest tightness, palpitations or swelling in the hands or feet.  Gastrointestinal: Denies abdominal pain, bloating, constipation, diarrhea or blood in the stool.  GU: Patient reports erectile dysfunction.  Denies urgency, frequency, pain with urination, burning sensation, blood in urine, odor or discharge. Musculoskeletal: Denies decrease in range of motion, difficulty with gait, muscle pain or joint pain and swelling.  Skin: Patient reports rash.  Denies redness, lesions or ulcercations.  Neurological: Patient reports insomnia.  Denies dizziness, difficulty with memory, difficulty with speech or problems with balance and coordination.  Psych: Patient has a history of anxiety.  Denies depression, SI/HI.  No other specific complaints in a complete review of systems (except as listed in HPI above).     Objective:   Physical Exam  BP 136/84 (BP Location: Right Arm, Patient Position: Sitting, Cuff Size: Large)   Pulse 67   Temp (!) 96.9 F (36.1 C) (Temporal)   Ht '5\' 10"'$  (1.778 m)   Wt 214 lb (97.1 kg)   SpO2 99%   BMI 30.71 kg/m   Wt Readings from Last 3 Encounters:  12/14/21 215 lb (97.5 kg)  04/21/21 217 lb 6.4 oz (98.6 kg)  01/13/21 212 lb (96.2 kg)    General: Appears his stated age, obese in NAD. Skin: Warm, dry and intact.  HEENT: Head: normal shape and  size; Eyes: sclera white, no icterus, conjunctiva pink, PERRLA and EOMs intact;  Cardiovascular: Normal rate and rhythm. S1,S2 noted.  No murmur, rubs or gallops noted. No JVD or BLE edema. No carotid bruits noted. Pulmonary/Chest: Normal effort and positive vesicular breath sounds. No respiratory distress. No wheezes, rales or ronchi noted.  Abdomen: Normal bowel sounds. Musculoskeletal:  No difficulty with gait.  Neurological: Alert and oriented.  Coordination normal.  Psychiatric: Mood and affect normal. Behavior is normal. Judgment and thought content normal.     BMET    Component Value Date/Time   NA 138 12/14/2021 0827   K 3.9 12/14/2021 0827   CL 103 12/14/2021 0827   CO2 27 12/14/2021 0827  GLUCOSE 104 12/14/2021 0827   BUN 11 12/14/2021 0827   CREATININE 1.12 12/14/2021 0827   CALCIUM 9.6 12/14/2021 0827   GFRNONAA 59 (L) 09/12/2017 0355   GFRAA >60 09/12/2017 0355    Lipid Panel     Component Value Date/Time   CHOL 151 12/14/2021 0827   TRIG 217 (H) 12/14/2021 0827   HDL 42 12/14/2021 0827   CHOLHDL 3.6 12/14/2021 0827   VLDL 19.4 12/17/2019 1124   LDLCALC 78 12/14/2021 0827    CBC    Component Value Date/Time   WBC 4.3 12/14/2021 0827   RBC 4.42 12/14/2021 0827   HGB 14.1 12/14/2021 0827   HCT 41.5 12/14/2021 0827   PLT 249 12/14/2021 0827   MCV 93.9 12/14/2021 0827   MCH 31.9 12/14/2021 0827   MCHC 34.0 12/14/2021 0827   RDW 13.1 12/14/2021 0827   LYMPHSABS 1.7 09/10/2017 0612   MONOABS 0.6 09/10/2017 0612   EOSABS 0.2 09/10/2017 0612   BASOSABS 0.1 09/10/2017 0612    Hgb A1C Lab Results  Component Value Date   HGBA1C 5.6 12/14/2021           Assessment & Plan:     RTC in 6 months for your annual exam [name] Silversmith, NP

## 2022-09-21 NOTE — Assessment & Plan Note (Signed)
Currently not an issue 

## 2022-09-21 NOTE — Assessment & Plan Note (Signed)
He is resistant to increasing lisinopril HCT to 1 tab daily due to previous hypotension Reinforced DASH diet and exercise for weight loss C-Met today

## 2022-09-21 NOTE — Assessment & Plan Note (Signed)
Continue methotrexate as needed

## 2022-09-21 NOTE — Assessment & Plan Note (Signed)
Continue trazodone 

## 2022-09-21 NOTE — Assessment & Plan Note (Signed)
Encourage diet and exercise for weight loss 

## 2022-09-21 NOTE — Assessment & Plan Note (Signed)
Continue sildenafil as needed. 

## 2022-09-21 NOTE — Assessment & Plan Note (Signed)
Encourage regular stretching and core strengthening

## 2022-09-21 NOTE — Patient Instructions (Signed)

## 2022-09-21 NOTE — Assessment & Plan Note (Signed)
Lipid profile today We will have him start baby aspirin

## 2022-09-22 LAB — COMPLETE METABOLIC PANEL WITH GFR
AG Ratio: 1.4 (calc) (ref 1.0–2.5)
ALT: 23 U/L (ref 9–46)
AST: 25 U/L (ref 10–35)
Albumin: 4.6 g/dL (ref 3.6–5.1)
Alkaline phosphatase (APISO): 54 U/L (ref 35–144)
BUN: 15 mg/dL (ref 7–25)
CO2: 27 mmol/L (ref 20–32)
Calcium: 10 mg/dL (ref 8.6–10.3)
Chloride: 102 mmol/L (ref 98–110)
Creat: 1.11 mg/dL (ref 0.70–1.35)
Globulin: 3.2 g/dL (calc) (ref 1.9–3.7)
Glucose, Bld: 103 mg/dL — ABNORMAL HIGH (ref 65–99)
Potassium: 4.7 mmol/L (ref 3.5–5.3)
Sodium: 138 mmol/L (ref 135–146)
Total Bilirubin: 0.4 mg/dL (ref 0.2–1.2)
Total Protein: 7.8 g/dL (ref 6.1–8.1)
eGFR: 73 mL/min/{1.73_m2} (ref >=60)

## 2022-09-22 LAB — LIPID PANEL
Cholesterol: 177 mg/dL (ref <200)
HDL: 48 mg/dL (ref >=40)
LDL Cholesterol (Calc): 105 mg/dL (calc) — ABNORMAL HIGH
Non-HDL Cholesterol (Calc): 129 mg/dL (calc) (ref <130)
Total CHOL/HDL Ratio: 3.7 (calc) (ref <5.0)
Triglycerides: 147 mg/dL (ref <150)

## 2022-09-22 LAB — HEMOGLOBIN A1C
Hgb A1c MFr Bld: 5.7 % of total Hgb — ABNORMAL HIGH (ref <5.7)
Mean Plasma Glucose: 117 mg/dL
eAG (mmol/L): 6.5 mmol/L

## 2022-09-22 LAB — CBC
HCT: 47 % (ref 38.5–50.0)
Hemoglobin: 16.5 g/dL (ref 13.2–17.1)
MCH: 32.9 pg (ref 27.0–33.0)
MCHC: 35.1 g/dL (ref 32.0–36.0)
MCV: 93.6 fL (ref 80.0–100.0)
MPV: 9.5 fL (ref 7.5–12.5)
Platelets: 248 10*3/uL (ref 140–400)
RBC: 5.02 10*6/uL (ref 4.20–5.80)
RDW: 13.5 % (ref 11.0–15.0)
WBC: 6.2 10*3/uL (ref 3.8–10.8)

## 2022-09-28 ENCOUNTER — Ambulatory Visit: Payer: Self-pay | Admitting: *Deleted

## 2022-09-28 NOTE — Telephone Encounter (Signed)
Opened chart to answer question for agent.    Question was whether the aspirin 81 mg was an rx or OTC.     I let her know it is OTC per the order.   She's letting the pt. Know.   This is a new rx for her.

## 2022-10-10 ENCOUNTER — Ambulatory Visit: Payer: Self-pay

## 2022-10-10 ENCOUNTER — Telehealth: Payer: Medicare HMO | Admitting: Physician Assistant

## 2022-10-10 DIAGNOSIS — J019 Acute sinusitis, unspecified: Secondary | ICD-10-CM

## 2022-10-10 DIAGNOSIS — B9689 Other specified bacterial agents as the cause of diseases classified elsewhere: Secondary | ICD-10-CM

## 2022-10-10 MED ORDER — AMOXICILLIN-POT CLAVULANATE 875-125 MG PO TABS: 1.0000 | ORAL_TABLET | Freq: Two times a day (BID) | ORAL | 0 refills | Status: DC

## 2022-10-10 MED ORDER — BENZONATATE 100 MG PO CAPS: 100.0000 mg | ORAL_CAPSULE | Freq: Three times a day (TID) | ORAL | 0 refills | Status: DC | PRN

## 2022-10-10 NOTE — Telephone Encounter (Signed)
Chief Complaint: Headache, cough, sore throat Symptoms: sinus drainage Frequency: Onset Saturday Pertinent Negatives: Patient denies SOB Disposition: '[]'$ ED /'[]'$ Urgent Care (no appt availability in office) / '[]'$ Appointment(In office/virtual)/ '[x]'$  [facility]/ '[]'$ Home Care/ '[]'$ Refused Recommended Disposition /'[]'$ [facility]/ '[]'$  Follow-up with PCP Additional Notes: COVID negative test. No availability in the office, scheduled virtual UC visit.    Summary: headache, cough, sore throat   Patient called in requesting Z pack has, headache, cough, sore throat         Reason for Disposition  SEVERE coughing spells (e.g., whooping sound after coughing, vomiting after coughing)  Answer Assessment - Initial Assessment Questions 1. ONSET: "When did the cough begin?"      Since Satruday 2. SEVERITY: "How bad is the cough today?"      7/10 3. SPUTUM: "Describe the color of your sputum" (none, dry cough; clear, white, yellow, green)     Green, white color, yellowish 4. HEMOPTYSIS: "Are you coughing up any blood?" If so ask: "How much?" (flecks, streaks, tablespoons, etc.)     No 5. DIFFICULTY BREATHING: "Are you having difficulty breathing?" If Yes, ask: "How bad is it?" (e.g., mild, moderate, severe)    - MILD: No SOB at rest, mild SOB with walking, speaks normally in sentences, can lie down, no retractions, pulse < 100.    - MODERATE: SOB at rest, SOB with minimal exertion and prefers to sit, cannot lie down flat, speaks in phrases, mild retractions, audible wheezing, pulse 100-120.    - SEVERE: Very SOB at rest, speaks in single words, struggling to breathe, sitting hunched forward, retractions, pulse > 120      No 6. FEVER: "Do you have a fever?" If Yes, ask: "What is your temperature, how was it measured, and when did it start?"     Not checked 7. LUNG HISTORY: "Do you have any history of lung disease?"  (e.g., pulmonary embolus, asthma, emphysema)     No 10. OTHER SYMPTOMS:  "Do you have any other symptoms?" (e.g., runny nose, wheezing, chest pain)      Sinus drainage  Protocols used: Cough - Acute Productive-A-AH

## 2022-10-10 NOTE — Patient Instructions (Signed)
[name], thank you for joining Mar Daring, PA-C for today's virtual visit.  While this provider is not your primary care provider (PCP), if your PCP is located in our provider database this encounter information will be shared with them immediately following your visit.   [facility] account gives you access to today's visit and all your visits, tests, and labs performed at [facility] " click here if you don't have a [facility] account or go to [email or web address]  Consent: (Patient) [name] provided verbal consent for this virtual visit at the beginning of the encounter.  Current Medications:  Current Outpatient Medications:    amoxicillin-clavulanate (AUGMENTIN) 875-125 MG tablet, Take 1 tablet by mouth 2 (two) times daily., Disp: 14 tablet, Rfl: 0   benzonatate (TESSALON) 100 MG capsule, Take 1 capsule (100 mg total) by mouth 3 (three) times daily as needed., Disp: 30 capsule, Rfl: 0   aspirin EC 81 MG tablet, Take 1 tablet (81 mg total) by mouth daily. Swallow whole., Disp: 30 tablet, Rfl: 12   ibuprofen (ADVIL) 200 MG tablet, Take 600-800 mg by mouth every 6 (six) hours as needed for moderate pain., Disp: , Rfl:    levocetirizine (XYZAL) 5 MG tablet, TAKE 1 TABLET BY MOUTH AT BEDTIME AS NEEDED FOR ALLERGIES, Disp: 90 tablet, Rfl: 1   lisinopril-hydrochlorothiazide (ZESTORETIC) 10-12.5 MG tablet, TAKE 1/2 TABLET BY MOUTH DAILY, Disp: 45 tablet, Rfl: 0   methotrexate (RHEUMATREX) 2.5 MG tablet, TAKE 10 TABLETS BY MOUTH ONCE A WEEK WHEN NEEDED FOR PSORIASIS FLARE, Disp: 128 tablet, Rfl: 0   sildenafil (VIAGRA) 100 MG tablet, TAKE 1 TABLET BY MOUTH AS NEEDED FOR ED., Disp: 10 tablet, Rfl: 3   traZODone (DESYREL) 100 MG tablet, Take 2 tablets (200 mg total) by mouth at bedtime., Disp: 180 tablet, Rfl: 1   Medications ordered in this encounter:  Meds ordered this encounter  Medications   amoxicillin-clavulanate (AUGMENTIN) 875-125  MG tablet    Sig: Take 1 tablet by mouth 2 (two) times daily.    Dispense:  14 tablet    Refill:  0    Order Specific Question:   Supervising Provider    Answer:   Chase Picket [[id number]]   benzonatate (TESSALON) 100 MG capsule    Sig: Take 1 capsule (100 mg total) by mouth 3 (three) times daily as needed.    Dispense:  30 capsule    Refill:  0    Order Specific Question:   Supervising Provider    Answer:   Chase Picket [id number]     *If you need refills on other medications prior to your next appointment, please contact your pharmacy*  Follow-Up: Call back or seek an in-person evaluation if the symptoms worsen or if the condition fails to improve as anticipated.  [place] [phone]  Other Instructions  Sinus Infection, Adult A sinus infection, also called sinusitis, is inflammation of your sinuses. Sinuses are hollow spaces in the bones around your face. Your sinuses are located: Around your eyes. In the middle of your forehead. Behind your nose. In your cheekbones. Mucus normally drains out of your sinuses. When your nasal tissues become inflamed or swollen, mucus can become trapped or blocked. This allows bacteria, viruses, and fungi to grow, which leads to infection. Most infections of the sinuses are caused by a virus. A sinus infection can develop quickly. It can last for up to 4 weeks (acute) or for  more than 12 weeks (chronic). A sinus infection often develops after a cold. What are the causes? This condition is caused by anything that creates swelling in the sinuses or stops mucus from draining. This includes: Allergies. Asthma. Infection from bacteria or viruses. Deformities or blockages in your nose or sinuses. Abnormal growths in the nose (nasal polyps). Pollutants, such as chemicals or irritants in the air. Infection from fungi. This is rare. What increases the risk? You are more likely to develop this condition if you: Have a weak  body defense system (immune system). Do a lot of swimming or diving. Overuse nasal sprays. Smoke. What are the signs or symptoms? The main symptoms of this condition are pain and a feeling of pressure around the affected sinuses. Other symptoms include: Stuffy nose or congestion that makes it difficult to breathe through your nose. Thick yellow or greenish drainage from your nose. Tenderness, swelling, and warmth over the affected sinuses. A cough that may get worse at night. Decreased sense of smell and taste. Extra mucus that collects in the throat or the back of the nose (postnasal drip) causing a sore throat or bad breath. Tiredness (fatigue). Fever. How is this diagnosed? This condition is diagnosed based on: Your symptoms. Your medical history. A physical exam. Tests to find out if your condition is acute or chronic. This may include: Checking your nose for nasal polyps. Viewing your sinuses using a device that has a light (endoscope). Testing for allergies or bacteria. Imaging tests, such as an MRI or CT scan. In rare cases, a bone biopsy may be done to rule out more serious types of fungal sinus disease. How is this treated? Treatment for a sinus infection depends on the cause and whether your condition is chronic or acute. If caused by a virus, your symptoms should go away on their own within 10 days. You may be given medicines to relieve symptoms. They include: Medicines that shrink swollen nasal passages (decongestants). A spray that eases inflammation of the nostrils (topical intranasal corticosteroids). Rinses that help get rid of thick mucus in your nose (nasal saline washes). Medicines that treat allergies (antihistamines). Over-the-counter pain relievers. If caused by bacteria, your health care provider may recommend waiting to see if your symptoms improve. Most bacterial infections will get better without antibiotic medicine. You may be given antibiotics if you  have: A severe infection. A weak immune system. If caused by narrow nasal passages or nasal polyps, surgery may be needed. Follow these instructions at home: Medicines Take, use, or apply over-the-counter and prescription medicines only as told by your health care provider. These may include nasal sprays. If you were prescribed an antibiotic medicine, take it as told by your health care provider. Do not stop taking the antibiotic even if you start to feel better. Hydrate and humidify  Drink enough fluid to keep your urine pale yellow. Staying hydrated will help to thin your mucus. Use a cool mist humidifier to keep the humidity level in your home above 50%. Inhale steam for 10-15 minutes, 3-4 times a day, or as told by your health care provider. You can do this in the bathroom while a hot shower is running. Limit your exposure to cool or dry air. Rest Rest as much as possible. Sleep with your head raised (elevated). Make sure you get enough sleep each night. General instructions  Apply a warm, moist washcloth to your face 3-4 times a day or as told by your health care provider. This will  help with discomfort. Use nasal saline washes as often as told by your health care provider. Wash your hands often with soap and water to reduce your exposure to germs. If soap and water are not available, use hand sanitizer. Do not smoke. Avoid being around people who are smoking (secondhand smoke). Keep all follow-up visits. This is important. Contact a health care provider if: You have a fever. Your symptoms get worse. Your symptoms do not improve within 10 days. Get help right away if: You have a severe headache. You have persistent vomiting. You have severe pain or swelling around your face or eyes. You have vision problems. You develop confusion. Your neck is stiff. You have trouble breathing. These symptoms may be an emergency. Get help right away. Call 911. Do not wait to see if the  symptoms will go away. Do not drive yourself to the hospital. Summary A sinus infection is soreness and inflammation of your sinuses. Sinuses are hollow spaces in the bones around your face. This condition is caused by nasal tissues that become inflamed or swollen. The swelling traps or blocks the flow of mucus. This allows bacteria, viruses, and fungi to grow, which leads to infection. If you were prescribed an antibiotic medicine, take it as told by your health care provider. Do not stop taking the antibiotic even if you start to feel better. Keep all follow-up visits. This is important. This information is not intended to replace advice given to you by your health care provider. Make sure you discuss any questions you have with your health care provider. Document Revised: 09/05/2021 Document Reviewed: 09/05/2021 Elsevier Patient Education  [place].    If you have been instructed to have an in-person evaluation today at a local Urgent Care facility, please use the link below. It will take you to a list of all of our available [facility], including address, phone number and hours of operation. Please do not delay care.  [facility]  If you or a family member do not have a primary care provider, use the link below to schedule a visit and establish care. When you choose a [facility] primary care physician or advanced practice provider, you gain a long-term partner in health. Find a Primary Care Provider  Learn more about [facility]'s in-office and virtual care options: [facility]

## 2022-10-10 NOTE — Progress Notes (Signed)
Virtual Visit Consent   Peter Rose, you are scheduled for a virtual visit with a [facility] provider today. Just as with appointments in the office, your consent must be obtained to participate. Your consent will be active for this visit and any virtual visit you may have with one of our providers in the next 365 days. If you have a MyChart account, a copy of this consent can be sent to you electronically.  As this is a virtual visit, video technology does not allow for your provider to perform a traditional examination. This may limit your provider's ability to fully assess your condition. If your provider identifies any concerns that need to be evaluated in person or the need to arrange testing (such as labs, EKG, etc.), we will make arrangements to do so. Although advances in technology are sophisticated, we cannot ensure that it will always work on either your end or our end. If the connection with a video visit is poor, the visit may have to be switched to a telephone visit. With either a video or telephone visit, we are not always able to ensure that we have a secure connection.  By engaging in this virtual visit, you consent to the provision of healthcare and authorize for your insurance to be billed (if applicable) for the services provided during this visit. Depending on your insurance coverage, you may receive a charge related to this service.  I need to obtain your verbal consent now. Are you willing to proceed with your visit today? Peter Rose has provided verbal consent on 10/10/2022 for a virtual visit (video or telephone). Mar Daring, PA-C  Date: 10/10/2022 5:58 PM  Virtual Visit via Video Note   I, Mar Daring, connected with  Peter Rose  ([id number], Sep 20, 1956) on 10/10/22 at  5:45 PM EST by a video-enabled telemedicine application and verified that I am speaking with the correct person using two identifiers.  Location: Patient: Virtual Visit  Location Patient: Home Provider: Virtual Visit Location Provider: Home Office   I discussed the limitations of evaluation and management by telemedicine and the availability of in person appointments. The patient expressed understanding and agreed to proceed.    History of Present Illness: Peter Rose is a 66 y.o. who identifies as a male who was assigned male at birth, and is being seen today for URI symptoms.  HPI: URI  This is a new problem. The current episode started 1 to 4 weeks ago. The problem has been gradually worsening. There has been no fever. Associated symptoms include congestion, coughing, headaches, rhinorrhea (post nasal drainage), sinus pain and a sore throat. Pertinent negatives include no diarrhea, ear pain, nausea, plugged ear sensation, vomiting or wheezing. He has tried acetaminophen, decongestant, increased fluids, NSAIDs and antihistamine for the symptoms. The treatment provided no relief.    Covid 19 at home testing is negative  Problems:  Patient Active Problem List   Diagnosis Date Noted   Class 1 obesity due to excess calories with body mass index (BMI) of 30.0 to 30.9 in adult 04/21/2021   Carotid atherosclerosis 04/21/2021   Other spondylosis with radiculopathy, lumbar region 09/30/2020   HTN (hypertension) 12/17/2019   Erectile dysfunction 12/29/2018   Insomnia 12/26/2016   Anxiety 12/26/2016   Psoriasis     Allergies:  Allergies  Allergen Reactions   Codeine Other (See Comments)    feels wired and jittery   Hycodan [Hydrocodone Bit-Homatrop Mbr]     Makes pt  feel weird/jittery   Vicodin [Hydrocodone-Acetaminophen] Other (See Comments)    Pt feels "wired" with pain meds   Medications:  Current Outpatient Medications:    amoxicillin-clavulanate (AUGMENTIN) 875-125 MG tablet, Take 1 tablet by mouth 2 (two) times daily., Disp: 14 tablet, Rfl: 0   benzonatate (TESSALON) 100 MG capsule, Take 1 capsule (100 mg total) by mouth 3 (three) times daily  as needed., Disp: 30 capsule, Rfl: 0   aspirin EC 81 MG tablet, Take 1 tablet (81 mg total) by mouth daily. Swallow whole., Disp: 30 tablet, Rfl: 12   ibuprofen (ADVIL) 200 MG tablet, Take 600-800 mg by mouth every 6 (six) hours as needed for moderate pain., Disp: , Rfl:    levocetirizine (XYZAL) 5 MG tablet, TAKE 1 TABLET BY MOUTH AT BEDTIME AS NEEDED FOR ALLERGIES, Disp: 90 tablet, Rfl: 1   lisinopril-hydrochlorothiazide (ZESTORETIC) 10-12.5 MG tablet, TAKE 1/2 TABLET BY MOUTH DAILY, Disp: 45 tablet, Rfl: 0   methotrexate (RHEUMATREX) 2.5 MG tablet, TAKE 10 TABLETS BY MOUTH ONCE A WEEK WHEN NEEDED FOR PSORIASIS FLARE, Disp: 128 tablet, Rfl: 0   sildenafil (VIAGRA) 100 MG tablet, TAKE 1 TABLET BY MOUTH AS NEEDED FOR ED., Disp: 10 tablet, Rfl: 3   traZODone (DESYREL) 100 MG tablet, Take 2 tablets (200 mg total) by mouth at bedtime., Disp: 180 tablet, Rfl: 1  Observations/Objective: Patient is well-developed, well-nourished in no acute distress.  Resting comfortably at home.  Head is normocephalic, atraumatic.  No labored breathing.  Speech is clear and coherent with logical content.  Patient is alert and oriented at baseline.    Assessment and Plan: 1. Acute bacterial sinusitis - amoxicillin-clavulanate (AUGMENTIN) 875-125 MG tablet; Take 1 tablet by mouth 2 (two) times daily.  Dispense: 14 tablet; Refill: 0 - benzonatate (TESSALON) 100 MG capsule; Take 1 capsule (100 mg total) by mouth 3 (three) times daily as needed.  Dispense: 30 capsule; Refill: 0  - Worsening symptoms that have not responded to OTC medications.  - Will give Augmentin - Tessalon for cough - Steam and humidifier can help - Stay well hydrated and get plenty of rest.  - Seek in person evaluation if no symptom improvement or if symptoms worsen   Follow Up Instructions: I discussed the assessment and treatment plan with the patient. The patient was provided an opportunity to ask questions and all were answered. The  patient agreed with the plan and demonstrated an understanding of the instructions.  A copy of instructions were sent to the patient via MyChart unless otherwise noted below.    The patient was advised to call back or seek an in-person evaluation if the symptoms worsen or if the condition fails to improve as anticipated.  Time:  I spent 8 minutes with the patient via telehealth technology discussing the above problems/concerns.    Mar Daring, PA-C

## 2022-10-19 ENCOUNTER — Ambulatory Visit (INDEPENDENT_AMBULATORY_CARE_PROVIDER_SITE_OTHER): Payer: Medicare HMO

## 2022-10-19 VITALS — Ht 70.0 in | Wt 214.0 lb

## 2022-10-19 DIAGNOSIS — Z0000 Encounter for general adult medical examination without abnormal findings: Secondary | ICD-10-CM | POA: Diagnosis not present

## 2022-10-19 NOTE — Progress Notes (Signed)
Virtual Visit via Telephone Note  I connected with  Peter Rose on 10/19/22 at  2:00 PM EST by telephone and verified that I am speaking with the correct person using two identifiers.  Location: Patient: home Provider: [facility] Persons participating in the virtual visit: [place]   I discussed the limitations, risks, security and privacy concerns of performing an evaluation and management service by telephone and the availability of in person appointments. The patient expressed understanding and agreed to proceed.  Interactive audio and video telecommunications were attempted between this nurse and patient, however failed, due to patient having technical difficulties OR patient did not have access to video capability.  We continued and completed visit with audio only.  Some vital signs may be absent or patient reported.   Dionisio Cirilo, LPN  Subjective:   Peter Rose is a 67 y.o. male who presents for Medicare Annual/Subsequent preventive examination.  Review of Systems     Cardiac Risk Factors include: advanced age (>86mn, >>68women);male gender     Objective:    There were no vitals filed for this visit. There is no height or weight on file to calculate BMI.     10/19/2022    2:00 PM 09/29/2020   10:30 AM 10/17/2019   10:11 AM 09/10/2017    6:04 AM 09/04/2017   11:00 AM 09/04/2017   10:54 AM 08/20/2016    7:26 PM  Advanced Directives  Does Patient Have a Medical Advance Directive? No No No No No No No  Would patient like information on creating a medical advance directive? No - Patient declined No - Patient declined  No - Patient declined  No - Patient declined No - patient declined information    Current Medications (verified) Outpatient Encounter Medications as of 10/19/2022  Medication Sig   aspirin EC 81 MG tablet Take 1 tablet (81 mg total) by mouth daily. Swallow whole.   ibuprofen (ADVIL) 200 MG tablet Take 600-800 mg by mouth every 6  (six) hours as needed for moderate pain.   levocetirizine (XYZAL) 5 MG tablet TAKE 1 TABLET BY MOUTH AT BEDTIME AS NEEDED FOR ALLERGIES   lisinopril-hydrochlorothiazide (ZESTORETIC) 10-12.5 MG tablet TAKE 1/2 TABLET BY MOUTH DAILY   methotrexate (RHEUMATREX) 2.5 MG tablet TAKE 10 TABLETS BY MOUTH ONCE A WEEK WHEN NEEDED FOR PSORIASIS FLARE   sildenafil (VIAGRA) 100 MG tablet TAKE 1 TABLET BY MOUTH AS NEEDED FOR ED.   traZODone (DESYREL) 100 MG tablet Take 2 tablets (200 mg total) by mouth at bedtime.   amoxicillin-clavulanate (AUGMENTIN) 875-125 MG tablet Take 1 tablet by mouth 2 (two) times daily. (Patient not taking: Reported on 10/19/2022)   benzonatate (TESSALON) 100 MG capsule Take 1 capsule (100 mg total) by mouth 3 (three) times daily as needed. (Patient not taking: Reported on 10/19/2022)   No facility-administered encounter medications on file as of 10/19/2022.    Allergies (verified) Codeine, Hycodan [hydrocodone bit-homatrop mbr], and Vicodin [hydrocodone-acetaminophen]   History: Past Medical History:  Diagnosis Date   Complication of anesthesia    abdominal distention (brief ileus ves opioid induced constipation) following 09/10/17 knee arthroscopy   COVID    GERD (gastroesophageal reflux disease)    occasional   Medical history non-contributory    Psoriasis    Past Surgical History:  Procedure Laterality Date   ELBOW SURGERY Right    ligament repar 11/2017   KNEE ARTHROSCOPY WITH ANTERIOR CRUCIATE LIGAMENT (ACL) REPAIR WITH HAMSTRING GRAFT Right 09/10/2017  Procedure: KNEE ARTHROSCOPY WITH ANTERIOR CRUCIATE LIGAMENT (ACL) REPAIR WITH HAMSTRING GRAFT AND  ALLOGRAFT ,& MEDIAL MENISCECTOMY;  Surgeon: Thornton Park, MD;  Location: [facility];  Service: Orthopedics;  Laterality: Right;   LUMBAR LAMINECTOMY/DECOMPRESSION MICRODISCECTOMY Right 09/30/2020   Procedure: Right Lumbar four-five Laminectomy for facet/synovial cyst;  Surgeon: Kristeen Miss, MD;  Location: [place];  Service:  Neurosurgery;  Laterality: Right;   MELANOMA EXCISION Left 11/23/2021   Left Leg   MOHS SURGERY  11/14/2021   Family History  Problem Relation Age of Onset   Colon cancer Neg Hx    Prostate cancer Neg Hx    Social History   Socioeconomic History   Marital status: Single    Spouse name: Not on file   Number of children: 3   Years of education: 12   Highest education level: Not on file  Occupational History   Occupation: [employer]  Tobacco Use   Smoking status: Former    Packs/day: 1.00    Types: Cigarettes    Quit date: 01/06/2017    Years since quitting: 5.7   Smokeless tobacco: Never  Vaping Use   Vaping Use: Never used  Substance and Sexual Activity   Alcohol use: Yes    Comment: occasional   Drug use: No   Sexual activity: Not on file  Other Topics Concern   Not on file  Social History Narrative   Lives alone   Caffeine use:  2 cups - coffee   Tea daily   Social Determinants of Health   Financial Resource Strain: Low Risk  (10/19/2022)   Overall Financial Resource Strain (CARDIA)    Difficulty of Paying Living Expenses: Not hard at all  Food Insecurity: No Food Insecurity (10/19/2022)   Hunger Vital Sign    Worried About Running Out of Food in the Last Year: Never true    Ran Out of Food in the Last Year: Never true  Transportation Needs: No Transportation Needs (10/19/2022)   PRAPARE - Hydrologist (Medical): No    Lack of Transportation (Non-Medical): No  Physical Activity: Inactive (10/19/2022)   Exercise Vital Sign    Days of Exercise per Week: 0 days    Minutes of Exercise per Session: 0 min  Stress: No Stress Concern Present (10/19/2022)   [place]    Feeling of Stress : Not at all  Social Connections: Socially Isolated (10/19/2022)   Social Connection and Isolation Panel [NHANES]    Frequency of Communication with Friends and Family: More than three times a week     Frequency of Social Gatherings with Friends and Family: More than three times a week    Attends Religious Services: Never    Marine scientist or Organizations: No    Attends Music therapist: Never    Marital Status: Divorced    Tobacco Counseling Counseling given: Not Answered   Clinical Intake:  Pre-visit preparation completed: Yes  Pain : No/denies pain     Diabetes: No  How often do you need to have someone help you when you read instructions, pamphlets, or other written materials from your doctor or pharmacy?: 1 - Never  Diabetic?no  Interpreter Needed?: No  Information entered by :: Kirke Shaggy, LPN   Activities of Daily Living    10/19/2022    2:00 PM 09/21/2022   10:48 AM  In your present state of health, do you have any difficulty  performing the following activities:  Hearing? 0 0  Vision? 0 0  Difficulty concentrating or making decisions? 0 0  Walking or climbing stairs? 0 0  Dressing or bathing? 0 0  Doing errands, shopping? 0 0  Preparing Food and eating ? N   Using the Toilet? N   In the past six months, have you accidently leaked urine? N   Do you have problems with loss of bowel control? N   Managing your Medications? N   Managing your Finances? N   Housekeeping or managing your Housekeeping? N     Patient Care Team: Jearld Fenton, NP as PCP - General (Internal Medicine)  Indicate any recent Medical Services you may have received from other than [facility] providers in the past year (date may be approximate).     Assessment:   This is a routine wellness examination for Wojciech.  Hearing/Vision screen Hearing Screening - Comments:: No aids Vision Screening - Comments:: No glasses  Dietary issues and exercise activities discussed: Current Exercise Habits: The patient does not participate in regular exercise at present   Goals Addressed             This Visit's Progress    DIET - EAT MORE FRUITS AND VEGETABLES          Depression Screen    10/19/2022    1:59 PM 09/21/2022   10:47 AM 12/14/2021    8:10 AM 04/21/2021    1:29 PM 01/14/2021    7:58 AM 12/17/2019   10:56 AM 12/29/2018   10:41 AM  PHQ 2/9 Scores  PHQ - 2 Score 0 0 0 0 0 0 0  PHQ- 9 Score 0  0 0   1    Fall Risk    10/19/2022    2:00 PM 09/21/2022   10:48 AM 12/14/2021    8:10 AM  Fall Risk   Falls in the past year? 0 0 0  Number falls in past yr: 0  0  Injury with Fall? 0 0 0  Risk for fall due to : No Fall Risks  No Fall Risks  Follow up Falls prevention discussed;Falls evaluation completed  Falls evaluation completed    FALL RISK PREVENTION PERTAINING TO THE HOME:  Any stairs in or around the home? No  If so, are there any without handrails? No  Home free of loose throw rugs in walkways, pet beds, electrical cords, etc? Yes  Adequate lighting in your home to reduce risk of falls? Yes   ASSISTIVE DEVICES UTILIZED TO PREVENT FALLS:  Life alert? No  Use of a cane, walker or w/c? No  Grab bars in the bathroom? No  Shower chair or bench in shower? No  Elevated toilet seat or a handicapped toilet? No    Cognitive Function:        10/19/2022    2:01 PM  6CIT Screen  What Year? 0 points  What month? 0 points  What time? 0 points  Count back from 20 0 points  Months in reverse 0 points  Repeat phrase 0 points  Total Score 0 points    Immunizations Immunization History  Administered Date(s) Administered   Tdap 05/28/2012    TDAP status: Due, Education has been provided regarding the importance of this vaccine. Advised may receive this vaccine at local pharmacy or Health Dept. Aware to provide a copy of the vaccination record if obtained from local pharmacy or Health Dept. Verbalized acceptance and understanding.  Flu Vaccine  status: Declined, Education has been provided regarding the importance of this vaccine but patient still declined. Advised may receive this vaccine at local pharmacy or Health Dept. Aware to provide  a copy of the vaccination record if obtained from local pharmacy or Health Dept. Verbalized acceptance and understanding.  Pneumococcal vaccine status: Declined,  Education has been provided regarding the importance of this vaccine but patient still declined. Advised may receive this vaccine at local pharmacy or Health Dept. Aware to provide a copy of the vaccination record if obtained from local pharmacy or Health Dept. Verbalized acceptance and understanding.   Covid-19 vaccine status: Declined, Education has been provided regarding the importance of this vaccine but patient still declined. Advised may receive this vaccine at local pharmacy or Health Dept.or vaccine clinic. Aware to provide a copy of the vaccination record if obtained from local pharmacy or Health Dept. Verbalized acceptance and understanding.  Qualifies for Shingles Vaccine? Yes   Zostavax completed No   Shingrix Completed?: No.    Education has been provided regarding the importance of this vaccine. Patient has been advised to call insurance company to determine out of pocket expense if they have not yet received this vaccine. Advised may also receive vaccine at local pharmacy or Health Dept. Verbalized acceptance and understanding.  Screening Tests Health Maintenance  Topic Date Due   Zoster Vaccines- Shingrix (1 of 2) Never done   DTaP/Tdap/Td (2 - Td or Tdap) 05/28/2022   Pneumonia Vaccine 77+ Years old (1 - PCV) 12/15/2022 (Originally 09/14/2020)   COLONOSCOPY (Pts 45-63yr Insurance coverage will need to be confirmed)  12/15/2022 (Originally 09/14/2000)   INFLUENZA VACCINE  01/13/2023 (Originally 05/15/2022)   Medicare Annual Wellness (AWV)  10/20/2023   Hepatitis C Screening  Completed   HPV VACCINES  Aged Out   COVID-19 Vaccine  Discontinued    Health Maintenance  Health Maintenance Due  Topic Date Due   Zoster Vaccines- Shingrix (1 of 2) Never done   DTaP/Tdap/Td (2 - Td or Tdap) 05/28/2022    Declined  referral for colonoscopy  Lung Cancer Screening: (Low Dose CT Chest recommended if Age 68-80years, 30 pack-year currently smoking OR have quit w/in 15years.) does not qualify.     Additional Screening:  Hepatitis C Screening: does qualify; Completed 12/14/21  Vision Screening: Recommended annual ophthalmology exams for early detection of glaucoma and other disorders of the eye. Is the patient up to date with their annual eye exam?  No  Who is the provider or what is the name of the office in which the patient attends annual eye exams? No one If pt is not established with a provider, would they like to be referred to a provider to establish care? No .   Dental Screening: Recommended annual dental exams for proper oral hygiene  Community Resource Referral / Chronic Care Management: CRR required this visit?  No   CCM required this visit?  No      Plan:     I have personally reviewed and noted the following in the patient's chart:   Medical and social history Use of alcohol, tobacco or illicit drugs  Current medications and supplements including opioid prescriptions. Patient is not currently taking opioid prescriptions. Functional ability and status Nutritional status Physical activity Advanced directives List of other physicians Hospitalizations, surgeries, and ER visits in previous 12 months Vitals Screenings to include cognitive, depression, and falls Referrals and appointments  In addition, I have reviewed and discussed with patient certain preventive protocols,  quality metrics, and best practice recommendations. A written personalized care plan for preventive services as well as general preventive health recommendations were provided to patient.     Dionisio Jaking, LPN   12/21/1827   Nurse Notes: none

## 2022-10-19 NOTE — Patient Instructions (Signed)
Peter Rose , Thank you for taking time to come for your Medicare Wellness Visit. I appreciate your ongoing commitment to your health goals. Please review the following plan we discussed and let me know if I can assist you in the future.   Screening recommendations/referrals: Colonoscopy: declined referral Recommended yearly ophthalmology/optometry visit for glaucoma screening and checkup Recommended yearly dental visit for hygiene and checkup  Vaccinations: Influenza vaccine: n/d Pneumococcal vaccine: n/d Tdap vaccine: 05/28/12, due if have injury Shingles vaccine: n/d   Covid-19: n/d  Advanced directives: no  Conditions/risks identified: none  Next appointment: Follow up in one year for your annual wellness visit. 10/25/23 @ 11:15 am by phone  Preventive Care 65 Years and Older, Male Preventive care refers to lifestyle choices and visits with your health care provider that can promote health and wellness. What does preventive care include? A yearly physical exam. This is also called an annual well check. Dental exams once or twice a year. Routine eye exams. Ask your health care provider how often you should have your eyes checked. Personal lifestyle choices, including: Daily care of your teeth and gums. Regular physical activity. Eating a healthy diet. Avoiding tobacco and drug use. Limiting alcohol use. Practicing safe sex. Taking low doses of aspirin every day. Taking vitamin and mineral supplements as recommended by your health care provider. What happens during an annual well check? The services and screenings done by your health care provider during your annual well check will depend on your age, overall health, lifestyle risk factors, and family history of disease. Counseling  Your health care provider may ask you questions about your: Alcohol use. Tobacco use. Drug use. Emotional well-being. Home and relationship well-being. Sexual activity. Eating  habits. History of falls. Memory and ability to understand (cognition). Work and work Statistician. Screening  You may have the following tests or measurements: Height, weight, and BMI. Blood pressure. Lipid and cholesterol levels. These may be checked every 5 years, or more frequently if you are over 45 years old. Skin check. Lung cancer screening. You may have this screening every year starting at age 3 if you have a 30-pack-year history of smoking and currently smoke or have quit within the past 15 years. Fecal occult blood test (FOBT) of the stool. You may have this test every year starting at age 53. Flexible sigmoidoscopy or colonoscopy. You may have a sigmoidoscopy every 5 years or a colonoscopy every 10 years starting at age 44. Prostate cancer screening. Recommendations will vary depending on your family history and other risks. Hepatitis C blood test. Hepatitis B blood test. Sexually transmitted disease (STD) testing. Diabetes screening. This is done by checking your blood sugar (glucose) after you have not eaten for a while (fasting). You may have this done every 1-3 years. Abdominal aortic aneurysm (AAA) screening. You may need this if you are a current or former smoker. Osteoporosis. You may be screened starting at age 49 if you are at high risk. Talk with your health care provider about your test results, treatment options, and if necessary, the need for more tests. Vaccines  Your health care provider may recommend certain vaccines, such as: Influenza vaccine. This is recommended every year. Tetanus, diphtheria, and acellular pertussis (Tdap, Td) vaccine. You may need a Td booster every 10 years. Zoster vaccine. You may need this after age 63. Pneumococcal 13-valent conjugate (PCV13) vaccine. One dose is recommended after age 26. Pneumococcal polysaccharide (PPSV23) vaccine. One dose is recommended after age 1. Talk to  your health care provider about which screenings and  vaccines you need and how often you need them. This information is not intended to replace advice given to you by your health care provider. Make sure you discuss any questions you have with your health care provider. Document Released: 10/28/2015 Document Revised: 06/20/2016 Document Reviewed: 08/02/2015 Elsevier Interactive Patient Education  2017 [place] Prevention in the Home Falls can cause injuries. They can happen to people of all ages. There are many things you can do to make your home safe and to help prevent falls. What can I do on the outside of my home? Regularly fix the edges of walkways and driveways and fix any cracks. Remove anything that might make you trip as you walk through a door, such as a raised step or threshold. Trim any bushes or trees on the path to your home. Use bright outdoor lighting. Clear any walking paths of anything that might make someone trip, such as rocks or tools. Regularly check to see if handrails are loose or broken. Make sure that both sides of any steps have handrails. Any raised decks and porches should have guardrails on the edges. Have any leaves, snow, or ice cleared regularly. Use sand or salt on walking paths during winter. Clean up any spills in your garage right away. This includes oil or grease spills. What can I do in the bathroom? Use night lights. Install grab bars by the toilet and in the tub and shower. Do not use towel bars as grab bars. Use non-skid mats or decals in the tub or shower. If you need to sit down in the shower, use a plastic, non-slip stool. Keep the floor dry. Clean up any water that spills on the floor as soon as it happens. Remove soap buildup in the tub or shower regularly. Attach bath mats securely with double-sided non-slip rug tape. Do not have throw rugs and other things on the floor that can make you trip. What can I do in the bedroom? Use night lights. Make sure that you have a light by your  bed that is easy to reach. Do not use any sheets or blankets that are too big for your bed. They should not hang down onto the floor. Have a firm chair that has side arms. You can use this for support while you get dressed. Do not have throw rugs and other things on the floor that can make you trip. What can I do in the kitchen? Clean up any spills right away. Avoid walking on wet floors. Keep items that you use a lot in easy-to-reach places. If you need to reach something above you, use a strong step stool that has a grab bar. Keep electrical cords out of the way. Do not use floor polish or wax that makes floors slippery. If you must use wax, use non-skid floor wax. Do not have throw rugs and other things on the floor that can make you trip. What can I do with my stairs? Do not leave any items on the stairs. Make sure that there are handrails on both sides of the stairs and use them. Fix handrails that are broken or loose. Make sure that handrails are as long as the stairways. Check any carpeting to make sure that it is firmly attached to the stairs. Fix any carpet that is loose or worn. Avoid having throw rugs at the top or bottom of the stairs. If you do have throw rugs, attach  them to the floor with carpet tape. Make sure that you have a light switch at the top of the stairs and the bottom of the stairs. If you do not have them, ask someone to add them for you. What else can I do to help prevent falls? Wear shoes that: Do not have high heels. Have rubber bottoms. Are comfortable and fit you well. Are closed at the toe. Do not wear sandals. If you use a stepladder: Make sure that it is fully opened. Do not climb a closed stepladder. Make sure that both sides of the stepladder are locked into place. Ask someone to hold it for you, if possible. Clearly mark and make sure that you can see: Any grab bars or handrails. First and last steps. Where the edge of each step is. Use tools that  help you move around (mobility aids) if they are needed. These include: Canes. Walkers. Scooters. Crutches. Turn on the lights when you go into a dark area. Replace any light bulbs as soon as they burn out. Set up your furniture so you have a clear path. Avoid moving your furniture around. If any of your floors are uneven, fix them. If there are any pets around you, be aware of where they are. Review your medicines with your doctor. Some medicines can make you feel dizzy. This can increase your chance of falling. Ask your doctor what other things that you can do to help prevent falls. This information is not intended to replace advice given to you by your health care provider. Make sure you discuss any questions you have with your health care provider. Document Released: 07/28/2009 Document Revised: 03/08/2016 Document Reviewed: 11/05/2014 Elsevier Interactive Patient Education  2017 [employer].

## 2022-12-01 DIAGNOSIS — Z0100 Encounter for examination of eyes and vision without abnormal findings: Secondary | ICD-10-CM | POA: Diagnosis not present

## 2023-01-18 ENCOUNTER — Other Ambulatory Visit: Payer: Self-pay | Admitting: Internal Medicine

## 2023-01-18 NOTE — Telephone Encounter (Signed)
Requested Prescriptions  Pending Prescriptions Disp Refills   levocetirizine (XYZAL) 5 MG tablet [Pharmacy Med Name: LEVOCETIRIZINE 5MG  TABLETS] 90 tablet 1    Sig: TAKE 1 TABLET BY MOUTH AT BEDTIME AS NEEDED FOR ALLERGIES     Ear, Nose, and Throat:  Antihistamines - levocetirizine dihydrochloride Passed - 01/18/2023  3:31 PM      Passed - Cr in normal range and within 360 days    Creat  Date Value Ref Range Status  09/21/2022 1.11 0.70 - 1.35 mg/dL Final         Passed - eGFR is 10 or above and within 360 days    GFR calc Af Amer  Date Value Ref Range Status  09/12/2017 >60 >60 mL/min Final    Comment:    (NOTE) The eGFR has been calculated using the CKD EPI equation. This calculation has not been validated in all clinical situations. eGFR's persistently <60 mL/min signify possible Chronic Kidney Disease.    GFR calc non Af Amer  Date Value Ref Range Status  09/12/2017 59 (L) >60 mL/min Final   GFR  Date Value Ref Range Status  10/28/2020 72.21 >60.00 mL/min Final    Comment:    Calculated using the CKD-EPI Creatinine Equation (2021)   eGFR  Date Value Ref Range Status  09/21/2022 73 > OR = 60 mL/min/1.62m2 Final         Passed - Valid encounter within last 12 months    Recent Outpatient Visits           3 months ago Atherosclerosis of both carotid arteries   [place] [facility] [place], [place] W, NP   1 year ago Encounter for general adult medical examination with abnormal findings   [place] [facility] [place], Salvadore [place], NP   1 year ago Stage 3a chronic kidney disease [facility])   [place] [facility] [place], Salvadore [place], NP               sildenafil (VIAGRA) 100 MG tablet [Pharmacy Med Name: SILDENAFIL 100MG  TABLETS] 10 tablet 3    Sig: TAKE 1 TABLET BY MOUTH AS NEEDED FOR ERECTILE DYSFUNCTION     Urology: Erectile Dysfunction Agents Passed - 01/18/2023  3:31 PM      Passed - AST in normal range and within 360  days    AST  Date Value Ref Range Status  09/21/2022 25 10 - 35 U/L Final         Passed - ALT in normal range and within 360 days    ALT  Date Value Ref Range Status  09/21/2022 23 9 - 46 U/L Final         Passed - Last BP in normal range    BP Readings from Last 1 Encounters:  09/21/22 136/84         Passed - Valid encounter within last 12 months    Recent Outpatient Visits           3 months ago Atherosclerosis of both carotid arteries   [facility] [place], Salvadore [place], NP   1 year ago Encounter for general adult medical examination with abnormal findings   [place] [facility] [place], Salvadore Oxford, NP   1 year ago Stage 3a chronic kidney disease [facility])   [place] [facility] [place], Salvadore Oxford, [place]

## 2023-01-18 NOTE — Telephone Encounter (Signed)
Requested Prescriptions  Pending Prescriptions Disp Refills   levocetirizine (XYZAL) 5 MG tablet [Pharmacy Med Name: LEVOCETIRIZINE 5MG  TABLETS] 90 tablet 1    Sig: TAKE 1 TABLET BY MOUTH AT BEDTIME AS NEEDED FOR ALLERGIES     Ear, Nose, and Throat:  Antihistamines - levocetirizine dihydrochloride Passed - 01/18/2023  3:29 PM      Passed - Cr in normal range and within 360 days    Creat  Date Value Ref Range Status  09/21/2022 1.11 0.70 - 1.35 mg/dL Final         Passed - eGFR is 10 or above and within 360 days    GFR calc Af Amer  Date Value Ref Range Status  09/12/2017 >60 >60 mL/min Final    Comment:    (NOTE) The eGFR has been calculated using the CKD EPI equation. This calculation has not been validated in all clinical situations. eGFR's persistently <60 mL/min signify possible Chronic Kidney Disease.    GFR calc non Af Amer  Date Value Ref Range Status  09/12/2017 59 (L) >60 mL/min Final   GFR  Date Value Ref Range Status  10/28/2020 72.21 >60.00 mL/min Final    Comment:    Calculated using the CKD-EPI Creatinine Equation (2021)   eGFR  Date Value Ref Range Status  09/21/2022 73 > OR = 60 mL/min/1.61m2 Final         Passed - Valid encounter within last 12 months    Recent Outpatient Visits           3 months ago Atherosclerosis of both carotid arteries   [place] [facility] [place], Salvadore [place], NP   1 year ago Encounter for general adult medical examination with abnormal findings   [place] [facility] [place], Salvadore [place], NP   1 year ago Stage 3a chronic kidney disease [facility])   [place] [facility] [place], Salvadore [place], [place]

## 2023-01-28 ENCOUNTER — Other Ambulatory Visit: Payer: Self-pay | Admitting: Internal Medicine

## 2023-01-29 NOTE — Telephone Encounter (Signed)
Requested medications are due for refill today.  Provider to determine  Requested medications are on the active medications list.  yes  Last refill. 05/09/2022 #[id number] 0 rf  Future visit scheduled.   yes  Notes to clinic.  Refill not delegated.    Requested Prescriptions  Pending Prescriptions Disp Refills   methotrexate (RHEUMATREX) 2.5 MG tablet [Pharmacy Med Name: METHOTREXATE 2.5MG  TABLETS - YELLOW] 128 tablet 0    Sig: TAKE 10 TABLET BY MOUTH ONCE A WEEK WHEN NEEDED FOR PSORIASIS FLARE     Not Delegated - Immunology: Immunosuppressive Agents - methotrexate Failed - 01/28/2023  7:14 PM      Failed - This refill cannot be delegated      Failed - ALT in normal range and within 90 days    ALT  Date Value Ref Range Status  09/21/2022 23 9 - 46 U/L Final         Failed - AST in normal range and within 90 days    AST  Date Value Ref Range Status  09/21/2022 25 10 - 35 U/L Final         Failed - Cr in normal range and within 90 days    Creat  Date Value Ref Range Status  09/21/2022 1.11 0.70 - 1.35 mg/dL Final         Failed - HCT in normal range and within 90 days    HCT  Date Value Ref Range Status  09/21/2022 47.0 38.5 - 50.0 % Final         Failed - HGB in normal range and within 90 days    Hemoglobin  Date Value Ref Range Status  09/21/2022 16.5 13.2 - 17.1 g/dL Final         Failed - PLT in normal range and within 90 days    Platelets  Date Value Ref Range Status  09/21/2022 248 140 - 400 Thousand/uL Final         Failed - WBC in normal range and within 90 days    WBC  Date Value Ref Range Status  09/21/2022 6.2 3.8 - 10.8 Thousand/uL Final         Failed - Albumin in normal range and within 360 days    Albumin  Date Value Ref Range Status  12/17/2019 4.4 3.5 - 5.2 g/dL Final         Failed - eGFR is 10 or above and within 90 days    GFR calc Af Amer  Date Value Ref Range Status  09/12/2017 >60 >60 mL/min Final    Comment:    (NOTE) The eGFR  has been calculated using the CKD EPI equation. This calculation has not been validated in all clinical situations. eGFR's persistently <60 mL/min signify possible Chronic Kidney Disease.    GFR calc non Af Amer  Date Value Ref Range Status  09/12/2017 59 (L) >60 mL/min Final   GFR  Date Value Ref Range Status  10/28/2020 72.21 >60.00 mL/min Final    Comment:    Calculated using the CKD-EPI Creatinine Equation (2021)   eGFR  Date Value Ref Range Status  09/21/2022 73 > OR = 60 mL/min/1.16m2 Final         Failed - Valid encounter within last 3 months    Recent Outpatient Visits           4 months ago Atherosclerosis of both carotid arteries   [facility] [address], Salv[place], [place]  1 year ago Encounter for general adult medical examination with abnormal findings   [place] [facility] [place], Salvadore Oxford, NP   1 year ago Stage 3a chronic kidney disease [facility])   [place] [facility] [place], Salvadore [place], [place]              Passed - Patient is not pregnant

## 2023-01-30 ENCOUNTER — Other Ambulatory Visit: Payer: Self-pay | Admitting: Internal Medicine

## 2023-01-31 NOTE — Telephone Encounter (Signed)
Unable to refill per protocol, Rx refilled 01/30/23, duplicate request. E-Prescribing Status: Receipt confirmed by pharmacy (01/30/2023  7:55 AM EDT)   Requested Prescriptions  Pending Prescriptions Disp Refills   methotrexate (RHEUMATREX) 2.5 MG tablet [Pharmacy Med Name: METHOTREXATE 2.5MG  TABLETS - YELLOW] 129 tablet     Sig: TAKE 10 TABLETS BY MOUTH ONCE A WEEK WHEN NEEDED FOR PSORIASIS FLARE.     Not Delegated - Immunology: Immunosuppressive Agents - methotrexate Failed - 01/30/2023  8:45 AM      Failed - This refill cannot be delegated      Failed - ALT in normal range and within 90 days    ALT  Date Value Ref Range Status  09/21/2022 23 9 - 46 U/L Final         Failed - AST in normal range and within 90 days    AST  Date Value Ref Range Status  09/21/2022 25 10 - 35 U/L Final         Failed - Cr in normal range and within 90 days    Creat  Date Value Ref Range Status  09/21/2022 1.11 0.70 - 1.35 mg/dL Final         Failed - HCT in normal range and within 90 days    HCT  Date Value Ref Range Status  09/21/2022 47.0 38.5 - 50.0 % Final         Failed - HGB in normal range and within 90 days    Hemoglobin  Date Value Ref Range Status  09/21/2022 16.5 13.2 - 17.1 g/dL Final         Failed - PLT in normal range and within 90 days    Platelets  Date Value Ref Range Status  09/21/2022 248 140 - 400 Thousand/uL Final         Failed - WBC in normal range and within 90 days    WBC  Date Value Ref Range Status  09/21/2022 6.2 3.8 - 10.8 Thousand/uL Final         Failed - Albumin in normal range and within 360 days    Albumin  Date Value Ref Range Status  12/17/2019 4.4 3.5 - 5.2 g/dL Final         Failed - eGFR is 10 or above and within 90 days    GFR calc Af Amer  Date Value Ref Range Status  09/12/2017 >60 >60 mL/min Final    Comment:    (NOTE) The eGFR has been calculated using the CKD EPI equation. This calculation has not been validated in all clinical  situations. eGFR's persistently <60 mL/min signify possible Chronic Kidney Disease.    GFR calc non Af Amer  Date Value Ref Range Status  09/12/2017 59 (L) >60 mL/min Final   GFR  Date Value Ref Range Status  10/28/2020 72.21 >60.00 mL/min Final    Comment:    Calculated using the CKD-EPI Creatinine Equation (2021)   eGFR  Date Value Ref Range Status  09/21/2022 73 > OR = 60 mL/min/1.41m2 Final         Failed - Valid encounter within last 3 months    Recent Outpatient Visits           4 months ago Atherosclerosis of both carotid arteries   [place] [facility] [place], Salvadore [place], NP   1 year ago Encounter for general adult medical examination with abnormal findings   [place] [facility] [place], [place]  W, NP   1 year ago Stage 3a chronic kidney disease [facility])   [place] [facility] [place], Salv[place], [place]              Passed - Patient is not pregnant

## 2023-03-06 ENCOUNTER — Ambulatory Visit: Payer: Self-pay | Admitting: *Deleted

## 2023-03-06 NOTE — Telephone Encounter (Signed)
Summary: eye drops   Patient called requested provider call him in Vigamox eye drops because he leaves his contacts in his eyes too long. He doesn't feel like he need to come into the office. Please contact patient for further instructions       Chief Complaint: requesting medication, right eye irritation Symptoms: left contacts in too long, right eye irritation reported no redness no itching, pain. Has used samples of medication from eye dr and has used last sample. Patient did remove contact and irritation better Frequency: today  Pertinent Negatives: Patient denies eye drainage no redness no pain  Disposition: [] ED /[] Urgent Care (no appt availability in office) / [] Appointment(In office/virtual)/ []  [place] [facility]/ [] Home Care/ [] Refused Recommended Disposition /[] [name] [facility]/ [x]  Follow-up with PCP Additional Notes:   Offered My Chart VV or OV . Patient reports he would like Rx for Vigamox to use prn as he has done before. Declined appt at this time. Please advise.       Reason for Disposition  MILD eye pains are a recurrent problem  Answer Assessment - Initial Assessment Questions 1. ONSET: "When did the pain start?" (e.g., minutes, hours, days)     No pain  2. TIMING: "Does the pain come and go, or has it been constant since it started?" (e.g., constant, intermittent, fleeting)     Irritation in right eye from wearing contacts too long 3. SEVERITY: "How bad is the pain?"   (Scale 1-10; mild, moderate or severe)   - MILD (1-3): doesn't interfere with normal activities    - MODERATE (4-7): interferes with normal activities or awakens from sleep    - SEVERE (8-10): excruciating pain and patient unable to do normal activities     No pain irritation  4. LOCATION: "Where does it hurt?"  (e.g., eyelid, eye, cheekbone)     Right eye  5. CAUSE: "What do you think is causing the pain?"     Wearing contacts too long 6. VISION: "Do you have blurred vision or  changes in your vision?"      Na  7. EYE DISCHARGE: "Is there any discharge (pus) from the eye(s)?"  If Yes, ask: "What color is it?"      No  8. FEVER: "Do you have a fever?" If Yes, ask: "What is it, how was it measured, and when did it start?"      No  9. OTHER SYMPTOMS: "Do you have any other symptoms?" (e.g., headache, nasal discharge, facial rash)     None  10. PREGNANCY: "Is there any chance you are pregnant?" "When was your last menstrual period?"       na  Protocols used: Eye Pain and Other Symptoms-A-AH

## 2023-03-07 NOTE — Telephone Encounter (Signed)
He is going to need an appointment if he wants me to send in medication for him

## 2023-04-12 DIAGNOSIS — Z85820 Personal history of malignant melanoma of skin: Secondary | ICD-10-CM | POA: Diagnosis not present

## 2023-04-12 DIAGNOSIS — D2271 Melanocytic nevi of right lower limb, including hip: Secondary | ICD-10-CM | POA: Diagnosis not present

## 2023-04-12 DIAGNOSIS — D225 Melanocytic nevi of trunk: Secondary | ICD-10-CM | POA: Diagnosis not present

## 2023-04-12 DIAGNOSIS — D2262 Melanocytic nevi of left upper limb, including shoulder: Secondary | ICD-10-CM | POA: Diagnosis not present

## 2023-04-12 DIAGNOSIS — Z85828 Personal history of other malignant neoplasm of skin: Secondary | ICD-10-CM | POA: Diagnosis not present

## 2023-04-12 DIAGNOSIS — D2261 Melanocytic nevi of right upper limb, including shoulder: Secondary | ICD-10-CM | POA: Diagnosis not present

## 2023-05-02 ENCOUNTER — Other Ambulatory Visit: Payer: Self-pay | Admitting: Internal Medicine

## 2023-05-02 DIAGNOSIS — F5101 Primary insomnia: Secondary | ICD-10-CM

## 2023-05-03 NOTE — Telephone Encounter (Signed)
Requested Prescriptions  Pending Prescriptions Disp Refills   traZODone (DESYREL) 100 MG tablet [Pharmacy Med Name: TRAZODONE 100MG  TABLETS] 180 tablet 0    Sig: TAKE 2 TABLETS(200 MG) BY MOUTH AT BEDTIME     Psychiatry: Antidepressants - Serotonin Modulator Failed - 05/02/2023  3:14 AM      Failed - Valid encounter within last 6 months    Recent Outpatient Visits           7 months ago Atherosclerosis of both carotid arteries   [place] [facility] [place], Salvadore Oxford, NP   1 year ago Encounter for general adult medical examination with abnormal findings   [place] [facility] [place], Salvadore Oxford, NP   2 years ago Stage 3a chronic kidney disease [facility])   [place] [facility] [place], Salvadore Oxford, NP       Future Appointments             In 5 days [place], Salvadore Oxford, NP [place] [facility], [facility]

## 2023-05-03 NOTE — Telephone Encounter (Signed)
Called pt and made appt for OV.

## 2023-05-03 NOTE — Telephone Encounter (Signed)
Requested Prescriptions  Pending Prescriptions Disp Refills   traZODone (DESYREL) 100 MG tablet [Pharmacy Med Name: TRAZODONE 100MG  TABLETS] 180 tablet 1    Sig: TAKE 2 TABLETS(200 MG) BY MOUTH AT BEDTIME     Psychiatry: Antidepressants - Serotonin Modulator Failed - 05/02/2023  8:11 AM      Failed - Valid encounter within last 6 months    Recent Outpatient Visits           7 months ago Atherosclerosis of both carotid arteries   [place] [facility] [place], [place], NP   1 year ago Encounter for general adult medical examination with abnormal findings   [facility] [place], Salvadore Oxford, NP   2 years ago Stage 3a chronic kidney disease [facility])   [place] [facility] [place], Salvadore Oxford, NP       Future Appointments             In 5 days [place], Salvadore Oxford, NP [place] [facility]             lisinopril-hydrochlorothiazide (ZESTORETIC) 10-12.5 MG tablet [Pharmacy Med Name: LISINOPRIL-HCTZ 10/12.5MG  TABLETS] 45 tablet 0    Sig: TAKE 1/2 TABLET BY MOUTH DAILY     Cardiovascular:  ACEI + Diuretic Combos Failed - 05/02/2023  8:11 AM      Failed - Na in normal range and within 180 days    Sodium  Date Value Ref Range Status  09/21/2022 138 135 - 146 mmol/L Final         Failed - K in normal range and within 180 days    Potassium  Date Value Ref Range Status  09/21/2022 4.7 3.5 - 5.3 mmol/L Final         Failed - Cr in normal range and within 180 days    Creat  Date Value Ref Range Status  09/21/2022 1.11 0.70 - 1.35 mg/dL Final         Failed - eGFR is 30 or above and within 180 days    GFR calc Af Amer  Date Value Ref Range Status  09/12/2017 >60 >60 mL/min Final    Comment:    (NOTE) The eGFR has been calculated using the CKD EPI equation. This calculation has not been validated in all clinical situations. eGFR's persistently <60 mL/min signify possible Chronic Kidney Disease.    GFR  calc non Af Amer  Date Value Ref Range Status  09/12/2017 59 (L) >60 mL/min Final   GFR  Date Value Ref Range Status  10/28/2020 72.21 >60.00 mL/min Final    Comment:    Calculated using the CKD-EPI Creatinine Equation (2021)   eGFR  Date Value Ref Range Status  09/21/2022 73 > OR = 60 mL/min/1.54m2 Final         Failed - Valid encounter within last 6 months    Recent Outpatient Visits           7 months ago Atherosclerosis of both carotid arteries   [place] [facility] [place], Salvadore Oxford, NP   1 year ago Encounter for general adult medical examination with abnormal findings   [facility] [place], Salvadore Oxford, NP   2 years ago Stage 3a chronic kidney disease [facility])   [place] [facility] [place], Salvadore Oxford, NP       Future Appointments  In 5 days [place], Salvadore Oxford, NP [place] [facility]            Passed - Patient is not pregnant      Passed - Last BP in normal range    BP Readings from Last 1 Encounters:  09/21/22 136/84

## 2023-05-08 ENCOUNTER — Ambulatory Visit (INDEPENDENT_AMBULATORY_CARE_PROVIDER_SITE_OTHER): Payer: Medicare HMO | Admitting: Internal Medicine

## 2023-05-08 ENCOUNTER — Encounter: Payer: Self-pay | Admitting: Internal Medicine

## 2023-05-08 ENCOUNTER — Ambulatory Visit
Admission: RE | Admit: 2023-05-08 | Discharge: 2023-05-08 | Disposition: A | Discharge status: Home / Self Care | Payer: Medicare HMO | Attending: Internal Medicine | Admitting: Internal Medicine

## 2023-05-08 ENCOUNTER — Ambulatory Visit
Admission: RE | Admit: 2023-05-08 | Discharge: 2023-05-08 | Disposition: A | Discharge status: Home / Self Care | Payer: Medicare HMO | Source: Ambulatory Visit | Attending: Internal Medicine | Admitting: Internal Medicine

## 2023-05-08 VITALS — BP 122/78 | HR 86 | Ht 70.0 in | Wt 212.0 lb

## 2023-05-08 DIAGNOSIS — Z125 Encounter for screening for malignant neoplasm of prostate: Secondary | ICD-10-CM | POA: Diagnosis not present

## 2023-05-08 DIAGNOSIS — I6529 Occlusion and stenosis of unspecified carotid artery: Secondary | ICD-10-CM

## 2023-05-08 DIAGNOSIS — Z6830 Body mass index (BMI) 30.0-30.9, adult: Secondary | ICD-10-CM

## 2023-05-08 DIAGNOSIS — M25552 Pain in left hip: Secondary | ICD-10-CM | POA: Diagnosis not present

## 2023-05-08 DIAGNOSIS — Z1211 Encounter for screening for malignant neoplasm of colon: Secondary | ICD-10-CM | POA: Diagnosis not present

## 2023-05-08 DIAGNOSIS — E6609 Other obesity due to excess calories: Secondary | ICD-10-CM

## 2023-05-08 DIAGNOSIS — Z0001 Encounter for general adult medical examination with abnormal findings: Secondary | ICD-10-CM | POA: Diagnosis not present

## 2023-05-08 DIAGNOSIS — R7303 Prediabetes: Secondary | ICD-10-CM | POA: Diagnosis not present

## 2023-05-08 LAB — CBC
Hemoglobin: 15.4 g/dL (ref 13.2–17.1)
MCH: 31.4 pg (ref 27.0–33.0)
MCHC: 33.4 g/dL (ref 32.0–36.0)
MCV: 94.1 fL (ref 80.0–100.0)
MPV: 9.4 fL (ref 7.5–12.5)
Platelets: 260 10*3/uL (ref 140–400)
RDW: 14.5 % (ref 11.0–15.0)

## 2023-05-08 MED ORDER — LISINOPRIL-HYDROCHLOROTHIAZIDE 10-12.5 MG PO TABS: 0.5000 | ORAL_TABLET | Freq: Every day | ORAL | 1 refills | Status: DC

## 2023-05-08 NOTE — Assessment & Plan Note (Signed)
Encouraged diet and exercise for weight loss ?

## 2023-05-08 NOTE — Patient Instructions (Signed)
Health Maintenance After Age 68 After age 68, you are at a higher risk for certain long-term diseases and infections as well as injuries from falls. Falls are a major cause of broken bones and head injuries in people who are older than age 68. Getting regular preventive care can help to keep you healthy and well. Preventive care includes getting regular testing and making lifestyle changes as recommended by your health care provider. Talk with your health care provider about: Which screenings and tests you should have. A screening is a test that checks for a disease when you have no symptoms. A diet and exercise plan that is right for you. What should I know about screenings and tests to prevent falls? Screening and testing are the best ways to find a health problem early. Early diagnosis and treatment give you the best chance of managing medical conditions that are common after age 68. Certain conditions and lifestyle choices may make you more likely to have a fall. Your health care provider may recommend: Regular vision checks. Poor vision and conditions such as cataracts can make you more likely to have a fall. If you wear glasses, make sure to get your prescription updated if your vision changes. Medicine review. Work with your health care provider to regularly review all of the medicines you are taking, including over-the-counter medicines. Ask your health care provider about any side effects that may make you more likely to have a fall. Tell your health care provider if any medicines that you take make you feel dizzy or sleepy. Strength and balance checks. Your health care provider may recommend certain tests to check your strength and balance while standing, walking, or changing positions. Foot health exam. Foot pain and numbness, as well as not wearing proper footwear, can make you more likely to have a fall. Screenings, including: Osteoporosis screening. Osteoporosis is a condition that causes  the bones to get weaker and break more easily. Blood pressure screening. Blood pressure changes and medicines to control blood pressure can make you feel dizzy. Depression screening. You may be more likely to have a fall if you have a fear of falling, feel depressed, or feel unable to do activities that you used to do. Alcohol use screening. Using too much alcohol can affect your balance and may make you more likely to have a fall. Follow these instructions at home: Lifestyle Do not drink alcohol if: Your health care provider tells you not to drink. If you drink alcohol: Limit how much you have to: 0-1 drink a day for women. 0-2 drinks a day for men. Know how much alcohol is in your drink. In the [place] one drink equals one 12 oz bottle of beer (355 mL), one 5 oz glass of wine (148 mL), or one 1 oz glass of hard liquor (44 mL). Do not use any products that contain nicotine or tobacco. These products include cigarettes, chewing tobacco, and vaping devices, such as e-cigarettes. If you need help quitting, ask your health care provider. Activity  Follow a regular exercise program to stay fit. This will help you maintain your balance. Ask your health care provider what types of exercise are appropriate for you. If you need a cane or walker, use it as recommended by your health care provider. Wear supportive shoes that have nonskid soles. Safety  Remove any tripping hazards, such as rugs, cords, and clutter. Install safety equipment such as grab bars in bathrooms and safety rails on stairs. Keep rooms and walkways   well-lit. General instructions Talk with your health care provider about your risks for falling. Tell your health care provider if: You fall. Be sure to tell your health care provider about all falls, even ones that seem minor. You feel dizzy, tiredness (fatigue), or off-balance. Take over-the-counter and prescription medicines only as told by your health care provider. These include  supplements. Eat a healthy diet and maintain a healthy weight. A healthy diet includes low-fat dairy products, low-fat (lean) meats, and fiber from whole grains, beans, and lots of fruits and vegetables. Stay current with your vaccines. Schedule regular health, dental, and eye exams. Summary Having a healthy lifestyle and getting preventive care can help to protect your health and wellness after age 68. Screening and testing are the best way to find a health problem early and help you avoid having a fall. Early diagnosis and treatment give you the best chance for managing medical conditions that are more common for people who are older than age 68. Falls are a major cause of broken bones and head injuries in people who are older than age 68. Take precautions to prevent a fall at home. Work with your health care provider to learn what changes you can make to improve your health and wellness and to prevent falls. This information is not intended to replace advice given to you by your health care provider. Make sure you discuss any questions you have with your health care provider. Document Revised: 02/20/2021 Document Reviewed: 02/20/2021 Elsevier Patient Education  2024 [employer]  

## 2023-05-08 NOTE — Progress Notes (Signed)
Subjective:    Patient ID: Peter Rose, male    DOB: [birth year], 67 y.o.   MRN: [id number]  HPI  Patient presents to clinic today for her annual exam.  Flu: Never Tetanus: 05/2012 COVID: Never Pneumovax: Never Prevnar: Never Shingrix: Never PSA screening: 12/2021 Colon screening: never Vision screening: annually Dentist: biannually  Diet: He does eat meat. He consumes fruits and veggies. He does eat fried foods. He drinks mostly sweet tea, water, coffee. Exercise: Walking   Review of Systems     Past Medical History:  Diagnosis Date   Complication of anesthesia    abdominal distention (brief ileus ves opioid induced constipation) following 09/10/17 knee arthroscopy   COVID    GERD (gastroesophageal reflux disease)    occasional   Medical history non-contributory    Psoriasis     Current Outpatient Medications  Medication Sig Dispense Refill   amoxicillin-clavulanate (AUGMENTIN) 875-125 MG tablet Take 1 tablet by mouth 2 (two) times daily. (Patient not taking: Reported on 10/19/2022) 14 tablet 0   aspirin EC 81 MG tablet Take 1 tablet (81 mg total) by mouth daily. Swallow whole. 30 tablet 12   benzonatate (TESSALON) 100 MG capsule Take 1 capsule (100 mg total) by mouth 3 (three) times daily as needed. (Patient not taking: Reported on 10/19/2022) 30 capsule 0   ibuprofen (ADVIL) 200 MG tablet Take 600-800 mg by mouth every 6 (six) hours as needed for moderate pain.     levocetirizine (XYZAL) 5 MG tablet TAKE 1 TABLET BY MOUTH AT BEDTIME AS NEEDED FOR ALLERGIES 90 tablet 1   lisinopril-hydrochlorothiazide (ZESTORETIC) 10-12.5 MG tablet TAKE 1/2 TABLET BY MOUTH DAILY 45 tablet 0   methotrexate (RHEUMATREX) 2.5 MG tablet TAKE 10 TABLET BY MOUTH ONCE A WEEK WHEN NEEDED FOR PSORIASIS FLARE 128 tablet 0   sildenafil (VIAGRA) 100 MG tablet TAKE 1 TABLET BY MOUTH AS NEEDED FOR ERECTILE DYSFUNCTION 10 tablet 3   traZODone (DESYREL) 100 MG tablet TAKE 2 TABLETS(200 MG) BY MOUTH AT  BEDTIME 180 tablet 0   No current facility-administered medications for this visit.    Allergies  Allergen Reactions   Codeine Other (See Comments)    feels wired and jittery   Hycodan [Hydrocodone Bit-Homatrop Mbr]     Makes pt feel weird/jittery   Vicodin [Hydrocodone-Acetaminophen] Other (See Comments)    Pt feels "wired" with pain meds    Family History  Problem Relation Age of Onset   Colon cancer Neg Hx    Prostate cancer Neg Hx     Social History   Socioeconomic History   Marital status: Single    Spouse name: Not on file   Number of children: 3   Years of education: 5   Highest education level: Not on file  Occupational History   Occupation: [employer]  Tobacco Use   Smoking status: Former    Current packs/day: 0.00    Types: Cigarettes    Quit date: 01/06/2017    Years since quitting: 6.3   Smokeless tobacco: Never  Vaping Use   Vaping status: Never Used  Substance and Sexual Activity   Alcohol use: Yes    Comment: occasional   Drug use: No   Sexual activity: Not on file  Other Topics Concern   Not on file  Social History Narrative   Lives alone   Caffeine use:  2 cups - coffee   Tea daily   Social Determinants of Health   Financial Resource Strain: Low  Risk  (10/19/2022)   Overall Financial Resource Strain (CARDIA)    Difficulty of Paying Living Expenses: Not hard at all  Food Insecurity: No Food Insecurity (10/19/2022)   Hunger Vital Sign    Worried About Running Out of Food in the Last Year: Never true    Ran Out of Food in the Last Year: Never true  Transportation Needs: No Transportation Needs (10/19/2022)   PRAPARE - Administrator, Civil Service (Medical): No    Lack of Transportation (Non-Medical): No  Physical Activity: Inactive (10/19/2022)   Exercise Vital Sign    Days of Exercise per Week: 0 days    Minutes of Exercise per Session: 0 min  Stress: No Stress Concern Present (10/19/2022)   [employer] of Occupational Health  - Occupational Stress Questionnaire    Feeling of Stress : Not at all  Social Connections: Socially Isolated (10/19/2022)   Social Connection and Isolation Panel [NHANES]    Frequency of Communication with Friends and Family: More than three times a week    Frequency of Social Gatherings with Friends and Family: More than three times a week    Attends Religious Services: Never    Database administrator or Organizations: No    Attends Banker Meetings: Never    Marital Status: Divorced  Catering manager Violence: Not At Risk (10/19/2022)   Humiliation, Afraid, Rape, and Kick questionnaire    Fear of Current or Ex-Partner: No    Emotionally Abused: No    Physically Abused: No    Sexually Abused: No     Constitutional: Denies fever, malaise, fatigue, headache or abrupt weight changes.  HEENT: Denies eye pain, eye redness, ear pain, ringing in the ears, wax buildup, runny nose, nasal congestion, bloody nose, or sore throat. Respiratory: Denies difficulty breathing, shortness of breath, cough or sputum production.   Cardiovascular: Denies chest pain, chest tightness, palpitations or swelling in the hands or feet.  Gastrointestinal: Pt reports intermittent diarrhea. Denies abdominal pain, bloating, constipation, or blood in the stool.  GU: Patient reports erectile dysfunction.  Denies urgency, frequency, pain with urination, burning sensation, blood in urine, odor or discharge. Musculoskeletal: Patient reports chronic back pain, left hip pain.  Denies decrease in range of motion, difficulty with gait, or joint swelling.  Skin: Patient reports psoriasis.  Denies redness, rashes, lesions or ulcercations.  Neurological: Patient reports insomnia.  Denies dizziness, difficulty with memory, difficulty with speech or problems with balance and coordination.  Psych: Patient has a history of anxiety.  Denies depression, SI/HI.  No other specific complaints in a complete review of systems  (except as listed in HPI above).  Objective:   Physical Exam  BP 122/78   Pulse 86   Ht 5\' 10"  (1.778 m)   Wt 212 lb (96.2 kg)   SpO2 98%   BMI 30.42 kg/m   Wt Readings from Last 3 Encounters:  10/19/22 214 lb (97.1 kg)  09/21/22 214 lb (97.1 kg)  12/14/21 215 lb (97.5 kg)    General: Appears his stated age, obese, in NAD. Skin: Warm, dry and intact. No ulcerations noted. HEENT: Head: normal shape and size; Eyes: sclera white, no icterus, conjunctiva pink, PERRLA and EOMs intact;  Neck:  Neck supple, trachea midline. No masses, lumps or thyromegaly present.  Cardiovascular: Normal rate and rhythm. S1,S2 noted.  No murmur, rubs or gallops noted. No JVD or BLE edema. No carotid bruits noted. Pulmonary/Chest: Normal effort and positive vesicular breath sounds.  No respiratory distress. No wheezes, rales or ronchi noted.  Abdomen: Normal bowel sounds.  Musculoskeletal: Decreased abduction of the left hip, normal abduction.  Decreased internal rotation of the left hip but normal external rotation of left hip.  No pain with palpation of the left hip.  Strength 5/5 BUE/BLE. No difficulty with gait.  Neurological: Alert and oriented. Cranial nerves II-XII grossly intact. Coordination normal.  Psychiatric: Mood and affect normal. Behavior is normal. Judgment and thought content normal.     BMET    Component Value Date/Time   NA 138 09/21/2022 1045   K 4.7 09/21/2022 1045   CL 102 09/21/2022 1045   CO2 27 09/21/2022 1045   GLUCOSE 103 (H) 09/21/2022 1045   BUN 15 09/21/2022 1045   CREATININE 1.11 09/21/2022 1045   CALCIUM 10.0 09/21/2022 1045   GFRNONAA 59 (L) 09/12/2017 0355   GFRAA >60 09/12/2017 0355    Lipid Panel     Component Value Date/Time   CHOL 177 09/21/2022 1045   TRIG 147 09/21/2022 1045   HDL 48 09/21/2022 1045   CHOLHDL 3.7 09/21/2022 1045   VLDL 19.4 12/17/2019 1124   LDLCALC 105 (H) 09/21/2022 1045    CBC    Component Value Date/Time   WBC 6.2  09/21/2022 1045   RBC 5.02 09/21/2022 1045   HGB 16.5 09/21/2022 1045   HCT 47.0 09/21/2022 1045   PLT 248 09/21/2022 1045   MCV 93.6 09/21/2022 1045   MCH 32.9 09/21/2022 1045   MCHC 35.1 09/21/2022 1045   RDW 13.5 09/21/2022 1045   LYMPHSABS 1.7 09/10/2017 0612   MONOABS 0.6 09/10/2017 0612   EOSABS 0.2 09/10/2017 0612   BASOSABS 0.1 09/10/2017 0612    Hgb A1C Lab Results  Component Value Date   HGBA1C 5.7 (H) 09/21/2022           Assessment & Plan:   Preventative health maintenance:  Encouraged him to get a flu shot in the fall He declines tetanus for financial reasons, advised him if he gets bit or cut to go get this done Encouraged him to get his COVID-vaccine He declines Pneumovax or Prevnar at this time Discussed Shingrix vaccine, he will check coverage with his insurance company schedule visit if he would like to have this done Cologuard ordered Encouraged him to consume a balanced diet and exercise regimen Advised him to see an eye doctor and dentist annually We will check CBC, c-Met, lipid, A1c and PSA today  Left hip pain:  X-ray left hip today Consider PT for further evaluation and treatment  RTC in 6 months, follow-up chronic conditions Nicki Reaper, NP

## 2023-05-09 LAB — COMPLETE METABOLIC PANEL WITH GFR
AG Ratio: 1.5 (calc) (ref 1.0–2.5)
AST: 24 U/L (ref 10–35)
Albumin: 4.8 g/dL (ref 3.6–5.1)
Alkaline phosphatase (APISO): 47 U/L (ref 35–144)
BUN: 15 mg/dL (ref 7–25)
CO2: 26 mmol/L (ref 20–32)
Chloride: 102 mmol/L (ref 98–110)
Creat: 1.32 mg/dL (ref 0.70–1.35)
Globulin: 3.1 g/dL (calc) (ref 1.9–3.7)
Glucose, Bld: 87 mg/dL (ref 65–139)
Potassium: 4 mmol/L (ref 3.5–5.3)
Sodium: 138 mmol/L (ref 135–146)
Total Bilirubin: 0.7 mg/dL (ref 0.2–1.2)
Total Protein: 7.9 g/dL (ref 6.1–8.1)
eGFR: 59 mL/min/{1.73_m2} — ABNORMAL LOW (ref >=60)

## 2023-05-09 LAB — HEMOGLOBIN A1C
Hgb A1c MFr Bld: 5.7 % of total Hgb — ABNORMAL HIGH (ref <5.7)
Mean Plasma Glucose: 117 mg/dL

## 2023-05-09 LAB — LIPID PANEL
Cholesterol: 178 mg/dL (ref <200)
LDL Cholesterol (Calc): 101 mg/dL (calc) — ABNORMAL HIGH
Non-HDL Cholesterol (Calc): 125 mg/dL (calc) (ref <130)
Total CHOL/HDL Ratio: 3.4 (calc) (ref <5.0)
Triglycerides: 139 mg/dL (ref <150)

## 2023-05-09 LAB — CBC
HCT: 46.1 % (ref 38.5–50.0)
RBC: 4.9 10*6/uL (ref 4.20–5.80)
WBC: 6.7 10*3/uL (ref 3.8–10.8)

## 2023-07-03 ENCOUNTER — Telehealth: Payer: Self-pay | Admitting: Internal Medicine

## 2023-07-03 NOTE — Telephone Encounter (Signed)
Patient stated he has been taking a whole tablet instead of half and it has been keeping his blood pressure where it needs to be Patient stated he will come in if need be but he may need another script. Please f/u with patient

## 2023-07-04 MED ORDER — LISINOPRIL-HYDROCHLOROTHIAZIDE 10-12.5 MG PO TABS: 1.0000 | ORAL_TABLET | Freq: Every day | ORAL | 1 refills | Status: DC

## 2023-07-04 NOTE — Addendum Note (Signed)
Addended by: Lorre Munroe on: 07/04/2023 07:52 AM   Modules accepted: Orders

## 2023-07-04 NOTE — Telephone Encounter (Signed)
Medication refilled

## 2023-07-16 ENCOUNTER — Other Ambulatory Visit: Payer: Self-pay | Admitting: Internal Medicine

## 2023-07-16 NOTE — Telephone Encounter (Signed)
Requested medication (s) are due for refill today: yes   Requested medication (s) are on the active medication list: yes   Last refill:  01/30/23 #128  refills  Future visit scheduled: yes in 3 months  Notes to clinic:  not delegated per protocol. Do you want to refill Rx?     Requested Prescriptions  Pending Prescriptions Disp Refills   methotrexate (RHEUMATREX) 2.5 MG tablet [Pharmacy Med Name: METHOTREXATE 2.5MG  TABLETS - YELLOW] 128 tablet 0    Sig: TAKE 10 TABLETS BY MOUTH ONCE A WEEK WHEN NEEDED FOR PSORIASIS FLARE.     Not Delegated - Immunology: Immunosuppressive Agents - methotrexate Failed - 07/16/2023  9:17 AM      Failed - This refill cannot be delegated      Failed - Albumin in normal range and within 360 days    Albumin  Date Value Ref Range Status  12/17/2019 4.4 3.5 - 5.2 g/dL Final         Passed - ALT in normal range and within 90 days    ALT  Date Value Ref Range Status  05/08/2023 22 9 - 46 U/L Final         Passed - AST in normal range and within 90 days    AST  Date Value Ref Range Status  05/08/2023 24 10 - 35 U/L Final         Passed - Cr in normal range and within 90 days    Creat  Date Value Ref Range Status  05/08/2023 1.32 0.70 - 1.35 mg/dL Final         Passed - HCT in normal range and within 90 days    HCT  Date Value Ref Range Status  05/08/2023 46.1 38.5 - 50.0 % Final         Passed - HGB in normal range and within 90 days    Hemoglobin  Date Value Ref Range Status  05/08/2023 15.4 13.2 - 17.1 g/dL Final         Passed - PLT in normal range and within 90 days    Platelets  Date Value Ref Range Status  05/08/2023 260 140 - 400 Thousand/uL Final         Passed - WBC in normal range and within 90 days    WBC  Date Value Ref Range Status  05/08/2023 6.7 3.8 - 10.8 Thousand/uL Final         Passed - eGFR is 10 or above and within 90 days    GFR calc Af Amer  Date Value Ref Range Status  09/12/2017 >60 >60 mL/min Final     Comment:    (NOTE) The eGFR has been calculated using the CKD EPI equation. This calculation has not been validated in all clinical situations. eGFR's persistently <60 mL/min signify possible Chronic Kidney Disease.    GFR calc non Af Amer  Date Value Ref Range Status  09/12/2017 59 (L) >60 mL/min Final   GFR  Date Value Ref Range Status  10/28/2020 72.21 >60.00 mL/min Final    Comment:    Calculated using the CKD-EPI Creatinine Equation (2021)   eGFR  Date Value Ref Range Status  05/08/2023 59 (L) > OR = 60 mL/min/1.51m2 Final         Passed - Patient is not pregnant      Passed - Valid encounter within last 3 months    Recent Outpatient Visits  2 months ago Encounter for general adult medical examination with abnormal findings   [place] [facility] [place], Salvadore Oxford, NP   9 months ago Atherosclerosis of both carotid arteries   [place] [facility] [place], Salvadore Oxford, NP   1 year ago Encounter for general adult medical examination with abnormal findings   [place] [facility] [place], Salvadore Oxford, NP   2 years ago Stage 3a chronic kidney disease [facility])   [place] [facility] [place], Salvadore Oxford, NP       Future Appointments             In 3 months Baity, Salvadore Oxford, NP [place] [facility], [facility]

## 2023-07-16 NOTE — Telephone Encounter (Signed)
Requested medications are due for refill today.  unsure  Requested medications are on the active medications list.  yes  Last refill. 01/30/2023  #128 0 rf  Future visit scheduled.   yes  Notes to clinic.  Refill not delegated.    Requested Prescriptions  Pending Prescriptions Disp Refills   methotrexate (RHEUMATREX) 2.5 MG tablet [Pharmacy Med Name: METHOTREXATE 2.5MG  TABLETS - YELLOW] 128 tablet 0    Sig: TAKE 10 TABLETS BY MOUTH ONCE A WEEK WHEN NEEDED FOR PSORIASIS FLARE.     Not Delegated - Immunology: Immunosuppressive Agents - methotrexate Failed - 07/16/2023  9:26 AM      Failed - This refill cannot be delegated      Failed - Albumin in normal range and within 360 days    Albumin  Date Value Ref Range Status  12/17/2019 4.4 3.5 - 5.2 g/dL Final         Passed - ALT in normal range and within 90 days    ALT  Date Value Ref Range Status  05/08/2023 22 9 - 46 U/L Final         Passed - AST in normal range and within 90 days    AST  Date Value Ref Range Status  05/08/2023 24 10 - 35 U/L Final         Passed - Cr in normal range and within 90 days    Creat  Date Value Ref Range Status  05/08/2023 1.32 0.70 - 1.35 mg/dL Final         Passed - HCT in normal range and within 90 days    HCT  Date Value Ref Range Status  05/08/2023 46.1 38.5 - 50.0 % Final         Passed - HGB in normal range and within 90 days    Hemoglobin  Date Value Ref Range Status  05/08/2023 15.4 13.2 - 17.1 g/dL Final         Passed - PLT in normal range and within 90 days    Platelets  Date Value Ref Range Status  05/08/2023 260 140 - 400 Thousand/uL Final         Passed - WBC in normal range and within 90 days    WBC  Date Value Ref Range Status  05/08/2023 6.7 3.8 - 10.8 Thousand/uL Final         Passed - eGFR is 10 or above and within 90 days    GFR calc Af Amer  Date Value Ref Range Status  09/12/2017 >60 >60 mL/min Final    Comment:    (NOTE) The eGFR has been  calculated using the CKD EPI equation. This calculation has not been validated in all clinical situations. eGFR's persistently <60 mL/min signify possible Chronic Kidney Disease.    GFR calc non Af Amer  Date Value Ref Range Status  09/12/2017 59 (L) >60 mL/min Final   GFR  Date Value Ref Range Status  10/28/2020 72.21 >60.00 mL/min Final    Comment:    Calculated using the CKD-EPI Creatinine Equation (2021)   eGFR  Date Value Ref Range Status  05/08/2023 59 (L) > OR = 60 mL/min/1.81m2 Final         Passed - Patient is not pregnant      Passed - Valid encounter within last 3 months    Recent Outpatient Visits           2 months ago Encounter for general adult medical examination  with abnormal findings   [employer] [facility] [place], Salvadore Oxford, NP   9 months ago Atherosclerosis of both carotid arteries   [place] [facility] [place], Salvadore Oxford, NP   1 year ago Encounter for general adult medical examination with abnormal findings   [place] [facility] [place], Salvadore Oxford, NP   2 years ago Stage 3a chronic kidney disease [facility])   [place] [facility] [place], Salvadore Oxford, NP       Future Appointments             In 3 months Baity, Salvadore Oxford, NP [place] [facility], [facility]

## 2023-07-29 ENCOUNTER — Other Ambulatory Visit: Payer: Self-pay | Admitting: Internal Medicine

## 2023-07-29 NOTE — Telephone Encounter (Signed)
Requested Prescriptions  Pending Prescriptions Disp Refills   levocetirizine (XYZAL) 5 MG tablet [Pharmacy Med Name: LEVOCETIRIZINE 5MG  TABLETS] 90 tablet 2    Sig: TAKE 1 TABLET BY MOUTH AT BEDTIME AS NEEDED FOR ALLERGIES     Ear, Nose, and Throat:  Antihistamines - levocetirizine dihydrochloride Passed - 07/29/2023  3:41 AM      Passed - Cr in normal range and within 360 days    Creat  Date Value Ref Range Status  05/08/2023 1.32 0.70 - 1.35 mg/dL Final         Passed - eGFR is 10 or above and within 360 days    GFR calc Af Amer  Date Value Ref Range Status  09/12/2017 >60 >60 mL/min Final    Comment:    (NOTE) The eGFR has been calculated using the CKD EPI equation. This calculation has not been validated in all clinical situations. eGFR's persistently <60 mL/min signify possible Chronic Kidney Disease.    GFR calc non Af Amer  Date Value Ref Range Status  09/12/2017 59 (L) >60 mL/min Final   GFR  Date Value Ref Range Status  10/28/2020 72.21 >60.00 mL/min Final    Comment:    Calculated using the CKD-EPI Creatinine Equation (2021)   eGFR  Date Value Ref Range Status  05/08/2023 59 (L) > OR = 60 mL/min/1.31m2 Final         Passed - Valid encounter within last 12 months    Recent Outpatient Visits           2 months ago Encounter for general adult medical examination with abnormal findings   [place] [facility] [place], Salvadore Oxford, NP   10 months ago Atherosclerosis of both carotid arteries   [place] [facility] [place], Salvadore Oxford, NP   1 year ago Encounter for general adult medical examination with abnormal findings   [place] [facility] [place], Salvadore Oxford, NP   2 years ago Stage 3a chronic kidney disease [facility])   [place] [facility] [place], Salvadore Oxford, NP       Future Appointments             In 3 months Baity, Salvadore Oxford, NP [place] [facility], [facility]

## 2023-08-06 ENCOUNTER — Other Ambulatory Visit: Payer: Self-pay | Admitting: Internal Medicine

## 2023-08-06 DIAGNOSIS — F5101 Primary insomnia: Secondary | ICD-10-CM

## 2023-08-06 NOTE — Telephone Encounter (Signed)
Medication Refill - Medication: traZODone (DESYREL) 100 MG   Has the patient contacted their pharmacy? No, pharmacy called pt and was told the request sent by them wasn't responded too   Preferred Pharmacy (with phone number or street name):  [id number] [place], [place] - [address] AT [facility] [address] Phone: [phone]  Fax: [phone]     Has the patient been seen for an appointment in the last year OR does the patient have an upcoming appointment? yes  Agent: Please be advised that RX refills may take up to 3 business days. We ask that you follow-up with your pharmacy.

## 2023-08-07 ENCOUNTER — Ambulatory Visit: Payer: Self-pay

## 2023-08-07 MED ORDER — TRAZODONE HCL 100 MG PO TABS: 100.0000 mg | ORAL_TABLET | Freq: Every day | ORAL | 0 refills | Status: DC

## 2023-08-07 NOTE — Telephone Encounter (Signed)
     Chief Complaint: Left shoulder and neck pain Symptoms: Above Frequency: 1 week ago Pertinent Negatives: Patient denies any injury Disposition: [] ED /[] Urgent Care (no appt availability in office) / [x] Appointment(In office/virtual)/ []  [place] [facility]/ [] Home Care/ [] Refused Recommended Disposition /[] [place] [facility]/ []  Follow-up with PCP Additional Notes: Agrees with appointment. Has tried OTC.   Reason for Disposition  [1] MODERATE pain (e.g., interferes with normal activities) AND [2] present > 3 days  Answer Assessment - Initial Assessment Questions 1. ONSET: "When did the pain start?"     1 week ago 2. LOCATION: "Where is the pain located?"     Left shoulder and neck 3. PAIN: "How bad is the pain?" (Scale 1-10; or mild, moderate, severe)   - MILD (1-3): doesn't interfere with normal activities   - MODERATE (4-7): interferes with normal activities (e.g., work or school) or awakens from sleep   - SEVERE (8-10): excruciating pain, unable to do any normal activities, unable to move arm at all due to pain     1 with rest, with movement 8 4. WORK OR EXERCISE: "Has there been any recent work or exercise that involved this part of the body?"     No 5. CAUSE: "What do you think is causing the shoulder pain?"     Unsure 6. OTHER SYMPTOMS: "Do you have any other symptoms?" (e.g., neck pain, swelling, rash, fever, numbness, weakness)     No 7. PREGNANCY: "Is there any chance you are pregnant?" "When was your last menstrual period?"     N/a  Protocols used: Shoulder Pain-A-AH

## 2023-08-07 NOTE — Telephone Encounter (Signed)
Requested Prescriptions  Pending Prescriptions Disp Refills   traZODone (DESYREL) 100 MG tablet 180 tablet 0    Sig: TAKE 2 TABLETS(200 MG) BY MOUTH AT BEDTIME     Psychiatry: Antidepressants - Serotonin Modulator Passed - 08/06/2023  2:55 PM      Passed - Valid encounter within last 6 months    Recent Outpatient Visits           3 months ago Encounter for general adult medical examination with abnormal findings   [place] [facility] [place], Salvadore Oxford, NP   10 months ago Atherosclerosis of both carotid arteries   [place] [facility] [place], Salvadore Oxford, NP   1 year ago Encounter for general adult medical examination with abnormal findings   [place] [facility] [place], Salvadore Oxford, NP   2 years ago Stage 3a chronic kidney disease [facility])   [place] [facility] [place], Salvadore Oxford, NP       Future Appointments             Tom[place], Salvadore Oxford, NP [place] [facility], [facility]   In 3 months [place], Salvadore Oxford, NP [facility], [facility]

## 2023-08-08 ENCOUNTER — Telehealth: Payer: Self-pay

## 2023-08-08 ENCOUNTER — Encounter: Payer: Self-pay | Admitting: Internal Medicine

## 2023-08-08 ENCOUNTER — Ambulatory Visit: Payer: Medicare HMO | Admitting: Internal Medicine

## 2023-08-08 VITALS — BP 136/84 | Ht 70.0 in | Wt 223.0 lb

## 2023-08-08 DIAGNOSIS — S46811A Strain of other muscles, fascia and tendons at shoulder and upper arm level, right arm, initial encounter: Secondary | ICD-10-CM | POA: Diagnosis not present

## 2023-08-08 DIAGNOSIS — M542 Cervicalgia: Secondary | ICD-10-CM

## 2023-08-08 MED ORDER — METHOCARBAMOL 500 MG PO TABS: 500.0000 mg | ORAL_TABLET | Freq: Three times a day (TID) | ORAL | 0 refills | Status: DC | PRN

## 2023-08-08 NOTE — Progress Notes (Signed)
Subjective:    Patient ID: Peter Rose, male    DOB: Jan 05, 1956, 67 y.o.   MRN: [id number]  HPI  Discussed the use of AI scribe software for clinical note transcription with the patient, who gave verbal consent to proceed.  The patient presents with a chief complaint of severe right-sided neck pain, specifically in the trapezius muscle, which began last Thursday. The pain was triggered by an awkward turn of the neck, but no popping sound or sensation was reported. The pain is described as intense, at times reaching a level that induces a desire to cry. The discomfort is characterized as a tightness that radiates to the back of the neck. The patient notes that the pain is alleviated when the arm is propped up, but worsens when the arm is hanging or dangling.  The patient has attempted to manage the pain with patches and roll-ons, but these interventions have not provided significant relief. The pain is so severe that it has interfered with the patient's ability to drive, necessitating stops at rest areas to stretch and apply tension to the area. The patient reports that the pain fluctuates in intensity, at times being barely noticeable, and at other times being extremely severe.  The patient has a history of similar episodes, but this instance has been particularly persistent and has not shown signs of improvement. The patient denies any associated symptoms such as headaches, dizziness, vision changes, numbness, tingling, or weakness in the right arm. The pain appears to be purely muscular in nature, with no reported bone pain in the neck.       Review of Systems     Past Medical History:  Diagnosis Date   Complication of anesthesia    abdominal distention (brief ileus ves opioid induced constipation) following 09/10/17 knee arthroscopy   COVID    GERD (gastroesophageal reflux disease)    occasional   Medical history non-contributory    Psoriasis     Current Outpatient Medications   Medication Sig Dispense Refill   aspirin EC 81 MG tablet Take 1 tablet (81 mg total) by mouth daily. Swallow whole. 30 tablet 12   ibuprofen (ADVIL) 200 MG tablet Take 600-800 mg by mouth every 6 (six) hours as needed for moderate pain.     levocetirizine (XYZAL) 5 MG tablet TAKE 1 TABLET BY MOUTH AT BEDTIME AS NEEDED FOR ALLERGIES 90 tablet 2   lisinopril-hydrochlorothiazide (ZESTORETIC) 10-12.5 MG tablet Take 1 tablet by mouth daily. 90 tablet 1   methotrexate (RHEUMATREX) 2.5 MG tablet TAKE 10 TABLETS BY MOUTH ONCE A WEEK WHEN NEEDED FOR PSORIASIS FLARE. 128 tablet 0   sildenafil (VIAGRA) 100 MG tablet TAKE 1 TABLET BY MOUTH AS NEEDED FOR ERECTILE DYSFUNCTION 10 tablet 3   traZODone (DESYREL) 100 MG tablet Take 1 tablet (100 mg total) by mouth at bedtime. TAKE 2 TABLETS(200 MG) BY MOUTH AT BEDTIME 180 tablet 0   No current facility-administered medications for this visit.    Allergies  Allergen Reactions   Codeine Other (See Comments)    feels wired and jittery   Hycodan [Hydrocodone Bit-Homatrop Mbr]     Makes pt feel weird/jittery   Vicodin [Hydrocodone-Acetaminophen] Other (See Comments)    Pt feels "wired" with pain meds    Family History  Problem Relation Age of Onset   Colon cancer Neg Hx    Prostate cancer Neg Hx     Social History   Socioeconomic History   Marital status: Single    Spouse  name: Not on file   Number of children: 3   Years of education: 29   Highest education level: Not on file  Occupational History   Occupation: [employer]  Tobacco Use   Smoking status: Former    Current packs/day: 0.00    Types: Cigarettes    Quit date: 01/06/2017    Years since quitting: 6.5   Smokeless tobacco: Never  Vaping Use   Vaping status: Never Used  Substance and Sexual Activity   Alcohol use: Yes    Comment: occasional   Drug use: No   Sexual activity: Not on file  Other Topics Concern   Not on file  Social History Narrative   Lives alone   Caffeine use:   2 cups - coffee   Tea daily   Social Determinants of Health   Financial Resource Strain: Low Risk  (10/19/2022)   Overall Financial Resource Strain (CARDIA)    Difficulty of Paying Living Expenses: Not hard at all  Food Insecurity: No Food Insecurity (10/19/2022)   Hunger Vital Sign    Worried About Running Out of Food in the Last Year: Never true    Ran Out of Food in the Last Year: Never true  Transportation Needs: No Transportation Needs (10/19/2022)   PRAPARE - Administrator, Civil Service (Medical): No    Lack of Transportation (Non-Medical): No  Physical Activity: Inactive (10/19/2022)   Exercise Vital Sign    Days of Exercise per Week: 0 days    Minutes of Exercise per Session: 0 min  Stress: No Stress Concern Present (10/19/2022)   [employer] of Occupational Health - Occupational Stress Questionnaire    Feeling of Stress : Not at all  Social Connections: Socially Isolated (10/19/2022)   Social Connection and Isolation Panel [NHANES]    Frequency of Communication with Friends and Family: More than three times a week    Frequency of Social Gatherings with Friends and Family: More than three times a week    Attends Religious Services: Never    Database administrator or Organizations: No    Attends Banker Meetings: Never    Marital Status: Divorced  Catering manager Violence: Not At Risk (10/19/2022)   Humiliation, Afraid, Rape, and Kick questionnaire    Fear of Current or Ex-Partner: No    Emotionally Abused: No    Physically Abused: No    Sexually Abused: No     Constitutional: Denies fever, malaise, fatigue, headache or abrupt weight changes.  Respiratory: Denies difficulty breathing, shortness of breath, cough or sputum production.   Cardiovascular: Denies chest pain, chest tightness, palpitations or swelling in the hands or feet.  Musculoskeletal: Pt reports chronic back pain, acute right-sided neck pain. Denies decrease in range of motion,  difficulty with gait, or joint swelling.  Skin: Denies redness, rashes, lesions or ulcercations.  Neurological: Pt reports insomnia. Denies numbness, tingling, weakness or problems with balance and coordination.  Psych: Pt has a history of anxiety. Denies depression, SI/HI.  No other specific complaints in a complete review of systems (except as listed in HPI above).  Objective:   Physical Exam  BP 136/84   Ht 5\' 10"  (1.778 m)   Wt 223 lb (101.2 kg)   BMI 32.00 kg/m   Wt Readings from Last 3 Encounters:  05/08/23 212 lb (96.2 kg)  10/19/22 214 lb (97.1 kg)  09/21/22 214 lb (97.1 kg)    General: Appears his stated age, obese, in NAD.  Skin: Warm, dry and intact. No rashesnoted. HEENT: Head: normal shape and size; Eyes: sclera white, no icterus, conjunctiva pink, PERRLA and EOMs intact;  Cardiovascular: Normal rate and rhythm. S1,S2 noted.  No murmur, rubs or gallops noted.  Pulmonary/Chest: Normal effort and positive vesicular breath sounds. No respiratory distress. No wheezes, rales or ronchi noted.  Musculoskeletal: Decreased extension of the cervical spine.  Normal flexion, rotation and lateral bending of the cervical spine.  No bony tenderness noted over the cervical spine.  Pain with palpation noted of the right paracervical muscles and upper trapezius.  Normal internal and external rotation of the right shoulder.  Strength 5/5 BUE.  Handgrips equal.  No difficulty with gait.  Neurological: Alert and oriented. Coordination normal.    BMET    Component Value Date/Time   NA 138 05/08/2023 1424   K 4.0 05/08/2023 1424   CL 102 05/08/2023 1424   CO2 26 05/08/2023 1424   GLUCOSE 87 05/08/2023 1424   BUN 15 05/08/2023 1424   CREATININE 1.32 05/08/2023 1424   CALCIUM 9.8 05/08/2023 1424   GFRNONAA 59 (L) 09/12/2017 0355   GFRAA >60 09/12/2017 0355    Lipid Panel     Component Value Date/Time   CHOL 178 05/08/2023 1424   TRIG 139 05/08/2023 1424   HDL 53 05/08/2023 1424    CHOLHDL 3.4 05/08/2023 1424   VLDL 19.4 12/17/2019 1124   LDLCALC 101 (H) 05/08/2023 1424    CBC    Component Value Date/Time   WBC 6.7 05/08/2023 1424   RBC 4.90 05/08/2023 1424   HGB 15.4 05/08/2023 1424   HCT 46.1 05/08/2023 1424   PLT 260 05/08/2023 1424   MCV 94.1 05/08/2023 1424   MCH 31.4 05/08/2023 1424   MCHC 33.4 05/08/2023 1424   RDW 14.5 05/08/2023 1424   LYMPHSABS 1.7 09/10/2017 0612   MONOABS 0.6 09/10/2017 0612   EOSABS 0.2 09/10/2017 0612   BASOSABS 0.1 09/10/2017 0612    Hgb A1C Lab Results  Component Value Date   HGBA1C 5.7 (H) 05/08/2023           Assessment & Plan:  Assessment and Plan    Right-sided trapezius muscle strain Severe pain, exacerbated by movement and relieved by arm support. No associated neurological symptoms. Physical examination consistent with muscle strain. -Prescribe Methocarbamol 500mg  every 8 hours as needed for pain. Cautioned about potential drowsiness and advised not to take when driving or working. -Recommend heat application and massage. -If no improvement by 08/09/2023, patient to contact office. -Consider neck x-ray if symptoms persist, although current presentation appears muscular in nature.     RTC in 3 months, follow up chronic conditions Nicki Reaper, NP

## 2023-08-08 NOTE — Patient Instructions (Signed)
Neck Exercises Ask your health care provider which exercises are safe for you. Do exercises exactly as told by your health care provider and adjust them as directed. It is normal to feel mild stretching, pulling, tightness, or discomfort as you do these exercises. Stop right away if you feel sudden pain or your pain gets worse. Do not begin these exercises until told by your health care provider. Neck exercises can be important for many reasons. They can improve strength and maintain flexibility in your neck, which will help your upper back and prevent neck pain. Stretching exercises Rotation neck stretching  Sit in a chair or stand up. Place your feet flat on the floor, shoulder-width apart. Slowly turn your head (rotate) to the right until a slight stretch is felt. Turn it all the way to the right so you can look over your right shoulder. Do not tilt or tip your head. Hold this position for 10-30 seconds. Slowly turn your head (rotate) to the left until a slight stretch is felt. Turn it all the way to the left so you can look over your left shoulder. Do not tilt or tip your head. Hold this position for 10-30 seconds. Repeat __________ times. Complete this exercise __________ times a day. Neck retraction  Sit in a sturdy chair or stand up. Look straight ahead. Do not bend your neck. Use your fingers to push your chin backward (retraction). Do not bend your neck for this movement. Continue to face straight ahead. If you are doing the exercise properly, you will feel a slight sensation in your throat and a stretch at the back of your neck. Hold the stretch for 1-2 seconds. Repeat __________ times. Complete this exercise __________ times a day. Strengthening exercises Neck press  Lie on your back on a firm bed or on the floor with a pillow under your head. Use your neck muscles to push your head down on the pillow and straighten your spine. Hold the position as well as you can. Keep your head  facing up (in a neutral position) and your chin tucked. Slowly count to 5 while holding this position. Repeat __________ times. Complete this exercise __________ times a day. Isometrics These are exercises in which you strengthen the muscles in your neck while keeping your neck still (isometrics). Sit in a supportive chair and place your hand on your forehead. Keep your head and face facing straight ahead. Do not flex or extend your neck while doing isometrics. Push forward with your head and neck while pushing back with your hand. Hold for 10 seconds. Do the sequence again, this time putting your hand against the back of your head. Use your head and neck to push backward against the hand pressure. Finally, do the same exercise on either side of your head, pushing sideways against the pressure of your hand. Repeat __________ times. Complete this exercise __________ times a day. Prone head lifts  Lie face-down (prone position), resting on your elbows so that your chest and upper back are raised. Start with your head facing downward, near your chest. Position your chin either on or near your chest. Slowly lift your head upward. Lift until you are looking straight ahead. Then continue lifting your head as far back as you can comfortably stretch. Hold your head up for 5 seconds. Then slowly lower it to your starting position. Repeat __________ times. Complete this exercise __________ times a day. Supine head lifts  Lie on your back (supine position), bending your knees   to point to the ceiling and keeping your feet flat on the floor. Lift your head slowly off the floor, raising your chin toward your chest. Hold for 5 seconds. Repeat __________ times. Complete this exercise __________ times a day. Scapular retraction  Stand with your arms at your sides. Look straight ahead. Slowly pull both shoulders (scapulae) backward and downward (retraction) until you feel a stretch between your shoulder  blades in your upper back. Hold for 10-30 seconds. Relax and repeat. Repeat __________ times. Complete this exercise __________ times a day. Contact a health care provider if: Your neck pain or discomfort gets worse when you do an exercise. Your neck pain or discomfort does not improve within 2 hours after you exercise. If you have any of these problems, stop exercising right away. Do not do the exercises again unless your health care provider says that you can. Get help right away if: You develop sudden, severe neck pain. If this happens, stop exercising right away. Do not do the exercises again unless your health care provider says that you can. This information is not intended to replace advice given to you by your health care provider. Make sure you discuss any questions you have with your health care provider. Document Revised: 03/28/2021 Document Reviewed: 03/28/2021 Elsevier Patient Education  2024 Elsevier Inc.  

## 2023-08-08 NOTE — Telephone Encounter (Signed)
Patient called the office that there is a problem with his Trazadone and we need to call the pharmacy.  There are 2 sigs listed on Trazadone prescription - take 1 tablet at bedtime and take 2 tablets at bedtime.  Clarified with Rene Kocher that the correct sig is take 2 tablets at bedtime  I called [facility] pharmacy and updated the prescription sig.

## 2023-09-01 ENCOUNTER — Other Ambulatory Visit: Payer: Self-pay | Admitting: Internal Medicine

## 2023-09-02 NOTE — Telephone Encounter (Signed)
Requested Prescriptions  Pending Prescriptions Disp Refills   sildenafil (VIAGRA) 100 MG tablet [Pharmacy Med Name: SILDENAFIL 100MG  TABLETS] 10 tablet 3    Sig: TAKE 1 TABLET BY MOUTH AS NEEDED FOR ERECTILE DYSFUNCTION     Urology: Erectile Dysfunction Agents Passed - 09/01/2023  1:40 PM      Passed - AST in normal range and within 360 days    AST  Date Value Ref Range Status  05/08/2023 24 10 - 35 U/L Final         Passed - ALT in normal range and within 360 days    ALT  Date Value Ref Range Status  05/08/2023 22 9 - 46 U/L Final         Passed - Last BP in normal range    BP Readings from Last 1 Encounters:  08/08/23 136/84         Passed - Valid encounter within last 12 months    Recent Outpatient Visits           3 weeks ago Neck pain on right side   [facility] [place], Salvadore Oxford, NP   3 months ago Encounter for general adult medical examination with abnormal findings   [place] [facility] [place], Salvadore Oxford, NP   11 months ago Atherosclerosis of both carotid arteries   [facility] [place], Salvadore Oxford, NP   1 year ago Encounter for general adult medical examination with abnormal findings   [facility] [place], Salvadore Oxford, NP   2 years ago Stage 3a chronic kidney disease [facility])   [place] [facility] [place], Salvadore Oxford, NP       Future Appointments             In 2 months [place], Salvadore Oxford, NP [facility], [facility]

## 2023-10-17 ENCOUNTER — Ambulatory Visit: Payer: Self-pay | Admitting: *Deleted

## 2023-10-17 NOTE — Telephone Encounter (Signed)
  Chief Complaint: low BP dizziness  Symptoms: BP low today and paitent did not go to work. Does take zestoretic  daily. 1 pm BP 83/53 pulse 73.  4 pm BP 96/67 pulse 67. 417 pm BP 110/67 pulse 61 424 pm BP 109/70 pulse 64.  Dizziness and lightheaded . Has reported recent cold sx last week 10/05/23.  Frequency: today  Pertinent Negatives: Patient denies chest pain no difficulty breathing no fever reported  Disposition: [] ED /[] Urgent Care (no appt availability in office) / [] Appointment(In office/virtual)/ []  [facility]/ [] Home Care/ [] Refused Recommended Disposition /[] [place] [facility]/ [x]  Follow-up with PCP Additional Notes:   No available appt until Jan 6. Recommended ED if BP drops again. Increase water intake and eat. Patient would like call back if he can be seen tomorrow and if he is to take his zestoretic  tomorrow morning. Please advise.       Reason for Disposition  [1] Systolic BP 90-110 AND [2] taking blood pressure medications AND [3] dizzy, lightheaded or weak  Answer Assessment - Initial Assessment Questions 1. BLOOD PRESSURE: What is the blood pressure? Did you take at least two measurements 5 minutes apart?     BP 110/67 pulse 61 at 4:17 pm and recheck BP 109/70 pulse 64 at 424 pm 2. ONSET: When did you take your blood pressure?     Now  3. HOW: How did you obtain the blood pressure? (e.g., visiting nurse, automatic home BP monitor)     Automatic BP monitor 4. HISTORY: Do you have a history of low blood pressure? What is your blood pressure normally?     na 5. MEDICINES: Are you taking any medications for blood pressure? If Yes, ask: Have they been changed recently?     Yes zestoretic   6. PULSE RATE: Do you know what your pulse rate is?      61 7. OTHER SYMPTOMS: Have you been sick recently? Have you had a recent injury?     Recent cold sx last week 10/06/23.  Feels dizzy lightheaded today . 8. PREGNANCY: Is there any chance you  are pregnant? When was your last menstrual period?     na  Protocols used: Blood Pressure - Low-A-AH

## 2023-10-18 ENCOUNTER — Ambulatory Visit: Payer: Self-pay | Admitting: *Deleted

## 2023-10-18 NOTE — Telephone Encounter (Signed)
 He needs to schedule an appointment for further evaluation

## 2023-10-18 NOTE — Telephone Encounter (Signed)
  Chief Complaint: dizziness, low BP Symptoms: 102/63, headache Frequency: continues this am  Pertinent Negatives: Patient denies diarrhea, bleeding, vomiting Disposition: [] ED /[] Urgent Care (no appt availability in office) / [] Appointment(In office/virtual)/ []  [place] [facility]/ [] Home Care/ [] Refused Recommended Disposition /[] [place] [facility]/ [x]  Follow-up with PCP Additional Notes: pt was advised to hold Zestoretic . He has not taken any of the BP he has prescribed. Pt seeking advice for what to do next. Advised pt to call back if he worsens. Pt wanting to know if he needs to continue holding the  Reason for Disposition  [1] Systolic BP 90-110 AND [2] taking blood pressure medications AND [3] dizzy, lightheaded or weak  Answer Assessment - Initial Assessment Questions 1. BLOOD PRESSURE: What is the blood pressure? Did you take at least two measurements 5 minutes apart?     02/63 2. ONSET: When did you take your blood pressure?     Yesterday  3. HOW: How did you obtain the blood pressure? (e.g., visiting nurse, automatic home BP monitor)     *No Answer* 4. HISTORY: Do you have a history of low blood pressure? What is your blood pressure normally?     *No Answer* 5. MEDICINES: Are you taking any medications for blood pressure? If Yes, ask: Have they been changed recently?     *No Answer* 6. PULSE RATE: Do you know what your pulse rate is?      64 7. OTHER SYMPTOMS: Have you been sick recently? Have you had a recent injury?     *No Answer* 8. PREGNANCY: Is there any chance you are pregnant? When was your last menstrual period?     *No Answer*  Protocols used: Blood Pressure - Low-A-AH

## 2023-10-18 NOTE — Telephone Encounter (Signed)
 Scheduled for Monday with [facility]. Advised ED if symptoms worsen over the weekend. Verbalized understanding.

## 2023-10-18 NOTE — Telephone Encounter (Signed)
 Called patient to review message from PCP this am , no answer, LVMTCB Chief Complaint:  message regarding BP medication ,  Message from PCP to hold zestoretic  until BP > 120/80.  Symptoms: na Frequency: na Pertinent Negatives: Patient denies na Disposition: [] ED /[] Urgent Care (no appt availability in office) / [] Appointment(In office/virtual)/ []  [place] [facility]/ [] Home Care/ [] Refused Recommended Disposition /[] [place] [facility]/ [x]  Follow-up with PCP Additional Notes: na    Reason for Disposition  [1] Follow-up call to recent contact AND [2] information only call, no triage required  Answer Assessment - Initial Assessment Questions 1. REASON FOR CALL or QUESTION: What is your reason for calling today? or How can I best help you? or What question do you have that I can help answer?     Called patient to review message from PCP to hold BP medication until BP > 120/80.  Protocols used: Information Only Call - No Triage-A-AH

## 2023-10-21 ENCOUNTER — Encounter: Payer: Self-pay | Admitting: Internal Medicine

## 2023-10-21 ENCOUNTER — Ambulatory Visit (INDEPENDENT_AMBULATORY_CARE_PROVIDER_SITE_OTHER): Payer: Medicare HMO | Admitting: Internal Medicine

## 2023-10-21 VITALS — BP 138/84 | Ht 70.0 in | Wt 212.6 lb

## 2023-10-21 DIAGNOSIS — R42 Dizziness and giddiness: Secondary | ICD-10-CM | POA: Diagnosis not present

## 2023-10-21 DIAGNOSIS — F5101 Primary insomnia: Secondary | ICD-10-CM

## 2023-10-21 DIAGNOSIS — I10 Essential (primary) hypertension: Secondary | ICD-10-CM | POA: Diagnosis not present

## 2023-10-21 DIAGNOSIS — M4726 Other spondylosis with radiculopathy, lumbar region: Secondary | ICD-10-CM

## 2023-10-21 DIAGNOSIS — L409 Psoriasis, unspecified: Secondary | ICD-10-CM | POA: Diagnosis not present

## 2023-10-21 DIAGNOSIS — E559 Vitamin D deficiency, unspecified: Secondary | ICD-10-CM

## 2023-10-21 DIAGNOSIS — E6609 Other obesity due to excess calories: Secondary | ICD-10-CM

## 2023-10-21 DIAGNOSIS — I959 Hypotension, unspecified: Secondary | ICD-10-CM

## 2023-10-21 DIAGNOSIS — R7303 Prediabetes: Secondary | ICD-10-CM

## 2023-10-21 DIAGNOSIS — F419 Anxiety disorder, unspecified: Secondary | ICD-10-CM

## 2023-10-21 DIAGNOSIS — I6529 Occlusion and stenosis of unspecified carotid artery: Secondary | ICD-10-CM | POA: Diagnosis not present

## 2023-10-21 DIAGNOSIS — R5383 Other fatigue: Secondary | ICD-10-CM | POA: Diagnosis not present

## 2023-10-21 DIAGNOSIS — Z6830 Body mass index (BMI) 30.0-30.9, adult: Secondary | ICD-10-CM

## 2023-10-21 DIAGNOSIS — E66811 Obesity, class 1: Secondary | ICD-10-CM

## 2023-10-21 DIAGNOSIS — N522 Drug-induced erectile dysfunction: Secondary | ICD-10-CM

## 2023-10-21 MED ORDER — PREDNISONE 10 MG PO TABS: ORAL_TABLET | ORAL | 0 refills | Status: DC

## 2023-10-21 NOTE — Assessment & Plan Note (Signed)
 Currently not an issue

## 2023-10-21 NOTE — Assessment & Plan Note (Signed)
 He will continue to hold lisinoprol hct unless BP > 130/80 Reinforced DASH diet and exercise for weight loss C-Met today

## 2023-10-21 NOTE — Assessment & Plan Note (Signed)
 Lipid profile today Not currently on statin therapy Continue aspirin

## 2023-10-21 NOTE — Assessment & Plan Note (Signed)
 Continue sildenafil as needed.

## 2023-10-21 NOTE — Assessment & Plan Note (Signed)
Continue methotrexate as needed

## 2023-10-21 NOTE — Assessment & Plan Note (Signed)
 Encouraged diet and exercise for weight loss ?

## 2023-10-21 NOTE — Patient Instructions (Signed)
 Fatigue If you have fatigue, you feel tired all the time and have a lack of energy or a lack of motivation. Fatigue may make it difficult to start or complete tasks because of exhaustion. Occasional or mild fatigue is often a normal response to activity or life. However, long-term (chronic) or extreme fatigue may be a symptom of a medical condition such as: Depression. Not having enough red blood cells or hemoglobin in the blood (anemia). A problem with a small gland located in the lower front part of the neck (thyroid disorder). Rheumatologic conditions. These are problems related to the body's defense system (immune system). Infections, especially certain viral infections. Fatigue can also lead to negative health outcomes over time. Follow these instructions at home: Medicines Take over-the-counter and prescription medicines only as told by your health care provider. Take a multivitamin if told by your health care provider. Do not use herbal or dietary supplements unless they are approved by your health care provider. Eating and drinking  Avoid heavy meals in the evening. Eat a well-balanced diet, which includes lean proteins, whole grains, plenty of fruits and vegetables, and low-fat dairy products. Avoid eating or drinking too many products with caffeine in them. Avoid alcohol. Drink enough fluid to keep your urine pale yellow. Activity  Exercise regularly, as told by your health care provider. Use or practice techniques to help you relax, such as yoga, tai chi, meditation, or massage therapy. Lifestyle Change situations that cause you stress. Try to keep your work and personal schedules in balance. Do not use recreational or illegal drugs. General instructions Monitor your fatigue for any changes. Go to bed and get up at the same time every day. Avoid fatigue by pacing yourself during the day and getting enough sleep at night. Maintain a healthy weight. Contact a health care  provider if: Your fatigue does not get better. You have a fever. You suddenly lose or gain weight. You have headaches. You have trouble falling asleep or sleeping through the night. You feel angry, guilty, anxious, or sad. You have swelling in your legs or another part of your body. Get help right away if: You feel confused, feel like you might faint, or faint. Your vision is blurry or you have a severe headache. You have severe pain in your abdomen, your back, or the area between your waist and hips (pelvis). You have chest pain, shortness of breath, or an irregular or fast heartbeat. You are unable to urinate, or you urinate less than normal. You have abnormal bleeding from the rectum, nose, lungs, nipples, or, if you are male, the vagina. You vomit blood. You have thoughts about hurting yourself or others. These symptoms may be an emergency. Get help right away. Call 911. Do not wait to see if the symptoms will go away. Do not drive yourself to the hospital. Get help right away if you feel like you may hurt yourself or others, or have thoughts about taking your own life. Go to your nearest emergency room or: Call 911. Call the National Suicide Prevention Lifeline at (262)721-8699 or 988. This is open 24 hours a day. Text the Crisis Text Line at 8450584327. Summary If you have fatigue, you feel tired all the time and have a lack of energy or a lack of motivation. Fatigue may make it difficult to start or complete tasks because of exhaustion. Long-term (chronic) or extreme fatigue may be a symptom of a medical condition. Exercise regularly, as told by your health care provider.  Change situations that cause you stress. Try to keep your work and personal schedules in balance. This information is not intended to replace advice given to you by your health care provider. Make sure you discuss any questions you have with your health care provider. Document Revised: 07/24/2021 Document  Reviewed: 07/24/2021 Elsevier Patient Education  2024 [employer].

## 2023-10-21 NOTE — Progress Notes (Signed)
 Subjective:    Patient ID: Peter Rose, male    DOB: 1955/11/15, 68 y.o.   MRN: [id number]  HPI  Patient presents the clinic today for follow-up of chronic conditions.  HTN: His BP today is 138/84.  He reports he has been having low blood pressures associated with lightheadedness.  He has been holding his lisinopril  hct .  ECG from 09/2020 reviewed.  HLD with carotid atherosclerosis: His last LDL was 101, triglycerides 139, 04/2023.  He is not taking any cholesterol-lowering medication at this time. He is taking a baby aspirin . He does not consume low-fat diet.  Lumbar cadiculopathy: Currently not an issue.  He is not taking any medications for this at this time.  He does not follow with neurosurgery.  Psoriasis: Managed on methotrexate  as needed.  He follows with dermatology.  Anxiety: Currently not an issue.  He is not taking any medications or seeing a therapist at this time.  He denies depression, SI/HI.  Insomnia: He has difficulty staying asleep.  He takes trazodone  as prescribed.  There is no sleep study on file.  ED: Managed with sildenafil  as needed.  He does not follow with urology.  Prediabetes: His last A1c was 5.7%, 04/2023.  He is not taking any oral diabetic medication at this time.  He does not check his sugars.  Review of Systems  Past Medical History:  Diagnosis Date   Complication of anesthesia    abdominal distention (brief ileus ves opioid induced constipation) following 09/10/17 knee arthroscopy   COVID    GERD (gastroesophageal reflux disease)    occasional   Medical history non-contributory    Psoriasis     Current Outpatient Medications  Medication Sig Dispense Refill   aspirin  EC 81 MG tablet Take 1 tablet (81 mg total) by mouth daily. Swallow whole. 30 tablet 12   ibuprofen  (ADVIL ) 200 MG tablet Take 600-800 mg by mouth every 6 (six) hours as needed for moderate pain.     levocetirizine (XYZAL ) 5 MG tablet TAKE 1 TABLET BY MOUTH AT BEDTIME AS  NEEDED FOR ALLERGIES 90 tablet 2   lisinopril -hydrochlorothiazide  (ZESTORETIC ) 10-12.5 MG tablet Take 1 tablet by mouth daily. 90 tablet 1   methocarbamol  (ROBAXIN ) 500 MG tablet Take 1 tablet (500 mg total) by mouth every 8 (eight) hours as needed. 30 tablet 0   methotrexate  (RHEUMATREX) 2.5 MG tablet TAKE 10 TABLETS BY MOUTH ONCE A WEEK WHEN NEEDED FOR PSORIASIS FLARE. 128 tablet 0   sildenafil  (VIAGRA ) 100 MG tablet TAKE 1 TABLET BY MOUTH AS NEEDED FOR ERECTILE DYSFUNCTION 10 tablet 3   traZODone  (DESYREL ) 100 MG tablet Take 1 tablet (100 mg total) by mouth at bedtime. TAKE 2 TABLETS(200 MG) BY MOUTH AT BEDTIME 180 tablet 0   No current facility-administered medications for this visit.    Allergies  Allergen Reactions   Codeine Other (See Comments)    feels wired and jittery   Hycodan [Hydrocodone  Bit-Homatrop Mbr]     Makes pt feel weird/jittery   Vicodin [Hydrocodone -Acetaminophen ] Other (See Comments)    Pt feels wired with pain meds    Family History  Problem Relation Age of Onset   Colon cancer Neg Hx    Prostate cancer Neg Hx     Social History   Socioeconomic History   Marital status: Single    Spouse name: Not on file   Number of children: 3   Years of education: 75   Highest education level: Not on file  Occupational History   Occupation: [employer]  Tobacco Use   Smoking status: Former    Current packs/day: 0.00    Types: Cigarettes    Quit date: 01/06/2017    Years since quitting: 6.7   Smokeless tobacco: Never  Vaping Use   Vaping status: Never Used  Substance and Sexual Activity   Alcohol use: Yes    Comment: occasional   Drug use: No   Sexual activity: Not on file  Other Topics Concern   Not on file  Social History Narrative   Lives alone   Caffeine use:  2 cups - coffee   Tea daily   Social Drivers of Health   Financial Resource Strain: Low Risk  (10/19/2022)   Overall Financial Resource Strain (CARDIA)    Difficulty of Paying Living  Expenses: Not hard at all  Food Insecurity: No Food Insecurity (10/19/2022)   Hunger Vital Sign    Worried About Running Out of Food in the Last Year: Never true    Ran Out of Food in the Last Year: Never true  Transportation Needs: No Transportation Needs (10/19/2022)   PRAPARE - Administrator, Civil Service (Medical): No    Lack of Transportation (Non-Medical): No  Physical Activity: Inactive (10/19/2022)   Exercise Vital Sign    Days of Exercise per Week: 0 days    Minutes of Exercise per Session: 0 min  Stress: No Stress Concern Present (10/19/2022)   [employer] of Occupational Health - Occupational Stress Questionnaire    Feeling of Stress : Not at all  Social Connections: Socially Isolated (10/19/2022)   Social Connection and Isolation Panel [NHANES]    Frequency of Communication with Friends and Family: More than three times a week    Frequency of Social Gatherings with Friends and Family: More than three times a week    Attends Religious Services: Never    Database Administrator or Organizations: No    Attends Banker Meetings: Never    Marital Status: Divorced  Catering Manager Violence: Not At Risk (10/19/2022)   Humiliation, Afraid, Rape, and Kick questionnaire    Fear of Current or Ex-Partner: No    Emotionally Abused: No    Physically Abused: No    Sexually Abused: No     Constitutional: Patient reports fatigue.  Denies fever, malaise, headache or abrupt weight changes.  HEENT: Patient reports runny nose and sneezing.  Denies eye pain, eye redness, ear pain, ringing in the ears, wax buildup, nasal congestion, bloody nose, or sore throat. Respiratory: Denies difficulty breathing, shortness of breath, cough or sputum production.   Cardiovascular: Denies chest pain, chest tightness, palpitations or swelling in the hands or feet.  Gastrointestinal: Denies abdominal pain, bloating, constipation, diarrhea or blood in the stool.  GU: Patient reports  erectile dysfunction.  Denies urgency, frequency, pain with urination, burning sensation, blood in urine, odor or discharge. Musculoskeletal: Denies decrease in range of motion, difficulty with gait, muscle pain or joint pain and swelling.  Skin:  Denies redness, lesions or ulcercations.  Neurological: Patient reports insomnia, dizziness.  Denies difficulty with memory, difficulty with speech or problems with balance and coordination.  Psych: Patient has a history of anxiety.  Denies depression, SI/HI.  No other specific complaints in a complete review of systems (except as listed in HPI above).     Objective:   Physical Exam  BP 138/84 (BP Location: Left Arm, Patient Position: Sitting, Cuff Size: Large)   Ht  5' 10 (1.778 m)   Wt 212 lb 9.6 oz (96.4 kg)   BMI 30.50 kg/m    Wt Readings from Last 3 Encounters:  08/08/23 223 lb (101.2 kg)  05/08/23 212 lb (96.2 kg)  10/19/22 214 lb (97.1 kg)    General: Appears his stated age, obese in NAD. Skin: Warm, dry and intact.  HEENT: Head: normal shape and size, no sinus tenderness noted; Eyes: sclera white, no icterus, conjunctiva pink, PERRLA and EOMs intact; Nose: Mucosa pink and moist, septum midline; Throat: Mucosa pink and moist, + PND, no exudate or ulcerations noted. Neck: No adenopathy noted. Cardiovascular: Normal rate and rhythm. S1,S2 noted.  No murmur, rubs or gallops noted. No JVD or BLE edema. No carotid bruits noted. Pulmonary/Chest: Normal effort and positive vesicular breath sounds. No respiratory distress. No wheezes, rales or ronchi noted.  Abdomen: Normal bowel sounds. Musculoskeletal:  No difficulty with gait.  Neurological: Alert and oriented.  Coordination normal.  Psychiatric: Mood and affect normal. Behavior is normal. Judgment and thought content normal.     BMET    Component Value Date/Time   NA 138 05/08/2023 1424   K 4.0 05/08/2023 1424   CL 102 05/08/2023 1424   CO2 26 05/08/2023 1424   GLUCOSE 87  05/08/2023 1424   BUN 15 05/08/2023 1424   CREATININE 1.32 05/08/2023 1424   CALCIUM  9.8 05/08/2023 1424   GFRNONAA 59 (L) 09/12/2017 0355   GFRAA >60 09/12/2017 0355    Lipid Panel     Component Value Date/Time   CHOL 178 05/08/2023 1424   TRIG 139 05/08/2023 1424   HDL 53 05/08/2023 1424   CHOLHDL 3.4 05/08/2023 1424   VLDL 19.4 12/17/2019 1124   LDLCALC 101 (H) 05/08/2023 1424    CBC    Component Value Date/Time   WBC 6.7 05/08/2023 1424   RBC 4.90 05/08/2023 1424   HGB 15.4 05/08/2023 1424   HCT 46.1 05/08/2023 1424   PLT 260 05/08/2023 1424   MCV 94.1 05/08/2023 1424   MCH 31.4 05/08/2023 1424   MCHC 33.4 05/08/2023 1424   RDW 14.5 05/08/2023 1424   LYMPHSABS 1.7 09/10/2017 0612   MONOABS 0.6 09/10/2017 0612   EOSABS 0.2 09/10/2017 0612   BASOSABS 0.1 09/10/2017 0612    Hgb A1C Lab Results  Component Value Date   HGBA1C 5.7 (H) 05/08/2023           Assessment & Plan:   Fatigue, Vit D deficiency, Hypotension, Dizziness:  Recent URI about 2 weeks, improved but still not fully resolved RX for Pred taper x 6 days No indications for abx at this time Will check CBC, CMET, Vit D and B12 today   RTC in 6 months for your annual exam Angeline Laura, NP

## 2023-10-21 NOTE — Assessment & Plan Note (Signed)
 A1C today Encouraged low carb diet and exercise for weight loss

## 2023-10-21 NOTE — Assessment & Plan Note (Signed)
Encourage regular stretching and core strengthening 

## 2023-10-21 NOTE — Assessment & Plan Note (Signed)
 Continue trazodone

## 2023-10-22 LAB — COMPLETE METABOLIC PANEL WITH GFR
AG Ratio: 1.3 (calc) (ref 1.0–2.5)
ALT: 38 U/L (ref 9–46)
AST: 29 U/L (ref 10–35)
Albumin: 4.4 g/dL (ref 3.6–5.1)
Alkaline phosphatase (APISO): 52 U/L (ref 35–144)
BUN: 22 mg/dL (ref 7–25)
CO2: 28 mmol/L (ref 20–32)
Calcium: 10 mg/dL (ref 8.6–10.3)
Chloride: 99 mmol/L (ref 98–110)
Creat: 1.29 mg/dL (ref 0.70–1.35)
Globulin: 3.3 g/dL (ref 1.9–3.7)
Glucose, Bld: 99 mg/dL (ref 65–99)
Potassium: 4.2 mmol/L (ref 3.5–5.3)
Sodium: 138 mmol/L (ref 135–146)
Total Bilirubin: 0.8 mg/dL (ref 0.2–1.2)
Total Protein: 7.7 g/dL (ref 6.1–8.1)
eGFR: 60 mL/min/{1.73_m2} (ref >=60)

## 2023-10-22 LAB — LIPID PANEL
Cholesterol: 158 mg/dL (ref <200)
HDL: 39 mg/dL — ABNORMAL LOW (ref >=40)
LDL Cholesterol (Calc): 95 mg/dL
Non-HDL Cholesterol (Calc): 119 mg/dL (ref <130)
Total CHOL/HDL Ratio: 4.1 (calc) (ref <5.0)
Triglycerides: 140 mg/dL (ref <150)

## 2023-10-22 LAB — CBC
HCT: 44.4 % (ref 38.5–50.0)
Hemoglobin: 14.9 g/dL (ref 13.2–17.1)
MCH: 31.8 pg (ref 27.0–33.0)
MCHC: 33.6 g/dL (ref 32.0–36.0)
MCV: 94.7 fL (ref 80.0–100.0)
MPV: 9.6 fL (ref 7.5–12.5)
Platelets: 236 10*3/uL (ref 140–400)
RBC: 4.69 10*6/uL (ref 4.20–5.80)
RDW: 13.7 % (ref 11.0–15.0)
WBC: 5.4 10*3/uL (ref 3.8–10.8)

## 2023-10-22 LAB — HEMOGLOBIN A1C
Hgb A1c MFr Bld: 5.6 %{Hb} (ref <5.7)
Mean Plasma Glucose: 114 mg/dL
eAG (mmol/L): 6.3 mmol/L

## 2023-10-22 LAB — TSH: TSH: 1.93 m[IU]/L (ref 0.40–4.50)

## 2023-10-22 LAB — VITAMIN D 25 HYDROXY (VIT D DEFICIENCY, FRACTURES): Vit D, 25-Hydroxy: 35 ng/mL (ref 30–100)

## 2023-10-25 ENCOUNTER — Ambulatory Visit (INDEPENDENT_AMBULATORY_CARE_PROVIDER_SITE_OTHER): Payer: Medicare HMO

## 2023-10-25 DIAGNOSIS — Z0000 Encounter for general adult medical examination without abnormal findings: Secondary | ICD-10-CM | POA: Diagnosis not present

## 2023-10-25 DIAGNOSIS — Z1211 Encounter for screening for malignant neoplasm of colon: Secondary | ICD-10-CM

## 2023-10-25 NOTE — Patient Instructions (Addendum)
 Mr. Peter Rose , Thank you for taking time to come for your Medicare Wellness Visit. I appreciate your ongoing commitment to your health goals. Please review the following plan we discussed and let me know if I can assist you in the future.   Referrals/Orders/Follow-Ups/Clinician Recommendations: COLOGUARD ORDER SENT IN  This is a list of the screening recommended for you and due dates:  Health Maintenance  Topic Date Due   Cologuard (Stool DNA test)  Never done   Zoster (Shingles) Vaccine (1 of 2) Never done   DTaP/Tdap/Td vaccine (2 - Td or Tdap) 05/28/2022   Pneumonia Vaccine (1 of 1 - PCV) 05/07/2024*   Flu Shot  05/15/2024*   Medicare Annual Wellness Visit  10/24/2024   Hepatitis C Screening  Completed   HPV Vaccine  Aged Out   COVID-19 Vaccine  Discontinued  *Topic was postponed. The date shown is not the original due date.    Advanced directives: (ACP Link)Information on Advanced Care Planning can be found at [place]  Secretary of [facility] Advance Health Care Directives Advance Health Care Directives ([email or web address]   Next Medicare Annual Wellness Visit scheduled for next year: Yes   10/30/24 @ 9:30 AM BY VIDEO

## 2023-10-25 NOTE — Progress Notes (Signed)
 Subjective:   Peter Rose is a 68 y.o. male who presents for Medicare Annual/Subsequent preventive examination.  Visit Complete: Virtual I connected with  Alm KANDICE Borer on 10/25/23 by a video and audio enabled telemedicine application and verified that I am speaking with the correct person using two identifiers.  Patient Location: Home  Provider Location: Office/Clinic  I discussed the limitations of evaluation and management by telemedicine. The patient expressed understanding and agreed to proceed.  Vital Signs: Because this visit was a virtual/telehealth visit, some criteria may be missing or patient reported. Any vitals not documented were not able to be obtained and vitals that have been documented are patient reported.  Cardiac Risk Factors include: advanced age (>69men, >72 women);hypertension;male gender;sedentary lifestyle;obesity (BMI >30kg/m2)     Objective:    There were no vitals filed for this visit. There is no height or weight on file to calculate BMI.     10/25/2023   10:40 AM 10/19/2022    2:00 PM 09/29/2020   10:30 AM 10/17/2019   10:11 AM 09/10/2017    6:04 AM 09/04/2017   11:00 AM 09/04/2017   10:54 AM  Advanced Directives  Does Patient Have a Medical Advance Directive? No No No No No No No  Would patient like information on creating a medical advance directive? No - Patient declined No - Patient declined No - Patient declined  No - Patient declined  No - Patient declined    Current Medications (verified) Outpatient Encounter Medications as of 10/25/2023  Medication Sig   aspirin  EC 81 MG tablet Take 1 tablet (81 mg total) by mouth daily. Swallow whole.   ibuprofen  (ADVIL ) 200 MG tablet Take 600-800 mg by mouth every 6 (six) hours as needed for moderate pain.   levocetirizine (XYZAL ) 5 MG tablet TAKE 1 TABLET BY MOUTH AT BEDTIME AS NEEDED FOR ALLERGIES   lisinopril -hydrochlorothiazide  (ZESTORETIC ) 10-12.5 MG tablet Take 1 tablet by mouth daily.    methotrexate  (RHEUMATREX) 2.5 MG tablet TAKE 10 TABLETS BY MOUTH ONCE A WEEK WHEN NEEDED FOR PSORIASIS FLARE.   sildenafil  (VIAGRA ) 100 MG tablet TAKE 1 TABLET BY MOUTH AS NEEDED FOR ERECTILE DYSFUNCTION   traZODone  (DESYREL ) 100 MG tablet Take 1 tablet (100 mg total) by mouth at bedtime. TAKE 2 TABLETS(200 MG) BY MOUTH AT BEDTIME   predniSONE  (DELTASONE ) 10 MG tablet Take 6 tabs on day 1, 5 tabs on day 2, 4 tabs on day 3, 3 tabs on day 4, 2 tabs on day 5, 1 tab on day 6 (Patient not taking: Reported on 10/25/2023)   No facility-administered encounter medications on file as of 10/25/2023.    Allergies (verified) Codeine, Hycodan [hydrocodone  bit-homatrop mbr], and Vicodin [hydrocodone -acetaminophen ]   History: Past Medical History:  Diagnosis Date   Complication of anesthesia    abdominal distention (brief ileus ves opioid induced constipation) following 09/10/17 knee arthroscopy   COVID    GERD (gastroesophageal reflux disease)    occasional   Medical history non-contributory    Psoriasis    Past Surgical History:  Procedure Laterality Date   ELBOW SURGERY Right    ligament repar 11/2017   KNEE ARTHROSCOPY WITH ANTERIOR CRUCIATE LIGAMENT (ACL) REPAIR WITH HAMSTRING GRAFT Right 09/10/2017   Procedure: KNEE ARTHROSCOPY WITH ANTERIOR CRUCIATE LIGAMENT (ACL) REPAIR WITH HAMSTRING GRAFT AND  ALLOGRAFT ,& MEDIAL MENISCECTOMY;  Surgeon: Marchia Drivers, MD;  Location: [facility] ORS;  Service: Orthopedics;  Laterality: Right;   LUMBAR LAMINECTOMY/DECOMPRESSION MICRODISCECTOMY Right 09/30/2020   Procedure: Right  Lumbar four-five Laminectomy for facet/synovial cyst;  Surgeon: Colon Shove, MD;  Location: [facility];  Service: Neurosurgery;  Laterality: Right;   MELANOMA EXCISION Left 11/23/2021   Left Leg   MOHS SURGERY  11/14/2021   Family History  Problem Relation Age of Onset   Colon cancer Neg Hx    Prostate cancer Neg Hx    Social History   Socioeconomic History   Marital status: Single     Spouse name: Not on file   Number of children: 3   Years of education: 12   Highest education level: Not on file  Occupational History   Occupation: [employer]  Tobacco Use   Smoking status: Former    Current packs/day: 0.00    Types: Cigarettes    Quit date: 01/06/2017    Years since quitting: 6.8   Smokeless tobacco: Never  Vaping Use   Vaping status: Never Used  Substance and Sexual Activity   Alcohol use: Yes    Comment: occasional   Drug use: No   Sexual activity: Not on file  Other Topics Concern   Not on file  Social History Narrative   Lives alone   Caffeine use:  2 cups - coffee   Tea daily   Social Drivers of Corporate Investment Banker Strain: Low Risk  (10/25/2023)   Overall Financial Resource Strain (CARDIA)    Difficulty of Paying Living Expenses: Not hard at all  Food Insecurity: No Food Insecurity (10/25/2023)   Hunger Vital Sign    Worried About Running Out of Food in the Last Year: Never true    Ran Out of Food in the Last Year: Never true  Transportation Needs: No Transportation Needs (10/25/2023)   PRAPARE - Administrator, Civil Service (Medical): No    Lack of Transportation (Non-Medical): No  Physical Activity: Insufficiently Active (10/25/2023)   Exercise Vital Sign    Days of Exercise per Week: 2 days    Minutes of Exercise per Session: 20 min  Stress: No Stress Concern Present (10/25/2023)   [employer] of Occupational Health - Occupational Stress Questionnaire    Feeling of Stress : Not at all  Social Connections: Moderately Integrated (10/25/2023)   Social Connection and Isolation Panel [NHANES]    Frequency of Communication with Friends and Family: More than three times a week    Frequency of Social Gatherings with Friends and Family: Twice a week    Attends Religious Services: Never    Database Administrator or Organizations: Yes    Attends Engineer, Structural: More than 4 times per year    Marital Status: Married     Tobacco Counseling Counseling given: Not Answered   Clinical Intake:  Pre-visit preparation completed: Yes  Pain : No/denies pain     BMI - recorded: 30.4 Nutritional Status: BMI > 30  Obese Nutritional Risks: None Diabetes: No  How often do you need to have someone help you when you read instructions, pamphlets, or other written materials from your doctor or pharmacy?: 1 - Never  Interpreter Needed?: No  Information entered by :: JHONNIE DAS, LPN   Activities of Daily Living    10/25/2023   10:41 AM 10/24/2023   12:57 PM  In your present state of health, do you have any difficulty performing the following activities:  Hearing? 0 0  Vision? 0 0  Difficulty concentrating or making decisions? 0 0  Walking or climbing stairs? 0 0  Dressing  or bathing? 0 0  Doing errands, shopping? 0 0  Preparing Food and eating ? N N  Using the Toilet? N N  In the past six months, have you accidently leaked urine? N N  Do you have problems with loss of bowel control? N N  Managing your Medications? N N  Managing your Finances? N N  Housekeeping or managing your Housekeeping? N N    Patient Care Team: Antonette Angeline ORN, NP as PCP - General (Internal Medicine) [facility], [facility] (Optometry)  Indicate any recent Medical Services you may have received from other than [facility] providers in the past year (date may be approximate).     Assessment:   This is a routine wellness examination for Itay.  Hearing/Vision screen Hearing Screening - Comments:: NO AIDS Vision Screening - Comments:: WEARS CONTACT IN [facility]- [facility]   Goals Addressed             This Visit's Progress    DIET - INCREASE WATER INTAKE         Depression Screen    10/25/2023   10:38 AM 10/21/2023    8:54 AM 05/08/2023    2:11 PM 10/19/2022    1:59 PM 09/21/2022   10:47 AM 12/14/2021    8:10 AM 04/21/2021    1:29 PM  PHQ 2/9 Scores  PHQ - 2 Score 0 4 0 0 0 0 0  PHQ- 9 Score 0 9  0  0 0    Fall  Risk    10/25/2023   10:41 AM 10/24/2023   12:57 PM 10/21/2023    8:54 AM 05/08/2023    2:11 PM 10/19/2022    2:00 PM  Fall Risk   Falls in the past year? 0 1 0 0 0  Number falls in past yr: 0  1  0  Injury with Fall? 0 1 0  0  Risk for fall due to : No Fall Risks    No Fall Risks  Follow up Falls prevention discussed;Falls evaluation completed    Falls prevention discussed;Falls evaluation completed    MEDICARE RISK AT HOME: Medicare Risk at Home Any stairs in or around the home?: (Patient-Rptd) Yes If so, are there any without handrails?: (Patient-Rptd) Yes Home free of loose throw rugs in walkways, pet beds, electrical cords, etc?: (Patient-Rptd) Yes Adequate lighting in your home to reduce risk of falls?: (Patient-Rptd) No Life alert?: (Patient-Rptd) No Use of a cane, walker or w/c?: (Patient-Rptd) No Grab bars in the bathroom?: (Patient-Rptd) No Shower chair or bench in shower?: (Patient-Rptd) No Elevated toilet seat or a handicapped toilet?: (Patient-Rptd) No  TIMED UP AND GO:  Was the test performed?  No    Cognitive Function:        10/19/2022    2:01 PM  6CIT Screen  What Year? 0 points  What month? 0 points  What time? 0 points  Count back from 20 0 points  Months in reverse 0 points  Repeat phrase 0 points  Total Score 0 points    Immunizations Immunization History  Administered Date(s) Administered   Tdap 05/28/2012    TDAP status: Due, Education has been provided regarding the importance of this vaccine. Advised may receive this vaccine at local pharmacy or Health Dept. Aware to provide a copy of the vaccination record if obtained from local pharmacy or Health Dept. Verbalized acceptance and understanding.  Flu Vaccine status: Declined, Education has been provided regarding the importance of this vaccine  but patient still declined. Advised may receive this vaccine at local pharmacy or Health Dept. Aware to provide a copy of the vaccination record if obtained  from local pharmacy or Health Dept. Verbalized acceptance and understanding.  Pneumococcal vaccine status: Declined,  Education has been provided regarding the importance of this vaccine but patient still declined. Advised may receive this vaccine at local pharmacy or Health Dept. Aware to provide a copy of the vaccination record if obtained from local pharmacy or Health Dept. Verbalized acceptance and understanding.   Covid-19 vaccine status: Declined, Education has been provided regarding the importance of this vaccine but patient still declined. Advised may receive this vaccine at local pharmacy or Health Dept.or vaccine clinic. Aware to provide a copy of the vaccination record if obtained from local pharmacy or Health Dept. Verbalized acceptance and understanding.  Qualifies for Shingles Vaccine? Yes   Zostavax completed No   Shingrix Completed?: No.    Education has been provided regarding the importance of this vaccine. Patient has been advised to call insurance company to determine out of pocket expense if they have not yet received this vaccine. Advised may also receive vaccine at local pharmacy or Health Dept. Verbalized acceptance and understanding.  Screening Tests Health Maintenance  Topic Date Due   Fecal DNA (Cologuard)  Never done   Zoster Vaccines- Shingrix (1 of 2) Never done   DTaP/Tdap/Td (2 - Td or Tdap) 05/28/2022   Pneumonia Vaccine 57+ Years old (1 of 1 - PCV) 05/07/2024 (Originally 09/14/2020)   INFLUENZA VACCINE  05/15/2024 (Originally 05/16/2023)   Medicare Annual Wellness (AWV)  10/24/2024   Hepatitis C Screening  Completed   HPV VACCINES  Aged Out   COVID-19 Vaccine  Discontinued    Health Maintenance  Health Maintenance Due  Topic Date Due   Fecal DNA (Cologuard)  Never done   Zoster Vaccines- Shingrix (1 of 2) Never done   DTaP/Tdap/Td (2 - Td or Tdap) 05/28/2022    Colorectal cancer screening: Referral to GI placed 10/25/23. Pt aware the office will call  re: appt.- COLOGUARD ORDERED  Lung Cancer Screening: (Low Dose CT Chest recommended if Age 86-80 years, 20 pack-year currently smoking OR have quit w/in 15years.) does not qualify.    Additional Screening:  Hepatitis C Screening: does qualify; Completed 12/14/21  Vision Screening: Recommended annual ophthalmology exams for early detection of glaucoma and other disorders of the eye. Is the patient up to date with their annual eye exam?  Yes  Who is the provider or what is the name of the office in which the patient attends annual eye exams? [facility] If pt is not established with a provider, would they like to be referred to a provider to establish care? No .   Dental Screening: Recommended annual dental exams for proper oral hygiene   Community Resource Referral / Chronic Care Management: CRR required this visit?  No   CCM required this visit?  No     Plan:     I have personally reviewed and noted the following in the patient's chart:   Medical and social history Use of alcohol, tobacco or illicit drugs  Current medications and supplements including opioid prescriptions. Patient is not currently taking opioid prescriptions. Functional ability and status Nutritional status Physical activity Advanced directives List of other physicians Hospitalizations, surgeries, and ER visits in previous 12 months Vitals Screenings to include cognitive, depression, and falls Referrals and appointments  In addition, I have reviewed and discussed with patient  certain preventive protocols, quality metrics, and best practice recommendations. A written personalized care plan for preventive services as well as general preventive health recommendations were provided to patient.     Jhonnie GORMAN Das, LPN   [id number]/[id number]   After Visit Summary: (MyChart) Due to this being a telephonic visit, the after visit summary with patients personalized plan was offered to patient via MyChart   Nurse Notes:  NONE

## 2023-11-01 ENCOUNTER — Other Ambulatory Visit: Payer: Self-pay | Admitting: Internal Medicine

## 2023-11-01 DIAGNOSIS — F5101 Primary insomnia: Secondary | ICD-10-CM

## 2023-11-01 NOTE — Telephone Encounter (Signed)
Requested Prescriptions  Pending Prescriptions Disp Refills   traZODone (DESYREL) 100 MG tablet [Pharmacy Med Name: TRAZODONE 100MG  TABLETS] 180 tablet 0    Sig: TAKE 2 TABLETS BY MOUTH EVERY NIGHT AT BEDTIME     Psychiatry: Antidepressants - Serotonin Modulator Passed - 11/01/2023 11:29 AM      Passed - Valid encounter within last 6 months    Recent Outpatient Visits           1 week ago Prediabetes   [place] [facility] [place], Salvadore Oxford, NP   2 months ago Neck pain on right side   [facility] [place], Salvadore Oxford, NP   5 months ago Encounter for general adult medical examination with abnormal findings   [place] [facility] [place], Salvadore Oxford, NP   1 year ago Atherosclerosis of both carotid arteries   [place] [facility] [place], Salvadore Oxford, NP   1 year ago Encounter for general adult medical examination with abnormal findings   [place] [facility] [place], Salvadore Oxford, NP       Future Appointments             In 5 months Baity, Salvadore Oxford, NP [place] [facility], [facility]

## 2023-11-08 ENCOUNTER — Ambulatory Visit: Payer: Medicare HMO | Admitting: Internal Medicine

## 2023-11-08 DIAGNOSIS — L400 Psoriasis vulgaris: Secondary | ICD-10-CM | POA: Diagnosis not present

## 2023-11-08 DIAGNOSIS — D2271 Melanocytic nevi of right lower limb, including hip: Secondary | ICD-10-CM | POA: Diagnosis not present

## 2023-11-08 DIAGNOSIS — D2272 Melanocytic nevi of left lower limb, including hip: Secondary | ICD-10-CM | POA: Diagnosis not present

## 2023-11-08 DIAGNOSIS — Z08 Encounter for follow-up examination after completed treatment for malignant neoplasm: Secondary | ICD-10-CM | POA: Diagnosis not present

## 2023-11-08 DIAGNOSIS — D225 Melanocytic nevi of trunk: Secondary | ICD-10-CM | POA: Diagnosis not present

## 2023-11-08 DIAGNOSIS — D2261 Melanocytic nevi of right upper limb, including shoulder: Secondary | ICD-10-CM | POA: Diagnosis not present

## 2023-11-08 DIAGNOSIS — D2262 Melanocytic nevi of left upper limb, including shoulder: Secondary | ICD-10-CM | POA: Diagnosis not present

## 2023-11-08 DIAGNOSIS — Z85828 Personal history of other malignant neoplasm of skin: Secondary | ICD-10-CM | POA: Diagnosis not present

## 2023-11-08 DIAGNOSIS — Z85820 Personal history of malignant melanoma of skin: Secondary | ICD-10-CM | POA: Diagnosis not present

## 2023-12-03 ENCOUNTER — Other Ambulatory Visit: Payer: Self-pay | Admitting: Internal Medicine

## 2023-12-03 NOTE — Telephone Encounter (Signed)
Requested medications are due for refill today.  yes  Requested medications are on the active medications list.  yes  Last refill. 07/17/2023 #128 0 rf  Future visit scheduled.   yes  Notes to clinic.  Labs are expired. Refill not delegated.    Requested Prescriptions  Pending Prescriptions Disp Refills   methotrexate (RHEUMATREX) 2.5 MG tablet [Pharmacy Med Name: METHOTREXATE 2.5MG  TABLETS - YELLOW] 128 tablet 0    Sig: TAKE 10 TABLETS BY MOUTH ONCE A WEEK WHEN NEEDED FOR PSORIASIS FLARE.     Not Delegated - Immunology: Immunosuppressive Agents - methotrexate Failed - 12/03/2023  5:49 PM      Failed - This refill cannot be delegated      Failed - Albumin in normal range and within 360 days    Albumin  Date Value Ref Range Status  12/17/2019 4.4 3.5 - 5.2 g/dL Final         Passed - ALT in normal range and within 90 days    ALT  Date Value Ref Range Status  10/21/2023 38 9 - 46 U/L Final         Passed - AST in normal range and within 90 days    AST  Date Value Ref Range Status  10/21/2023 29 10 - 35 U/L Final         Passed - Cr in normal range and within 90 days    Creat  Date Value Ref Range Status  10/21/2023 1.29 0.70 - 1.35 mg/dL Final         Passed - HCT in normal range and within 90 days    HCT  Date Value Ref Range Status  10/21/2023 44.4 38.5 - 50.0 % Final         Passed - HGB in normal range and within 90 days    Hemoglobin  Date Value Ref Range Status  10/21/2023 14.9 13.2 - 17.1 g/dL Final         Passed - PLT in normal range and within 90 days    Platelets  Date Value Ref Range Status  10/21/2023 236 140 - 400 Thousand/uL Final         Passed - WBC in normal range and within 90 days    WBC  Date Value Ref Range Status  10/21/2023 5.4 3.8 - 10.8 Thousand/uL Final         Passed - eGFR is 10 or above and within 90 days    GFR calc Af Amer  Date Value Ref Range Status  09/12/2017 >60 >60 mL/min Final    Comment:    (NOTE) The eGFR  has been calculated using the CKD EPI equation. This calculation has not been validated in all clinical situations. eGFR's persistently <60 mL/min signify possible Chronic Kidney Disease.    GFR calc non Af Amer  Date Value Ref Range Status  09/12/2017 59 (L) >60 mL/min Final   GFR  Date Value Ref Range Status  10/28/2020 72.21 >60.00 mL/min Final    Comment:    Calculated using the CKD-EPI Creatinine Equation (2021)   eGFR  Date Value Ref Range Status  10/21/2023 60 > OR = 60 mL/min/1.52m2 Final         Passed - Patient is not pregnant      Passed - Valid encounter within last 3 months    Recent Outpatient Visits           1 month ago Prediabetes   [place]  [facility] [place], Salvadore Oxford, NP   3 months ago Neck pain on right side   [place] [facility] [place], Salvadore Oxford, NP   6 months ago Encounter for general adult medical examination with abnormal findings   [place] [facility] [place], Salvadore Oxford, NP   1 year ago Atherosclerosis of both carotid arteries   [place] [facility] [place], Salvadore Oxford, NP   1 year ago Encounter for general adult medical examination with abnormal findings   [place] [facility] [place], Salvadore Oxford, NP       Future Appointments             In 4 months Baity, Salvadore Oxford, NP [facility], [facility]

## 2024-01-28 ENCOUNTER — Other Ambulatory Visit: Payer: Self-pay | Admitting: Internal Medicine

## 2024-01-28 DIAGNOSIS — F5101 Primary insomnia: Secondary | ICD-10-CM

## 2024-01-28 NOTE — Telephone Encounter (Signed)
 Requested Prescriptions  Pending Prescriptions Disp Refills   traZODone (DESYREL) 100 MG tablet [Pharmacy Med Name: TRAZODONE 100MG  TABLETS] 180 tablet 0    Sig: TAKE 2 TABLETS BY MOUTH EVERY NIGHT AT BEDTIME     Psychiatry: Antidepressants - Serotonin Modulator Failed - 01/28/2024  5:54 PM      Failed - Valid encounter within last 6 months    Recent Outpatient Visits   None     Future Appointments             In 2 months Baity, Rankin Buzzard, NP [place] [facility], [facility]

## 2024-02-18 ENCOUNTER — Ambulatory Visit
Admission: RE | Admit: 2024-02-18 | Discharge: 2024-02-18 | Disposition: A | Discharge status: Home / Self Care | Payer: Self-pay | Source: Ambulatory Visit | Attending: Nurse Practitioner

## 2024-02-18 ENCOUNTER — Other Ambulatory Visit: Payer: Self-pay | Admitting: Nurse Practitioner

## 2024-02-18 DIAGNOSIS — M25511 Pain in right shoulder: Secondary | ICD-10-CM

## 2024-03-29 ENCOUNTER — Other Ambulatory Visit: Payer: Self-pay | Admitting: Internal Medicine

## 2024-03-31 NOTE — Telephone Encounter (Signed)
 Requested Prescriptions  Pending Prescriptions Disp Refills   lisinopril -hydrochlorothiazide  (ZESTORETIC ) 10-12.5 MG tablet [Pharmacy Med Name: LISINOPRIL -HCTZ 10/12.5MG  TABLETS] 45 tablet 0    Sig: TAKE 1/2 TABLET BY MOUTH DAILY     Cardiovascular:  ACEI + Diuretic Combos Failed - 03/31/2024  1:47 PM      Failed - Valid encounter within last 6 months    Recent Outpatient Visits   None     Future Appointments             In 3 weeks Baity, Rankin Buzzard, NP [facility], [facility]            Passed - Na in normal range and within 180 days    Sodium  Date Value Ref Range Status  10/21/2023 138 135 - 146 mmol/L Final         Passed - K in normal range and within 180 days    Potassium  Date Value Ref Range Status  10/21/2023 4.2 3.5 - 5.3 mmol/L Final         Passed - Cr in normal range and within 180 days    Creat  Date Value Ref Range Status  10/21/2023 1.29 0.70 - 1.35 mg/dL Final         Passed - eGFR is 30 or above and within 180 days    GFR calc Af Amer  Date Value Ref Range Status  09/12/2017 >60 >60 mL/min Final    Comment:    (NOTE) The eGFR has been calculated using the CKD EPI equation. This calculation has not been validated in all clinical situations. eGFR's persistently <60 mL/min signify possible Chronic Kidney Disease.    GFR calc non Af Amer  Date Value Ref Range Status  09/12/2017 59 (L) >60 mL/min Final   GFR  Date Value Ref Range Status  10/28/2020 72.21 >60.00 mL/min Final    Comment:    Calculated using the CKD-EPI Creatinine Equation (2021)   eGFR  Date Value Ref Range Status  10/21/2023 60 > OR = 60 mL/min/1.25m2 Final         Passed - Patient is not pregnant      Passed - Last BP in normal range    BP Readings from Last 1 Encounters:  10/21/23 138/84

## 2024-04-01 ENCOUNTER — Other Ambulatory Visit: Payer: Self-pay | Admitting: Internal Medicine

## 2024-04-02 ENCOUNTER — Other Ambulatory Visit: Payer: Self-pay | Admitting: Internal Medicine

## 2024-04-03 NOTE — Telephone Encounter (Signed)
 Requested Prescriptions  Pending Prescriptions Disp Refills   sildenafil  (VIAGRA ) 100 MG tablet [Pharmacy Med Name: SILDENAFIL  100MG  TABLETS] 10 tablet 3    Sig: TAKE 1 TABLET BY MOUTH AS NEEDED FOR ERECTILE DYSFUNCTION     Urology: Erectile Dysfunction Agents Failed - 04/03/2024  1:17 PM      Failed - Valid encounter within last 12 months    Recent Outpatient Visits   None     Future Appointments             In 3 weeks Baity, Rankin Buzzard, NP [facility], [facility]            Passed - AST in normal range and within 360 days    AST  Date Value Ref Range Status  10/21/2023 29 10 - 35 U/L Final         Passed - ALT in normal range and within 360 days    ALT  Date Value Ref Range Status  10/21/2023 38 9 - 46 U/L Final         Passed - Last BP in normal range    BP Readings from Last 1 Encounters:  10/21/23 138/84

## 2024-04-06 NOTE — Telephone Encounter (Signed)
 Requested Prescriptions  Pending Prescriptions Disp Refills   levocetirizine (XYZAL ) 5 MG tablet [Pharmacy Med Name: LEVOCETIRIZINE 5MG  TABLETS] 90 tablet 0    Sig: TAKE 1 TABLET BY MOUTH AT BEDTIME AS NEEDED FOR ALLERGIES     Ear, Nose, and Throat:  Antihistamines - levocetirizine dihydrochloride  Failed - 04/06/2024  7:50 AM      Failed - Valid encounter within last 12 months    Recent Outpatient Visits   None     Future Appointments             In 2 weeks Antonette Angeline ORN, NP [facility], [facility]            Passed - Cr in normal range and within 360 days    Creat  Date Value Ref Range Status  10/21/2023 1.29 0.70 - 1.35 mg/dL Final         Passed - eGFR is 10 or above and within 360 days    GFR calc Af Amer  Date Value Ref Range Status  09/12/2017 >60 >60 mL/min Final    Comment:    (NOTE) The eGFR has been calculated using the CKD EPI equation. This calculation has not been validated in all clinical situations. eGFR's persistently <60 mL/min signify possible Chronic Kidney Disease.    GFR calc non Af Amer  Date Value Ref Range Status  09/12/2017 59 (L) >60 mL/min Final   GFR  Date Value Ref Range Status  10/28/2020 72.21 >60.00 mL/min Final    Comment:    Calculated using the CKD-EPI Creatinine Equation (2021)   eGFR  Date Value Ref Range Status  10/21/2023 60 > OR = 60 mL/min/1.67m2 Final

## 2024-04-08 ENCOUNTER — Telehealth: Payer: Self-pay

## 2024-04-08 NOTE — Telephone Encounter (Signed)
 Copied from CRM [id number]. Topic: General - Other >> Apr 08, 2024  2:53 PM Essie A wrote: Reason for CRM: Patient canceled his physical appt.  He is having right shoulder surgery on 04/23/24 and will be out for at least 4-6 weeks or more.  Please have someone call him back sometime in September to reschedule his physical.

## 2024-04-08 NOTE — Telephone Encounter (Signed)
 Called and spoke with patient and advised him to give us  a call some time in September when he'd like to get his appt rescheduled.

## 2024-04-24 ENCOUNTER — Ambulatory Visit: Payer: Self-pay | Admitting: Internal Medicine

## 2024-04-28 ENCOUNTER — Telehealth: Payer: Self-pay

## 2024-04-28 MED ORDER — ATORVASTATIN CALCIUM 10 MG PO TABS: 10.0000 mg | ORAL_TABLET | Freq: Every day | ORAL | 1 refills | Status: DC

## 2024-04-28 NOTE — Addendum Note (Signed)
 Addended by: ANTONETTE ANGELINE ORN on: 04/28/2024 10:34 AM   Modules accepted: Orders

## 2024-04-28 NOTE — Telephone Encounter (Signed)
 Attempted to call and was on hold for some time. Requested a call back to the office. Okay to advise below if [facility] calls back.

## 2024-04-28 NOTE — Telephone Encounter (Signed)
 Patient aware of results and recommendations.  He is agreeable to start medication.

## 2024-04-28 NOTE — Telephone Encounter (Signed)
 Please call [employer] at [phone] and let them know patient is going to start cholesterol-lowering medication.  I do not recall seeing any fax on this patient.

## 2024-04-28 NOTE — Telephone Encounter (Signed)
 Please call patient and see if he would be willing to start statin therapy.  His LDL was 101 and we would like this to be <70 given his history of aortic atherosclerosis which would decrease his risk of heart attack and stroke.  I had mentioned this to him after his last appointment but he never responded to MyChart.

## 2024-04-28 NOTE — Telephone Encounter (Signed)
 Copied from CRM [id number]. Topic: General - Other >> Apr 27, 2024  3:17 PM DeAngela L wrote: Reason for CRM: [employer] has faxed a drug therapy form with suggestion to put the patient on a statin and they would like to ask if the office received the fax that has been sent multiple time and the last most recent fax was 04/24/24 [facility] call back number for any questions  [phone] return fax num [phone]

## 2024-05-06 ENCOUNTER — Other Ambulatory Visit: Payer: Self-pay | Admitting: Internal Medicine

## 2024-05-06 DIAGNOSIS — F5101 Primary insomnia: Secondary | ICD-10-CM

## 2024-05-07 NOTE — Telephone Encounter (Signed)
 Requested Prescriptions  Pending Prescriptions Disp Refills   traZODone  (DESYREL ) 100 MG tablet [Pharmacy Med Name: TRAZODONE  100MG  TABLETS] 180 tablet 0    Sig: TAKE 2 TABLETS BY MOUTH EVERY NIGHT AT BEDTIME     Psychiatry: Antidepressants - Serotonin Modulator Failed - 05/07/2024  4:22 PM      Failed - Valid encounter within last 6 months    Recent Outpatient Visits   None     Future Appointments             In 1 month Baity, Angeline ORN, NP [facility], [facility]

## 2024-05-29 ENCOUNTER — Other Ambulatory Visit: Payer: Self-pay | Admitting: Internal Medicine

## 2024-06-02 NOTE — Telephone Encounter (Signed)
 Requested medication (s) are due for refill today:   Provider to review  Requested medication (s) are on the active medication list:   Yes  Future visit scheduled:   Yes 8//26   Last ordered: 12/04/2023 #128, 0 refills  Non delegated refill    Requested Prescriptions  Pending Prescriptions Disp Refills   methotrexate  (RHEUMATREX) 2.5 MG tablet [Pharmacy Med Name: METHOTREXATE  2.5MG  TABLETS - YELLOW] 128 tablet 0    Sig: TAKE 10 TABLETS BY MOUTH ONCE A WEEK WHEN NEEDED FOR PSORIASIS FLARE.     Not Delegated - Immunology: Immunosuppressive Agents - methotrexate  Failed - 06/02/2024  7:51 AM      Failed - This refill cannot be delegated      Failed - ALT in normal range and within 90 days    ALT  Date Value Ref Range Status  10/21/2023 38 9 - 46 U/L Final         Failed - AST in normal range and within 90 days    AST  Date Value Ref Range Status  10/21/2023 29 10 - 35 U/L Final         Failed - Cr in normal range and within 90 days    Creat  Date Value Ref Range Status  10/21/2023 1.29 0.70 - 1.35 mg/dL Final         Failed - HCT in normal range and within 90 days    HCT  Date Value Ref Range Status  10/21/2023 44.4 38.5 - 50.0 % Final         Failed - HGB in normal range and within 90 days    Hemoglobin  Date Value Ref Range Status  10/21/2023 14.9 13.2 - 17.1 g/dL Final         Failed - PLT in normal range and within 90 days    Platelets  Date Value Ref Range Status  10/21/2023 236 140 - 400 Thousand/uL Final         Failed - WBC in normal range and within 90 days    WBC  Date Value Ref Range Status  10/21/2023 5.4 3.8 - 10.8 Thousand/uL Final         Failed - Albumin in normal range and within 360 days    Albumin  Date Value Ref Range Status  12/17/2019 4.4 3.5 - 5.2 g/dL Final         Failed - eGFR is 10 or above and within 90 days    GFR calc Af Amer  Date Value Ref Range Status  09/12/2017 >60 >60 mL/min Final    Comment:    (NOTE) The eGFR  has been calculated using the CKD EPI equation. This calculation has not been validated in all clinical situations. eGFR's persistently <60 mL/min signify possible Chronic Kidney Disease.    GFR calc non Af Amer  Date Value Ref Range Status  09/12/2017 59 (L) >60 mL/min Final   GFR  Date Value Ref Range Status  10/28/2020 72.21 >60.00 mL/min Final    Comment:    Calculated using the CKD-EPI Creatinine Equation (2021)   eGFR  Date Value Ref Range Status  10/21/2023 60 > OR = 60 mL/min/1.13m2 Final         Failed - Valid encounter within last 3 months    Recent Outpatient Visits   None     Future Appointments             In 1 week Antonette Angeline ORN, NP  [facility], [facility]            Passed - Patient is not pregnant

## 2024-06-09 ENCOUNTER — Encounter: Payer: Self-pay | Admitting: Internal Medicine

## 2024-06-09 ENCOUNTER — Ambulatory Visit (INDEPENDENT_AMBULATORY_CARE_PROVIDER_SITE_OTHER): Admitting: Internal Medicine

## 2024-06-09 VITALS — BP 136/80 | Ht 70.0 in | Wt 205.6 lb

## 2024-06-09 DIAGNOSIS — I10 Essential (primary) hypertension: Secondary | ICD-10-CM

## 2024-06-09 DIAGNOSIS — R7303 Prediabetes: Secondary | ICD-10-CM

## 2024-06-09 DIAGNOSIS — Z0001 Encounter for general adult medical examination with abnormal findings: Secondary | ICD-10-CM | POA: Diagnosis not present

## 2024-06-09 DIAGNOSIS — Z125 Encounter for screening for malignant neoplasm of prostate: Secondary | ICD-10-CM | POA: Diagnosis not present

## 2024-06-09 DIAGNOSIS — R6882 Decreased libido: Secondary | ICD-10-CM

## 2024-06-09 DIAGNOSIS — Z6829 Body mass index (BMI) 29.0-29.9, adult: Secondary | ICD-10-CM

## 2024-06-09 DIAGNOSIS — Z1211 Encounter for screening for malignant neoplasm of colon: Secondary | ICD-10-CM

## 2024-06-09 DIAGNOSIS — Z136 Encounter for screening for cardiovascular disorders: Secondary | ICD-10-CM

## 2024-06-09 DIAGNOSIS — E663 Overweight: Secondary | ICD-10-CM

## 2024-06-09 MED ORDER — METHOTREXATE SODIUM 2.5 MG PO TABS: ORAL_TABLET | ORAL | 1 refills | Status: AC

## 2024-06-09 NOTE — Patient Instructions (Signed)
 Health Maintenance, Male  Adopting a healthy lifestyle and getting preventive care are important in promoting health and wellness. Ask your health care provider about:  The right schedule for you to have regular tests and exams.  Things you can do on your own to prevent diseases and keep yourself healthy.  What should I know about diet, weight, and exercise?  Eat a healthy diet    Eat a diet that includes plenty of vegetables, fruits, low-fat dairy products, and lean protein.  Do not eat a lot of foods that are high in solid fats, added sugars, or sodium.  Maintain a healthy weight  Body mass index (BMI) is a measurement that can be used to identify possible weight problems. It estimates body fat based on height and weight. Your health care provider can help determine your BMI and help you achieve or maintain a healthy weight.  Get regular exercise  Get regular exercise. This is one of the most important things you can do for your health. Most adults should:  Exercise for at least 150 minutes each week. The exercise should increase your heart rate and make you sweat (moderate-intensity exercise).  Do strengthening exercises at least twice a week. This is in addition to the moderate-intensity exercise.  Spend less time sitting. Even light physical activity can be beneficial.  Watch cholesterol and blood lipids  Have your blood tested for lipids and cholesterol at 69 years of age, then have this test every 5 years.  You may need to have your cholesterol levels checked more often if:  Your lipid or cholesterol levels are high.  You are older than 69 years of age.  You are at high risk for heart disease.  What should I know about cancer screening?  Many types of cancers can be detected early and may often be prevented. Depending on your health history and family history, you may need to have cancer screening at various ages. This may include screening for:  Colorectal cancer.  Prostate cancer.  Skin cancer.  Lung  cancer.  What should I know about heart disease, diabetes, and high blood pressure?  Blood pressure and heart disease  High blood pressure causes heart disease and increases the risk of stroke. This is more likely to develop in people who have high blood pressure readings or are overweight.  Talk with your health care provider about your target blood pressure readings.  Have your blood pressure checked:  Every 3-5 years if you are 24-52 years of age.  Every year if you are 3 years old or older.  If you are between the ages of 60 and 72 and are a current or former smoker, ask your health care provider if you should have a one-time screening for abdominal aortic aneurysm (AAA).  Diabetes  Have regular diabetes screenings. This checks your fasting blood sugar level. Have the screening done:  Once every three years after age 66 if you are at a normal weight and have a low risk for diabetes.  More often and at a younger age if you are overweight or have a high risk for diabetes.  What should I know about preventing infection?  Hepatitis B  If you have a higher risk for hepatitis B, you should be screened for this virus. Talk with your health care provider to find out if you are at risk for hepatitis B infection.  Hepatitis C  Blood testing is recommended for:  Everyone born from [birth year] through 1966.  Anyone  with known risk factors for hepatitis C.  Sexually transmitted infections (STIs)  You should be screened each year for STIs, including gonorrhea and chlamydia, if:  You are sexually active and are younger than 69 years of age.  You are older than 69 years of age and your health care provider tells you that you are at risk for this type of infection.  Your sexual activity has changed since you were last screened, and you are at increased risk for chlamydia or gonorrhea. Ask your health care provider if you are at risk.  Ask your health care provider about whether you are at high risk for HIV. Your health care provider  may recommend a prescription medicine to help prevent HIV infection. If you choose to take medicine to prevent HIV, you should first get tested for HIV. You should then be tested every 3 months for as long as you are taking the medicine.  Follow these instructions at home:  Alcohol use  Do not drink alcohol if your health care provider tells you not to drink.  If you drink alcohol:  Limit how much you have to 0-2 drinks a day.  Know how much alcohol is in your drink. In the [place] one drink equals one 12 oz bottle of beer (355 mL), one 5 oz glass of wine (148 mL), or one 1 oz glass of hard liquor (44 mL).  Lifestyle  Do not use any products that contain nicotine or tobacco. These products include cigarettes, chewing tobacco, and vaping devices, such as e-cigarettes. If you need help quitting, ask your health care provider.  Do not use street drugs.  Do not share needles.  Ask your health care provider for help if you need support or information about quitting drugs.  General instructions  Schedule regular health, dental, and eye exams.  Stay current with your vaccines.  Tell your health care provider if:  You often feel depressed.  You have ever been abused or do not feel safe at home.  Summary  Adopting a healthy lifestyle and getting preventive care are important in promoting health and wellness.  Follow your health care provider's instructions about healthy diet, exercising, and getting tested or screened for diseases.  Follow your health care provider's instructions on monitoring your cholesterol and blood pressure.  This information is not intended to replace advice given to you by your health care provider. Make sure you discuss any questions you have with your health care provider.  Document Revised: 02/20/2021 Document Reviewed: 02/20/2021  Elsevier Patient Education  2024 [employer].

## 2024-06-09 NOTE — Progress Notes (Signed)
 Subjective:    Patient ID: Peter Rose, male    DOB: [birth year], 68 y.o.   MRN: [id number]  HPI  Patient presents to clinic today for his annual exam.  Of note, his BP today is 142/88.  He is taking lisinopril  HCT 10-12.5 mg daily.  He reports he just started taking a whole tablet instead of 1/2 tablet.  Flu: Never Tetanus: 05/2012 COVID: Never Pneumovax: Never Prevnar: Never Shingrix: Never PSA screening: 04/2023 Colon screening: never Vision screening: annually Dentist: biannually  Diet: He does eat meat. He consumes fruits and veggies. He does eat fried foods. He drinks mostly sweet tea, water, coffee. Exercise: Walking   Review of Systems     Past Medical History:  Diagnosis Date   Complication of anesthesia    abdominal distention (brief ileus ves opioid induced constipation) following 09/10/17 knee arthroscopy   COVID    GERD (gastroesophageal reflux disease)    occasional   Medical history non-contributory    Psoriasis     Current Outpatient Medications  Medication Sig Dispense Refill   aspirin  EC 81 MG tablet Take 1 tablet (81 mg total) by mouth daily. Swallow whole. 30 tablet 12   atorvastatin  (LIPITOR) 10 MG tablet Take 1 tablet (10 mg total) by mouth daily. 90 tablet 1   ibuprofen  (ADVIL ) 200 MG tablet Take 600-800 mg by mouth every 6 (six) hours as needed for moderate pain.     levocetirizine (XYZAL ) 5 MG tablet TAKE 1 TABLET BY MOUTH AT BEDTIME AS NEEDED FOR ALLERGIES 90 tablet 0   lisinopril -hydrochlorothiazide  (ZESTORETIC ) 10-12.5 MG tablet TAKE 1/2 TABLET BY MOUTH DAILY 45 tablet 0   methotrexate  (RHEUMATREX) 2.5 MG tablet TAKE 10 TABLETS BY MOUTH ONCE A WEEK WHEN NEEDED FOR PSORIASIS FLARE. 128 tablet 0   predniSONE  (DELTASONE ) 10 MG tablet Take 6 tabs on day 1, 5 tabs on day 2, 4 tabs on day 3, 3 tabs on day 4, 2 tabs on day 5, 1 tab on day 6 (Patient not taking: Reported on 10/25/2023) 21 tablet 0   sildenafil  (VIAGRA ) 100 MG tablet TAKE 1 TABLET BY  MOUTH AS NEEDED FOR ERECTILE DYSFUNCTION 10 tablet 3   traZODone  (DESYREL ) 100 MG tablet TAKE 2 TABLETS BY MOUTH EVERY NIGHT AT BEDTIME 180 tablet 0   No current facility-administered medications for this visit.    Allergies  Allergen Reactions   Codeine Other (See Comments)    feels wired and jittery   Hycodan [Hydrocodone  Bit-Homatrop Mbr]     Makes pt feel weird/jittery   Vicodin [Hydrocodone -Acetaminophen ] Other (See Comments)    Pt feels wired with pain meds    Family History  Problem Relation Age of Onset   Colon cancer Neg Hx    Prostate cancer Neg Hx     Social History   Socioeconomic History   Marital status: Single    Spouse name: Not on file   Number of children: 3   Years of education: 90   Highest education level: Not on file  Occupational History   Occupation: [employer]  Tobacco Use   Smoking status: Former    Current packs/day: 0.00    Types: Cigarettes    Quit date: 01/06/2017    Years since quitting: 7.4   Smokeless tobacco: Never  Vaping Use   Vaping status: Never Used  Substance and Sexual Activity   Alcohol use: Yes    Comment: occasional   Drug use: No   Sexual activity: Not on  file  Other Topics Concern   Not on file  Social History Narrative   Lives alone   Caffeine use:  2 cups - coffee   Tea daily   Social Drivers of Health   Financial Resource Strain: Low Risk  (10/25/2023)   Overall Financial Resource Strain (CARDIA)    Difficulty of Paying Living Expenses: Not hard at all  Food Insecurity: No Food Insecurity (10/25/2023)   Hunger Vital Sign    Worried About Running Out of Food in the Last Year: Never true    Ran Out of Food in the Last Year: Never true  Transportation Needs: No Transportation Needs (10/25/2023)   PRAPARE - Administrator, Civil Service (Medical): No    Lack of Transportation (Non-Medical): No  Physical Activity: Insufficiently Active (10/25/2023)   Exercise Vital Sign    Days of Exercise per Week:  2 days    Minutes of Exercise per Session: 20 min  Stress: No Stress Concern Present (10/25/2023)   [employer] of Occupational Health - Occupational Stress Questionnaire    Feeling of Stress : Not at all  Social Connections: Moderately Integrated (10/25/2023)   Social Connection and Isolation Panel    Frequency of Communication with Friends and Family: More than three times a week    Frequency of Social Gatherings with Friends and Family: Twice a week    Attends Religious Services: Never    Database administrator or Organizations: Yes    Attends Engineer, structural: More than 4 times per year    Marital Status: Married  Catering manager Violence: Not At Risk (10/25/2023)   Humiliation, Afraid, Rape, and Kick questionnaire    Fear of Current or Ex-Partner: No    Emotionally Abused: No    Physically Abused: No    Sexually Abused: No     Constitutional: Denies fever, malaise, fatigue, headache or abrupt weight changes.  HEENT: Denies eye pain, eye redness, ear pain, ringing in the ears, wax buildup, runny nose, nasal congestion, bloody nose, or sore throat. Respiratory: Denies difficulty breathing, shortness of breath, cough or sputum production.   Cardiovascular: Denies chest pain, chest tightness, palpitations or swelling in the hands or feet.  Gastrointestinal: Pt reports intermittent diarrhea. Denies abdominal pain, bloating, constipation, or blood in the stool.  GU: Patient reports decreased libido.  Denies urgency, frequency, pain with urination, burning sensation, blood in urine, odor or discharge. Musculoskeletal:  Denies decrease in range of motion, difficulty with gait, or joint pain or swelling.  Skin: Patient reports psoriasis.  Denies redness, rashes, lesions or ulcercations.  Neurological: Patient reports insomnia.  Denies dizziness, difficulty with memory, difficulty with speech or problems with balance and coordination.  Psych: Patient has a history of  anxiety.  Denies depression, SI/HI.  No other specific complaints in a complete review of systems (except as listed in HPI above).  Objective:   Physical Exam  BP (!) 142/88 (BP Location: Left Arm, Patient Position: Sitting, Cuff Size: Normal)   Ht 5' 10 (1.778 m)   Wt 205 lb 9.6 oz (93.3 kg)   BMI 29.50 kg/m    Wt Readings from Last 3 Encounters:  10/21/23 212 lb 9.6 oz (96.4 kg)  08/08/23 223 lb (101.2 kg)  05/08/23 212 lb (96.2 kg)    General: Appears his stated age, overweight, in NAD. Skin: Warm, dry and intact. No ulcerations noted. HEENT: Head: normal shape and size; Eyes: sclera white, no icterus, conjunctiva pink, PERRLA and  EOMs intact;  Neck:  Neck supple, trachea midline. No masses, lumps or thyromegaly present.  Cardiovascular: Normal rate and rhythm. S1,S2 noted.  No murmur, rubs or gallops noted. No JVD or BLE edema. No carotid bruits noted. Pulmonary/Chest: Normal effort and positive vesicular breath sounds. No respiratory distress. No wheezes, rales or ronchi noted.  Abdomen: Normal bowel sounds.  Musculoskeletal: Strength 4/5 RUE, 5/5 LUE.  Strength 5/5 BLE.  No difficulty with gait.  Neurological: Alert and oriented. Cranial nerves II-XII grossly intact. Coordination normal.  Psychiatric: Mood and affect normal. Behavior is normal. Judgment and thought content normal.     BMET    Component Value Date/Time   NA 138 10/21/2023 0856   K 4.2 10/21/2023 0856   CL 99 10/21/2023 0856   CO2 28 10/21/2023 0856   GLUCOSE 99 10/21/2023 0856   BUN 22 10/21/2023 0856   CREATININE 1.29 10/21/2023 0856   CALCIUM  10.0 10/21/2023 0856   GFRNONAA 59 (L) 09/12/2017 0355   GFRAA >60 09/12/2017 0355    Lipid Panel     Component Value Date/Time   CHOL 158 10/21/2023 0856   TRIG 140 10/21/2023 0856   HDL 39 (L) 10/21/2023 0856   CHOLHDL 4.1 10/21/2023 0856   VLDL 19.4 12/17/2019 1124   LDLCALC 95 10/21/2023 0856    CBC    Component Value Date/Time   WBC 5.4  10/21/2023 0856   RBC 4.69 10/21/2023 0856   HGB 14.9 10/21/2023 0856   HCT 44.4 10/21/2023 0856   PLT 236 10/21/2023 0856   MCV 94.7 10/21/2023 0856   MCH 31.8 10/21/2023 0856   MCHC 33.6 10/21/2023 0856   RDW 13.7 10/21/2023 0856   LYMPHSABS 1.7 09/10/2017 0612   MONOABS 0.6 09/10/2017 0612   EOSABS 0.2 09/10/2017 0612   BASOSABS 0.1 09/10/2017 0612    Hgb A1C Lab Results  Component Value Date   HGBA1C 5.6 10/21/2023           Assessment & Plan:   Preventative health maintenance:  Encouraged him to get a flu shot in the fall He declines tetanus for financial reasons, advised him if he gets bit or cut to go get this done Encouraged him to get his COVID-vaccine He declines Pneumovax or Prevnar at this time Discussed Shingrix vaccine, he will check coverage with his insurance company schedule visit if he would like to have this done Cologuard ordered Encouraged him to consume a balanced diet and exercise regimen Advised him to see an eye doctor and dentist annually We will check CBC, c-Met, lipid, A1c and PSA today  Low libido:  Will check testosterone  Advised him if this is low, then will refer to urology for further evaluation.  RTC in 6 months, follow-up chronic conditions Angeline Laura, NP

## 2024-06-09 NOTE — Assessment & Plan Note (Signed)
 Encouraged diet and exercise for weight loss ?

## 2024-06-09 NOTE — Assessment & Plan Note (Signed)
 Initial BP elevated but manual repeat improved If remains elevated, will consider increasing lisinopril  HCT to 20-25 mg daily Will continue lisinopril  10-12.5 mg daily at this time

## 2024-06-10 ENCOUNTER — Ambulatory Visit: Payer: Self-pay | Admitting: Internal Medicine

## 2024-06-10 DIAGNOSIS — R195 Other fecal abnormalities: Secondary | ICD-10-CM

## 2024-06-10 DIAGNOSIS — R972 Elevated prostate specific antigen [PSA]: Secondary | ICD-10-CM

## 2024-06-11 LAB — COMPREHENSIVE METABOLIC PANEL WITH GFR
AG Ratio: 1.6 (calc) (ref 1.0–2.5)
ALT: 27 U/L (ref 9–46)
AST: 25 U/L (ref 10–35)
Albumin: 4.5 g/dL (ref 3.6–5.1)
Alkaline phosphatase (APISO): 50 U/L (ref 35–144)
BUN: 16 mg/dL (ref 7–25)
CO2: 28 mmol/L (ref 20–32)
Calcium: 9.6 mg/dL (ref 8.6–10.3)
Chloride: 103 mmol/L (ref 98–110)
Creat: 1.12 mg/dL (ref 0.70–1.35)
Globulin: 2.8 g/dL (ref 1.9–3.7)
Glucose, Bld: 113 mg/dL — ABNORMAL HIGH (ref 65–99)
Potassium: 4.1 mmol/L (ref 3.5–5.3)
Sodium: 139 mmol/L (ref 135–146)
Total Bilirubin: 0.4 mg/dL (ref 0.2–1.2)
Total Protein: 7.3 g/dL (ref 6.1–8.1)
eGFR: 72 mL/min/1.73m2 (ref >=60)

## 2024-06-11 LAB — CBC
HCT: 41.7 % (ref 38.5–50.0)
Hemoglobin: 14 g/dL (ref 13.2–17.1)
MCH: 32.3 pg (ref 27.0–33.0)
MCHC: 33.6 g/dL (ref 32.0–36.0)
MCV: 96.3 fL (ref 80.0–100.0)
MPV: 9.1 fL (ref 7.5–12.5)
Platelets: 191 Thousand/uL (ref 140–400)
RBC: 4.33 Million/uL (ref 4.20–5.80)
RDW: 13.6 % (ref 11.0–15.0)
WBC: 4.2 Thousand/uL (ref 3.8–10.8)

## 2024-06-11 LAB — HEMOGLOBIN A1C
Hgb A1c MFr Bld: 5.7 % — ABNORMAL HIGH (ref <5.7)
Mean Plasma Glucose: 117 mg/dL
eAG (mmol/L): 6.5 mmol/L

## 2024-06-11 LAB — LIPID PANEL
Cholesterol: 189 mg/dL (ref <200)
HDL: 51 mg/dL (ref >=40)
LDL Cholesterol (Calc): 100 mg/dL — ABNORMAL HIGH
Non-HDL Cholesterol (Calc): 138 mg/dL — ABNORMAL HIGH (ref <130)
Total CHOL/HDL Ratio: 3.7 (calc) (ref <5.0)
Triglycerides: 265 mg/dL — ABNORMAL HIGH (ref <150)

## 2024-06-11 LAB — TESTOSTERONE TOTAL,FREE,BIO, MALES
Albumin: 4.5 g/dL (ref 3.6–5.1)
Sex Hormone Binding: 76 nmol/L (ref 22–77)
Testosterone, Bioavailable: 99.3 ng/dL — ABNORMAL LOW (ref 110.0–575.0)
Testosterone, Free: 48.3 pg/mL (ref 46.0–224.0)
Testosterone: 728 ng/dL (ref 250–827)

## 2024-06-11 LAB — PSA: PSA: 4.14 ng/mL — ABNORMAL HIGH (ref <=4.00)

## 2024-06-16 MED ORDER — ATORVASTATIN CALCIUM 10 MG PO TABS: 10.0000 mg | ORAL_TABLET | Freq: Every day | ORAL | 1 refills | Status: AC

## 2024-06-16 NOTE — Telephone Encounter (Signed)
 Atorvastatin  refilled, referral to urology placed

## 2024-06-18 ENCOUNTER — Telehealth: Payer: Self-pay

## 2024-06-18 NOTE — Telephone Encounter (Signed)
 Copied from CRM [id number]. Topic: Referral - Request for Referral >> Jun 17, 2024  4:46 PM Debby BROCKS wrote: Did the patient discuss referral with their provider in the last year? Yes (If No - schedule appointment) (If Yes - send message)  Appointment offered? No  Type of order/referral and detailed reason for visit: Urology  Preference of office, provider, location: N/A just preferred in [place]  If referral order, have you been seen by this specialty before? No (If Yes, this issue or another issue? When? Where?  Can we respond through MyChart? No, A call is preferred

## 2024-06-18 NOTE — Telephone Encounter (Signed)
 Referral to urology was placed 9/2

## 2024-06-30 NOTE — Progress Notes (Signed)
 07/09/24 11:12 AM   Peter Rose 1956-03-22 [id number]  CC: elevated PSA   HPI: 68 year old male here for evaluation of elevated PSA Previously seen in this clinic in 2021 with Dr. Francisca for elevated PSA   -Patient declined further workup due to no insurance at the time   Latest Reference Range & Units 05/28/12 11:29 01/31/16 07:39 12/26/16 08:27 12/17/19 11:24 12/14/21 08:27 05/08/23 14:24 06/09/24 09:23  PSA < OR = 4.00 ng/mL 4.30 (H) 4.75 (H) 5.83 (H) 6.50 (H) 4.59 (H) 3.43 4.14 (H)  (H): Data is abnormally high  No family history of GU malignancies, no family history of prostate cancer Denies acute change in urinary habits, prostatitis, GH, nephrolithiasis   PMH: Past Medical History:  Diagnosis Date   Complication of anesthesia    abdominal distention (brief ileus ves opioid induced constipation) following 09/10/17 knee arthroscopy   COVID    GERD (gastroesophageal reflux disease)    occasional   Medical history non-contributory    Psoriasis     Surgical History: Past Surgical History:  Procedure Laterality Date   ELBOW SURGERY Right    ligament repar 11/2017   KNEE ARTHROSCOPY WITH ANTERIOR CRUCIATE LIGAMENT (ACL) REPAIR WITH HAMSTRING GRAFT Right 09/10/2017   Procedure: KNEE ARTHROSCOPY WITH ANTERIOR CRUCIATE LIGAMENT (ACL) REPAIR WITH HAMSTRING GRAFT AND  ALLOGRAFT ,& MEDIAL MENISCECTOMY;  Surgeon: Marchia Drivers, MD;  Location: [facility] ORS;  Service: Orthopedics;  Laterality: Right;   LUMBAR LAMINECTOMY/DECOMPRESSION MICRODISCECTOMY Right 09/30/2020   Procedure: Right Lumbar four-five Laminectomy for facet/synovial cyst;  Surgeon: Colon Shove, MD;  [facility]: [facility];  Service: Neurosurgery;  Laterality: Right;   MELANOMA EXCISION Left 11/23/2021   Left Leg   MOHS SURGERY  11/14/2021    Family History: Family History  Problem Relation Age of Onset   Colon cancer Neg Hx    Prostate cancer Neg Hx     Social History:  reports that he quit smoking about  7 years ago. His smoking use included cigarettes. He has never used smokeless tobacco. He reports current alcohol use. He reports that he does not use drugs.      Physical Exam: BP (!) 138/92   Pulse 99   Ht 5' 10 (1.778 m)   Wt 204 lb (92.5 kg)   BMI 29.27 kg/m    Constitutional:  Alert and oriented, No acute distress. Cardiovascular: No clubbing, cyanosis, or edema. Respiratory: Normal respiratory effort, no increased work of breathing. GI: Nondistended GU: DRE -40-50 g prostate, smooth and symmetric bilaterally, no nodules, no rectal blood Skin: No rashes, bruises or suspicious lesions. Neurologic: Grossly intact, no focal deficits, moving all 4 extremities. Psychiatric: Normal mood and affect.  Laboratory Data:  Latest Reference Range & Units 05/28/12 11:29 01/31/16 07:39 12/26/16 08:27 12/17/19 11:24 12/14/21 08:27 05/08/23 14:24 06/09/24 09:23  PSA < OR = 4.00 ng/mL 4.30 (H) 4.75 (H) 5.83 (H) 6.50 (H) 4.59 (H) 3.43 4.14 (H)  (H): Data is abnormally high   Pertinent Imaging: N/A    Assessment & Plan:    Elevated PSA Assessment & Plan: PSA 4.14 (06/09/2024) Baseline 4.1-6.5 (since 2013)   I reviewed his clinical history and PSA data, all the way back to 2013.  He has maintained chronically elevated PSA values, just above age based thresholds.  However, it is fairly reassuring his PSA has remained stable for over 10 years--I would estimate a much lower likelihood of underlying malignancy, and favor BPH.   - PHI draw today.  Although I expect  a consistently low risk value - I will communicate any actionable results  - Overall not worried-May continue annual PSA screening - if concerning PHI or future PSA off baseline - will proceed with prostate MRI  Orders: -     Prostate Health Index      Penne Skye, MD 07/09/2024  [facility] [address], [address] [place], [place] [address] [phone]

## 2024-06-30 NOTE — Assessment & Plan Note (Addendum)
 PSA 4.14 (06/09/2024) Baseline 4.1-6.5 (since 2013)   I reviewed his clinical history and PSA data, all the way back to 2013.  He has maintained chronically elevated PSA values, just above age based thresholds.  However, it is fairly reassuring his PSA has remained stable for over 10 years--I would estimate a much lower likelihood of underlying malignancy, and favor BPH.   - PHI draw today.  Although I expect a consistently low risk value - I will communicate any actionable results  - Overall not worried-May continue annual PSA screening - if concerning PHI or future PSA off baseline - will proceed with prostate MRI

## 2024-07-04 ENCOUNTER — Other Ambulatory Visit: Payer: Self-pay | Admitting: Internal Medicine

## 2024-07-06 NOTE — Telephone Encounter (Signed)
 Requested Prescriptions  Pending Prescriptions Disp Refills   levocetirizine (XYZAL ) 5 MG tablet [Pharmacy Med Name: LEVOCETIRIZINE 5MG  TABLETS] 90 tablet 0    Sig: TAKE 1 TABLET BY MOUTH AT BEDTIME AS NEEDED FOR ALLERGIES     Ear, Nose, and Throat:  Antihistamines - levocetirizine dihydrochloride  Passed - 07/06/2024 11:51 AM      Passed - Cr in normal range and within 360 days    Creat  Date Value Ref Range Status  06/09/2024 1.12 0.70 - 1.35 mg/dL Final         Passed - eGFR is 10 or above and within 360 days    GFR calc Af Amer  Date Value Ref Range Status  09/12/2017 >60 >60 mL/min Final    Comment:    (NOTE) The eGFR has been calculated using the CKD EPI equation. This calculation has not been validated in all clinical situations. eGFR's persistently <60 mL/min signify possible Chronic Kidney Disease.    GFR calc non Af Amer  Date Value Ref Range Status  09/12/2017 59 (L) >60 mL/min Final   GFR  Date Value Ref Range Status  10/28/2020 72.21 >60.00 mL/min Final    Comment:    Calculated using the CKD-EPI Creatinine Equation (2021)   eGFR  Date Value Ref Range Status  06/09/2024 72 > OR = 60 mL/min/1.55m2 Final         Passed - Valid encounter within last 12 months    Recent Outpatient Visits           3 weeks ago Encounter for general adult medical examination with abnormal findings   [facility] [place], Angeline ORN, NP       Future Appointments             In 3 days Georganne, Penne SAUNDERS, MD [facility]

## 2024-07-09 ENCOUNTER — Other Ambulatory Visit: Payer: Self-pay

## 2024-07-09 ENCOUNTER — Ambulatory Visit: Admitting: Urology

## 2024-07-09 VITALS — BP 138/92 | HR 99 | Ht 70.0 in | Wt 204.0 lb

## 2024-07-09 DIAGNOSIS — R972 Elevated prostate specific antigen [PSA]: Secondary | ICD-10-CM | POA: Diagnosis not present

## 2024-07-09 NOTE — Addendum Note (Signed)
 Addended byBETHA CORIE PLATER on: 07/09/2024 11:13 AM   Modules accepted: Orders

## 2024-07-11 LAB — PHI SCORE REFLEX
% Free PSA: 27.5 %
PSA, Free: 1.19 ng/mL
Prostate Heath Index Score: 29.7
p2PSA: 17 pg/mL

## 2024-07-11 LAB — PROSTATE HEALTH INDEX: Prostate Specific Ag: 4.3 ng/mL — ABNORMAL HIGH (ref 0.0–3.9)

## 2024-07-12 LAB — COLOGUARD: COLOGUARD: POSITIVE — AB

## 2024-07-13 ENCOUNTER — Encounter: Payer: Self-pay | Admitting: Internal Medicine

## 2024-07-13 ENCOUNTER — Ambulatory Visit: Payer: Self-pay | Admitting: Urology

## 2024-07-13 NOTE — Progress Notes (Signed)
 Your recent PSA numbers are stable, including the more sophisticated PSA test. This is reassuring, but does not fully exclude an underlying cancer. I would be fine to repeat your PSA in ~4-6 months with close monitoring. We can decide at any point to be more aggressive, and proceed with a prostate MRI.   Dr. Georganne

## 2024-07-13 NOTE — Addendum Note (Signed)
 Addended by: ANTONETTE ANGELINE ORN on: 07/13/2024 03:38 PM   Modules accepted: Orders

## 2024-07-21 ENCOUNTER — Other Ambulatory Visit: Payer: Self-pay

## 2024-07-21 ENCOUNTER — Telehealth: Payer: Self-pay

## 2024-07-21 DIAGNOSIS — Z1211 Encounter for screening for malignant neoplasm of colon: Secondary | ICD-10-CM

## 2024-07-21 DIAGNOSIS — R195 Other fecal abnormalities: Secondary | ICD-10-CM

## 2024-07-21 MED ORDER — NA SULFATE-K SULFATE-MG SULF 17.5-3.13-1.6 GM/177ML PO SOLN: 1.0000 | Freq: Once | ORAL | 0 refills | Status: AC

## 2024-07-21 NOTE — Telephone Encounter (Signed)
 Gastroenterology Pre-Procedure Review  Request Date: 08/20/24 Requesting Physician: Dr. Jinny  PATIENT REVIEW QUESTIONS: The patient responded to the following health history questions as indicated:    1. Are you having any GI issues? Positive cologuard, 1st colonoscopy no GI Issues 2. Do you have a personal history of Polyps? no 3. Do you have a family history of Colon Cancer or Polyps? no 4. Diabetes Mellitus? no 5. Joint replacements in the past 12 months?no 6. Major health problems in the past 3 months?no 7. Any artificial heart valves, MVP, or defibrillator?no    MEDICATIONS & ALLERGIES:    Patient reports the following regarding taking any anticoagulation/antiplatelet therapy:   Plavix, Coumadin, Eliquis, Xarelto, Lovenox , Pradaxa, Brilinta, or Effient? no Aspirin ? 81 mg daily  Patient confirms/reports the following medications:  Current Outpatient Medications  Medication Sig Dispense Refill   aspirin  EC 81 MG tablet Take 1 tablet (81 mg total) by mouth daily. Swallow whole. 30 tablet 12   atorvastatin  (LIPITOR) 10 MG tablet Take 1 tablet (10 mg total) by mouth daily. 90 tablet 1   cyclobenzaprine  (FLEXERIL ) 5 MG tablet Take 5 mg by mouth 2 (two) times daily.     ibuprofen  (ADVIL ) 200 MG tablet Take 600-800 mg by mouth every 6 (six) hours as needed for moderate pain.     levocetirizine (XYZAL ) 5 MG tablet TAKE 1 TABLET BY MOUTH AT BEDTIME AS NEEDED FOR ALLERGIES 90 tablet 0   lisinopril -hydrochlorothiazide  (ZESTORETIC ) 10-12.5 MG tablet TAKE 1/2 TABLET BY MOUTH DAILY 45 tablet 0   MEDROL  4 MG TBPK tablet Take by mouth as directed.     meloxicam (MOBIC) 15 MG tablet Take 15 mg by mouth daily.     methotrexate  (RHEUMATREX) 2.5 MG tablet TAKE 10 TABLETS BY MOUTH ONCE A WEEK WHEN NEEDED FOR PSORIASIS FLARE. 90 tablet 1   sildenafil  (VIAGRA ) 100 MG tablet TAKE 1 TABLET BY MOUTH AS NEEDED FOR ERECTILE DYSFUNCTION 10 tablet 3   traMADol  (ULTRAM ) 50 MG tablet Take 50 mg by mouth every 6  (six) hours as needed.     traZODone  (DESYREL ) 100 MG tablet TAKE 2 TABLETS BY MOUTH EVERY NIGHT AT BEDTIME 180 tablet 0   No current facility-administered medications for this visit.    Patient confirms/reports the following allergies:  Allergies  Allergen Reactions   Codeine Other (See Comments)    feels wired and jittery   Hycodan [Hydrocodone  Bit-Homatrop Mbr]     Makes pt feel weird/jittery   Vicodin [Hydrocodone -Acetaminophen ] Other (See Comments)    Pt feels wired with pain meds    No orders of the defined types were placed in this encounter.   AUTHORIZATION INFORMATION Primary Insurance: 1D#: Group #:  Secondary Insurance: 1D#: Group #:  SCHEDULE INFORMATION: Date: 08/20/24 Time: Location: [facility]

## 2024-08-16 ENCOUNTER — Other Ambulatory Visit: Payer: Self-pay | Admitting: Internal Medicine

## 2024-08-16 DIAGNOSIS — F5101 Primary insomnia: Secondary | ICD-10-CM

## 2024-08-18 NOTE — Telephone Encounter (Signed)
 Requested Prescriptions  Pending Prescriptions Disp Refills   traZODone  (DESYREL ) 100 MG tablet [Pharmacy Med Name: TRAZODONE  100MG  TABLETS] 180 tablet 0    Sig: TAKE 2 TABLETS BY MOUTH EVERY NIGHT AT BEDTIME     Psychiatry: Antidepressants - Serotonin Modulator Passed - 08/18/2024 12:10 PM      Passed - Valid encounter within last 6 months    Recent Outpatient Visits           2 months ago Encounter for general adult medical examination with abnormal findings   [facility] [place], Angeline ORN, NP       Future Appointments             In 10 months Georganne, Penne SAUNDERS, MD [facility] [place]

## 2024-08-20 ENCOUNTER — Other Ambulatory Visit: Payer: Self-pay

## 2024-08-20 ENCOUNTER — Ambulatory Visit: Admitting: Anesthesiology

## 2024-08-20 ENCOUNTER — Encounter: Payer: Self-pay | Admitting: Gastroenterology

## 2024-08-20 ENCOUNTER — Ambulatory Visit
Admission: RE | Admit: 2024-08-20 | Discharge: 2024-08-20 | Disposition: A | Discharge status: Home / Self Care | Attending: Gastroenterology | Admitting: Gastroenterology

## 2024-08-20 ENCOUNTER — Encounter
Admission: RE | Disposition: A | Discharge status: Home / Self Care | Payer: Self-pay | Source: Home / Self Care | Attending: Gastroenterology

## 2024-08-20 DIAGNOSIS — K621 Rectal polyp: Secondary | ICD-10-CM | POA: Insufficient documentation

## 2024-08-20 DIAGNOSIS — D123 Benign neoplasm of transverse colon: Secondary | ICD-10-CM | POA: Insufficient documentation

## 2024-08-20 DIAGNOSIS — R195 Other fecal abnormalities: Secondary | ICD-10-CM | POA: Diagnosis not present

## 2024-08-20 DIAGNOSIS — Z87891 Personal history of nicotine dependence: Secondary | ICD-10-CM | POA: Diagnosis not present

## 2024-08-20 DIAGNOSIS — I10 Essential (primary) hypertension: Secondary | ICD-10-CM | POA: Diagnosis not present

## 2024-08-20 DIAGNOSIS — K5730 Diverticulosis of large intestine without perforation or abscess without bleeding: Secondary | ICD-10-CM | POA: Insufficient documentation

## 2024-08-20 DIAGNOSIS — K635 Polyp of colon: Secondary | ICD-10-CM | POA: Diagnosis not present

## 2024-08-20 DIAGNOSIS — Z1211 Encounter for screening for malignant neoplasm of colon: Secondary | ICD-10-CM | POA: Insufficient documentation

## 2024-08-20 HISTORY — PX: POLYPECTOMY: SHX149

## 2024-08-20 HISTORY — PX: COLONOSCOPY: SHX5424

## 2024-08-20 SURGERY — COLONOSCOPY
Anesthesia: General

## 2024-08-20 MED ORDER — PROPOFOL 1000 MG/100ML IV EMUL
INTRAVENOUS | Status: AC
  Filled 2024-08-20: qty 100

## 2024-08-20 MED ORDER — PROPOFOL 500 MG/50ML IV EMUL
INTRAVENOUS | Status: DC | PRN
  Administered 2024-08-20: 150 ug/kg/min via INTRAVENOUS

## 2024-08-20 MED ORDER — SODIUM CHLORIDE 0.9 % IV SOLN: INTRAVENOUS | Status: DC

## 2024-08-20 MED ORDER — PROPOFOL 10 MG/ML IV BOLUS
INTRAVENOUS | Status: DC | PRN
  Administered 2024-08-20: 100 mg via INTRAVENOUS

## 2024-08-20 NOTE — H&P (Signed)
 Rogelia Copping, MD [facility] [address], [address] [place], [place] [address] Phone: [phone] Fax : [phone]  Primary Care Physician:  Antonette Angeline ORN, NP Primary Gastroenterologist:  Dr. Copping  Pre-Procedure History & Physical: HPI:  Peter Rose is a 68 y.o. male is here for a screening colonoscopy.   Past Medical History:  Diagnosis Date   Complication of anesthesia    abdominal distention (brief ileus ves opioid induced constipation) following 09/10/17 knee arthroscopy   COVID    GERD (gastroesophageal reflux disease)    occasional   Medical history non-contributory    Psoriasis     Past Surgical History:  Procedure Laterality Date   ELBOW SURGERY Right    ligament repar 11/2017   KNEE ARTHROSCOPY WITH ANTERIOR CRUCIATE LIGAMENT (ACL) REPAIR WITH HAMSTRING GRAFT Right 09/10/2017   Procedure: KNEE ARTHROSCOPY WITH ANTERIOR CRUCIATE LIGAMENT (ACL) REPAIR WITH HAMSTRING GRAFT AND  ALLOGRAFT ,& MEDIAL MENISCECTOMY;  Surgeon: Marchia Drivers, MD;  Location: [facility] ORS;  Service: Orthopedics;  Laterality: Right;   LUMBAR LAMINECTOMY/DECOMPRESSION MICRODISCECTOMY Right 09/30/2020   Procedure: Right Lumbar four-five Laminectomy for facet/synovial cyst;  Surgeon: Colon Shove, MD;  Location: [facility] OR;  Service: Neurosurgery;  Laterality: Right;   MELANOMA EXCISION Left 11/23/2021   Left Leg   MOHS SURGERY  11/14/2021    Prior to Admission medications   Medication Sig Start Date End Date Taking? Authorizing Provider  lisinopril -hydrochlorothiazide  (ZESTORETIC ) 10-12.5 MG tablet TAKE 1/2 TABLET BY MOUTH DAILY 03/31/24  Yes Antonette Angeline ORN, NP  aspirin  EC 81 MG tablet Take 1 tablet (81 mg total) by mouth daily. Swallow whole. 09/21/22   Antonette Angeline ORN, NP  atorvastatin  (LIPITOR) 10 MG tablet Take 1 tablet (10 mg total) by mouth daily. 06/16/24   Antonette Angeline ORN, NP  cyclobenzaprine  (FLEXERIL ) 5 MG tablet Take 5 mg by mouth 2 (two) times daily. 06/03/24   [provider]   ibuprofen  (ADVIL ) 200 MG tablet Take 600-800 mg by mouth every 6 (six) hours as needed for moderate pain.    [provider]  levocetirizine (XYZAL ) 5 MG tablet TAKE 1 TABLET BY MOUTH AT BEDTIME AS NEEDED FOR ALLERGIES 07/06/24   Antonette Angeline ORN, NP  MEDROL  4 MG TBPK tablet Take by mouth as directed. 06/22/24   [provider]  meloxicam (MOBIC) 15 MG tablet Take 15 mg by mouth daily. 06/03/24   [provider]  methotrexate  (RHEUMATREX) 2.5 MG tablet TAKE 10 TABLETS BY MOUTH ONCE A WEEK WHEN NEEDED FOR PSORIASIS FLARE. 06/09/24   Antonette Angeline ORN, NP  sildenafil  (VIAGRA ) 100 MG tablet TAKE 1 TABLET BY MOUTH AS NEEDED FOR ERECTILE DYSFUNCTION 04/03/24   Antonette Angeline ORN, NP  traMADol  (ULTRAM ) 50 MG tablet Take 50 mg by mouth every 6 (six) hours as needed. 07/07/24   [provider]  traZODone  (DESYREL ) 100 MG tablet TAKE 2 TABLETS BY MOUTH EVERY NIGHT AT BEDTIME 08/18/24   Antonette Angeline ORN, NP    Allergies as of 07/21/2024 - Review Complete 07/21/2024  Allergen Reaction Noted   Codeine Other (See Comments) 09/29/2013   Hycodan [hydrocodone  bit-homatrop mbr]  09/27/2020   Vicodin [hydrocodone -acetaminophen ] Other (See Comments) 05/28/2012    Family History  Problem Relation Age of Onset   Colon cancer Neg Hx    Prostate cancer Neg Hx     Social History   Socioeconomic History   Marital status: Single    Spouse name: Not on file   Number of children: 3  Years of education: 98   Highest education level: Not on file  Occupational History   Occupation: [employer]  Tobacco Use   Smoking status: Former    Current packs/day: 0.00    Types: Cigarettes    Quit date: 01/06/2017    Years since quitting: 7.6   Smokeless tobacco: Never  Vaping Use   Vaping status: Never Used  Substance and Sexual Activity   Alcohol use: Yes    Comment: occasional   Drug use: No   Sexual activity: Not on file  Other Topics Concern   Not on file  Social History Narrative    Lives alone   Caffeine use:  2 cups - coffee   Tea daily   Social Drivers of Health   Financial Resource Strain: Low Risk  (10/25/2023)   Overall Financial Resource Strain (CARDIA)    Difficulty of Paying Living Expenses: Not hard at all  Food Insecurity: No Food Insecurity (10/25/2023)   Hunger Vital Sign    Worried About Running Out of Food in the Last Year: Never true    Ran Out of Food in the Last Year: Never true  Transportation Needs: No Transportation Needs (10/25/2023)   PRAPARE - Administrator, Civil Service (Medical): No    Lack of Transportation (Non-Medical): No  Physical Activity: Insufficiently Active (10/25/2023)   Exercise Vital Sign    Days of Exercise per Week: 2 days    Minutes of Exercise per Session: 20 min  Stress: No Stress Concern Present (10/25/2023)   [employer] of Occupational Health - Occupational Stress Questionnaire    Feeling of Stress : Not at all  Social Connections: Moderately Integrated (10/25/2023)   Social Connection and Isolation Panel    Frequency of Communication with Friends and Family: More than three times a week    Frequency of Social Gatherings with Friends and Family: Twice a week    Attends Religious Services: Never    Database Administrator or Organizations: Yes    Attends Engineer, Structural: More than 4 times per year    Marital Status: Married  Catering Manager Violence: Not At Risk (10/25/2023)   Humiliation, Afraid, Rape, and Kick questionnaire    Fear of Current or Ex-Partner: No    Emotionally Abused: No    Physically Abused: No    Sexually Abused: No    Review of Systems: See HPI, otherwise negative ROS  Physical Exam: BP (!) 141/91   Pulse 80   Temp (!) 96.5 F (35.8 C) (Tympanic)   Resp 16   Ht 5' 10 (1.778 m)   Wt 93.8 kg   SpO2 98%   BMI 29.67 kg/m  General:   Alert,  pleasant and cooperative in NAD Head:  Normocephalic and atraumatic. Neck:  Supple; no masses or  thyromegaly. Lungs:  Clear throughout to auscultation.    Heart:  Regular rate and rhythm. Abdomen:  Soft, nontender and nondistended. Normal bowel sounds, without guarding, and without rebound.   Neurologic:  Alert and  oriented x4;  grossly normal neurologically.  Impression/Plan: Peter Rose is now here to undergo a screening colonoscopy.  Risks, benefits, and alternatives regarding colonoscopy have been reviewed with the patient.  Questions have been answered.  All parties agreeable.

## 2024-08-20 NOTE — Anesthesia Preprocedure Evaluation (Signed)
 Anesthesia Evaluation  Patient identified by MRN, date of birth, ID band Patient awake    Reviewed: Allergy & Precautions, H&P , NPO status , Patient's Chart, lab work & pertinent test results, reviewed documented beta blocker date and time   History of Anesthesia Complications (+) history of anesthetic complications  Airway Mallampati: II   Neck ROM: full    Dental  (+) Poor Dentition   Pulmonary neg pulmonary ROS, former smoker   Pulmonary exam normal        Cardiovascular Exercise Tolerance: Good hypertension, Normal cardiovascular exam Rhythm:regular Rate:Normal     Neuro/Psych   Anxiety      Neuromuscular disease  negative psych ROS   GI/Hepatic Neg liver ROS,GERD  Medicated,,  Endo/Other  negative endocrine ROS    Renal/GU negative Renal ROS  negative genitourinary   Musculoskeletal   Abdominal   Peds  Hematology negative hematology ROS (+)   Anesthesia Other Findings Past Medical History: No date: Complication of anesthesia     Comment:  abdominal distention (brief ileus ves opioid induced               constipation) following 09/10/17 knee arthroscopy No date: COVID No date: GERD (gastroesophageal reflux disease)     Comment:  occasional No date: Medical history non-contributory No date: Psoriasis Past Surgical History: No date: ELBOW SURGERY; Right     Comment:  ligament repar 11/2017 09/10/2017: KNEE ARTHROSCOPY WITH ANTERIOR CRUCIATE LIGAMENT (ACL)  REPAIR WITH HAMSTRING GRAFT; Right     Comment:  Procedure: KNEE ARTHROSCOPY WITH ANTERIOR CRUCIATE               LIGAMENT (ACL) REPAIR WITH HAMSTRING GRAFT AND  ALLOGRAFT              ,& MEDIAL MENISCECTOMY;  Surgeon: Marchia Drivers, MD;                Location: [facility] ORS;  Service: Orthopedics;  Laterality:               Right; 09/30/2020: LUMBAR LAMINECTOMY/DECOMPRESSION MICRODISCECTOMY; Right     Comment:  Procedure: Right Lumbar four-five  Laminectomy for               facet/synovial cyst;  Surgeon: Colon Shove, MD;                Location: [facility] OR;  Service: Neurosurgery;  Laterality:               Right; 11/23/2021: MELANOMA EXCISION; Left     Comment:  Left Leg 11/14/2021: MOHS SURGERY BMI    Body Mass Index: 29.67 kg/m     Reproductive/Obstetrics negative OB ROS                              Anesthesia Physical Anesthesia Plan  ASA: 2  Anesthesia Plan: General   Post-op Pain Management:    Induction:   PONV Risk Score and Plan:   Airway Management Planned:   Additional Equipment:   Intra-op Plan:   Post-operative Plan:   Informed Consent: I have reviewed the patients History and Physical, chart, labs and discussed the procedure including the risks, benefits and alternatives for the proposed anesthesia with the patient or authorized representative who has indicated his/her understanding and acceptance.     Dental Advisory Given  Plan Discussed with: CRNA  Anesthesia Plan Comments:         Anesthesia Quick  Evaluation

## 2024-08-20 NOTE — Op Note (Signed)
 [facility] Gastroenterology Patient Name: Peter Rose Procedure Date: 08/20/2024 8:54 AM MRN: [id number] Account #: [id number] Date of Birth: Dec 07, 1955 Admit Type: Outpatient Age: 68 Room: [facility] ENDO ROOM 4 Gender: Male Note Status: Finalized Instrument Name: Colon Scope [id number] Procedure:             Colonoscopy Indications:           Screening for colorectal malignant neoplasm due to                         positive Cologuard test Providers:             Rogelia Copping MD, MD Referring MD:          Angeline MICAEL Laura (Referring MD) Medicines:             Propofol  per Anesthesia Complications:         No immediate complications. Procedure:             Pre-Anesthesia Assessment:                        - Prior to the procedure, a History and Physical was                         performed, and patient medications and allergies were                         reviewed. The patient's tolerance of previous                         anesthesia was also reviewed. The risks and benefits                         of the procedure and the sedation options and risks                         were discussed with the patient. All questions were                         answered, and informed consent was obtained. Prior                         Anticoagulants: The patient has taken no anticoagulant                         or antiplatelet agents. ASA Grade Assessment: II - A                         patient with mild systemic disease. After reviewing                         the risks and benefits, the patient was deemed in                         satisfactory condition to undergo the procedure.                        After obtaining informed consent, the colonoscope was  passed under direct vision. Throughout the procedure,                         the patient's blood pressure, pulse, and oxygen                         saturations were monitored continuously. The                          Colonoscope was introduced through the anus and                         advanced to the the cecum, identified by appendiceal                         orifice and ileocecal valve. The colonoscopy was                         performed without difficulty. The patient tolerated                         the procedure well. The quality of the bowel                         preparation was excellent. Findings:      The perianal and digital rectal examinations were normal.      A 5 mm polyp was found in the transverse colon. The polyp was sessile.       The polyp was removed with a cold snare. Resection and retrieval were       complete.      A 3 mm polyp was found in the rectum. The polyp was sessile. The polyp       was removed with a cold snare. Resection and retrieval were complete.      Many small-mouthed diverticula were found in the sigmoid colon. Impression:            - One 5 mm polyp in the transverse colon, removed with                         a cold snare. Resected and retrieved.                        - One 3 mm polyp in the rectum, removed with a cold                         snare. Resected and retrieved.                        - Diverticulosis in the sigmoid colon. Recommendation:        - Discharge patient to home.                        - Resume previous diet.                        - Continue present medications.                        - Await pathology results.                        -  If the pathology report reveals adenomatous tissue,                         then repeat the colonoscopy for surveillance in 7                         years. Procedure Code(s):     --- Professional ---                        [phone], Colonoscopy, flexible; with removal of                         tumor(s), polyp(s), or other lesion(s) by snare                         technique Diagnosis Code(s):     --- Professional ---                        Z12.11, Encounter for screening for  malignant neoplasm                         of colon                        D12.8, Benign neoplasm of rectum CPT copyright 2022 American Medical Association. All rights reserved. The codes documented in this report are preliminary and upon coder review may  be revised to meet current compliance requirements. Rogelia Copping MD, MD 08/20/2024 9:14:44 AM This report has been signed electronically. Number of Addenda: 0 Note Initiated On: 08/20/2024 8:54 AM Scope Withdrawal Time: 0 hours 6 minutes 53 seconds  Total Procedure Duration: 0 hours 13 minutes 52 seconds  Estimated Blood Loss:  Estimated blood loss: none.      [facility]

## 2024-08-20 NOTE — Anesthesia Postprocedure Evaluation (Signed)
 Anesthesia Post Note  Patient: Peter Rose  Procedure(s) Performed: COLONOSCOPY POLYPECTOMY, INTESTINE  Patient location during evaluation: PACU Anesthesia Type: General Level of consciousness: awake and alert Pain management: pain level controlled Vital Signs Assessment: post-procedure vital signs reviewed and stable Respiratory status: spontaneous breathing, nonlabored ventilation, respiratory function stable and patient connected to nasal cannula oxygen Cardiovascular status: blood pressure returned to baseline and stable Postop Assessment: no apparent nausea or vomiting Anesthetic complications: no   There were no known notable events for this encounter.   Last Vitals:  Vitals:   08/20/24 0929 08/20/24 0937  BP: 129/74 134/82  Pulse: 82 74  Resp: 14 (!) 21  Temp:    SpO2: 95% 97%    Last Pain:  Vitals:   08/20/24 0937  TempSrc:   PainSc: 0-No pain                 Lynwood KANDICE Clause

## 2024-08-20 NOTE — Transfer of Care (Signed)
 Immediate Anesthesia Transfer of Care Note  Patient: Peter Rose  Procedure(s) Performed: COLONOSCOPY POLYPECTOMY, INTESTINE  Patient Location: PACU  Anesthesia Type:General  Level of Consciousness: awake and sedated  Airway & Oxygen Therapy: Patient Spontanous Breathing and Patient connected to face mask oxygen  Post-op Assessment: Report given to RN and Post -op Vital signs reviewed and stable  Post vital signs: Reviewed and stable  Last Vitals:  Vitals Value Taken Time  BP 112/80 08/20/24 09:17  Temp    Pulse 87 08/20/24 09:18  Resp 16 08/20/24 09:18  SpO2 97 % 08/20/24 09:18  Vitals shown include unfiled device data.  Last Pain:  Vitals:   08/20/24 0839  TempSrc: Tympanic  PainSc: 0-No pain         Complications: There were no known notable events for this encounter.

## 2024-08-21 LAB — SURGICAL PATHOLOGY

## 2024-08-24 ENCOUNTER — Ambulatory Visit: Payer: Self-pay | Admitting: Gastroenterology

## 2024-09-28 ENCOUNTER — Ambulatory Visit: Payer: Self-pay

## 2024-09-28 NOTE — Telephone Encounter (Signed)
 FYI Only or Action Required?: FYI only for provider: appointment scheduled on tomorrow.  Patient was last seen in primary care on 06/09/2024 by Antonette Angeline ORN, NP.  Called Nurse Triage reporting Cough.  Symptoms began a week ago.  Interventions attempted: OTC medications: numerous cold and flu medications.  Symptoms are: unchanged.  Triage Disposition: See Physician Within 24 Hours  Patient/caregiver understands and will follow disposition?: Yes, will follow disposition  Copied from CRM #[id number]. Topic: Clinical - Red Word Triage >> Sep 28, 2024 10:04 AM Larissa RAMAN wrote: [facility] that prompted transfer to Nurse Triage: cough with mucous  x 1 week- worsening. Reason for Disposition  SEVERE coughing spells (e.g., whooping sound after coughing, vomiting after coughing)  Answer Assessment - Initial Assessment Questions 1. ONSET: When did the cough begin?      About a week 2. SEVERITY: How bad is the cough today?      severe 3. SPUTUM: Describe the color of your sputum (e.g., none, dry cough; clear, white, yellow, green)     Unsure, has not looked 4. HEMOPTYSIS: Are you coughing up any blood? If Yes, ask: How much? (e.g., flecks, streaks, tablespoons, etc.)     Denies 5. DIFFICULTY BREATHING: Are you having difficulty breathing? If Yes, ask: How bad is it? (e.g., mild, moderate, severe)      Denies  6. FEVER: Do you have a fever? If Yes, ask: What is your temperature, how was it measured, and when did it start?     denies 7. CARDIAC HISTORY: Do you have any history of heart disease? (e.g., heart attack, congestive heart failure)      Denies 8. LUNG HISTORY: Do you have any history of lung disease?  (e.g., pulmonary embolus, asthma, emphysema)     denies 9. PE RISK FACTORS: Do you have a history of blood clots? (or: recent major surgery, recent prolonged travel, bedridden)     denies 10. OTHER SYMPTOMS: Do you have any other symptoms? (e.g., runny nose,  wheezing, chest pain)       Sneezing,  12. TRAVEL: Have you traveled out of the country in the last month? (e.g., travel history, exposures)       denies  Protocols used: Cough - Acute Productive-A-AH

## 2024-09-28 NOTE — Telephone Encounter (Signed)
 Will discuss at upcoming appointment tomorrow

## 2024-09-28 NOTE — Progress Notes (Unsigned)
 Subjective:    Patient ID: Peter Rose, male    DOB: [birth year], 68 y.o.   MRN: [id number]  HPI  Discussed the use of AI scribe software for clinical note transcription with the patient, who gave verbal consent to proceed.  Peter Rose is a 68 year old male who presents with persistent cough and upper respiratory symptoms for the past week.  He has been experiencing persistent coughing, especially at night, for the past seven days. The cough is relentless and is accompanied by sneezing, nasal congestion and rhinorrhea. No shortness of breath, nausea, vomiting, or diarrhea. He also reports chills and feeling unusually hot at night, which is atypical for him.  Initially, he had headaches, which have since become less frequent and not severe. No sinus pressure, ear pain, sore throat, or any pain elsewhere. When he blows his nose, he notes regular mucus without blood. He has not checked the color of the mucus when coughing.  For symptom relief, he has been taking alka seltzer plus day and night, mucinex, and another unspecified cold and flu pill at night. He also uses various cough syrups and cough drops, although he finds them only temporarily effective.  He does not smoke and is allergic to codeine, which causes adverse reactions. He prefers cough syrup without pain medication.       Review of Systems   Past Medical History:  Diagnosis Date   Complication of anesthesia    abdominal distention (brief ileus ves opioid induced constipation) following 09/10/17 knee arthroscopy   COVID    GERD (gastroesophageal reflux disease)    occasional   Medical history non-contributory    Psoriasis     Current Outpatient Medications  Medication Sig Dispense Refill   aspirin  EC 81 MG tablet Take 1 tablet (81 mg total) by mouth daily. Swallow whole. 30 tablet 12   atorvastatin  (LIPITOR) 10 MG tablet Take 1 tablet (10 mg total) by mouth daily. 90 tablet 1   cyclobenzaprine  (FLEXERIL ) 5 MG  tablet Take 5 mg by mouth 2 (two) times daily.     ibuprofen  (ADVIL ) 200 MG tablet Take 600-800 mg by mouth every 6 (six) hours as needed for moderate pain.     levocetirizine (XYZAL ) 5 MG tablet TAKE 1 TABLET BY MOUTH AT BEDTIME AS NEEDED FOR ALLERGIES 90 tablet 0   lisinopril -hydrochlorothiazide  (ZESTORETIC ) 10-12.5 MG tablet TAKE 1/2 TABLET BY MOUTH DAILY 45 tablet 0   MEDROL  4 MG TBPK tablet Take by mouth as directed.     meloxicam (MOBIC) 15 MG tablet Take 15 mg by mouth daily.     methotrexate  (RHEUMATREX) 2.5 MG tablet TAKE 10 TABLETS BY MOUTH ONCE A WEEK WHEN NEEDED FOR PSORIASIS FLARE. 90 tablet 1   sildenafil  (VIAGRA ) 100 MG tablet TAKE 1 TABLET BY MOUTH AS NEEDED FOR ERECTILE DYSFUNCTION 10 tablet 3   traMADol  (ULTRAM ) 50 MG tablet Take 50 mg by mouth every 6 (six) hours as needed.     traZODone  (DESYREL ) 100 MG tablet TAKE 2 TABLETS BY MOUTH EVERY NIGHT AT BEDTIME 180 tablet 1   No current facility-administered medications for this visit.    Allergies[1]  Family History  Problem Relation Age of Onset   Colon cancer Neg Hx    Prostate cancer Neg Hx     Social History   Socioeconomic History   Marital status: Single    Spouse name: Not on file   Number of children: 3   Years of education: 55  Highest education level: Not on file  Occupational History   Occupation: [employer]  Tobacco Use   Smoking status: Former    Current packs/day: 0.00    Average packs/day: 1.0 packs/day    Types: Cigarettes    Quit date: 01/06/2017    Years since quitting: 7.7   Smokeless tobacco: Never  Vaping Use   Vaping status: Never Used  Substance and Sexual Activity   Alcohol use: Yes    Comment: occasional   Drug use: No   Sexual activity: Not on file  Other Topics Concern   Not on file  Social History Narrative   Lives alone   Caffeine use:  2 cups - coffee   Tea daily   Social Drivers of Health   Tobacco Use: Medium Risk (08/20/2024)   Patient History    Smoking Tobacco  Use: Former    Smokeless Tobacco Use: Never    Passive Exposure: Not on Actuary Strain: Low Risk (10/25/2023)   Overall Financial Resource Strain (CARDIA)    Difficulty of Paying Living Expenses: Not hard at all  Food Insecurity: No Food Insecurity (10/25/2023)   Hunger Vital Sign    Worried About Running Out of Food in the Last Year: Never true    Ran Out of Food in the Last Year: Never true  Transportation Needs: No Transportation Needs (10/25/2023)   PRAPARE - Administrator, Civil Service (Medical): No    Lack of Transportation (Non-Medical): No  Physical Activity: Insufficiently Active (10/25/2023)   Exercise Vital Sign    Days of Exercise per Week: 2 days    Minutes of Exercise per Session: 20 min  Stress: No Stress Concern Present (10/25/2023)   [employer] of Occupational Health - Occupational Stress Questionnaire    Feeling of Stress : Not at all  Social Connections: Moderately Integrated (10/25/2023)   Social Connection and Isolation Panel    Frequency of Communication with Friends and Family: More than three times a week    Frequency of Social Gatherings with Friends and Family: Twice a week    Attends Religious Services: Never    Database Administrator or Organizations: Yes    Attends Engineer, Structural: More than 4 times per year    Marital Status: Married  Catering Manager Violence: Not At Risk (10/25/2023)   Humiliation, Afraid, Rape, and Kick questionnaire    Fear of Current or Ex-Partner: No    Emotionally Abused: No    Physically Abused: No    Sexually Abused: No  Depression (PHQ2-9): Low Risk (06/09/2024)   Depression (PHQ2-9)    PHQ-2 Score: 0  Alcohol Screen: Low Risk (10/25/2023)   Alcohol Screen    Last Alcohol Screening Score (AUDIT): 1  Housing: Unknown (10/25/2023)   Housing Stability Vital Sign    Unable to Pay for Housing in the Last Year: No    Number of Times Moved in the Last Year: Not on file    Homeless  in the Last Year: No  Utilities: Not At Risk (10/25/2023)   AHC Utilities    Threatened with loss of utilities: No  Health Literacy: Adequate Health Literacy (10/25/2023)   B1300 Health Literacy    Frequency of need for help with medical instructions: Never     Constitutional: Pt reports headache, chills. Denies fever, malaise, fatigue, or abrupt weight changes.  HEENT: Pt reports runny nose, nasal congestion. Denies eye pain, eye redness, ear pain, ringing in the  ears, wax buildup, bloody nose, or sore throat. Respiratory: Patient reports cough.  Denies difficulty breathing, shortness of breath.   Cardiovascular: Denies chest pain, chest tightness, palpitations or swelling in the hands or feet.  Gastrointestinal: Denies abdominal pain, bloating, constipation, diarrhea or blood in the stool.  Musculoskeletal: Denies decrease in range of motion, difficulty with gait, muscle pain or joint pain and swelling.  Neurological: Patient reports insomnia.  Denies dizziness, difficulty with memory, difficulty with speech or problems with balance and coordination.    No other specific complaints in a complete review of systems (except as listed in HPI above).      Objective:   Physical Exam  BP 128/74 (BP Location: Right Arm, Patient Position: Sitting, Cuff Size: Large)   Pulse 89   Ht 5' 10 (1.778 m)   Wt 219 lb 6.4 oz (99.5 kg)   SpO2 98%   BMI 31.48 kg/m   Wt Readings from Last 3 Encounters:  08/20/24 206 lb 12.8 oz (93.8 kg)  07/09/24 204 lb (92.5 kg)  06/09/24 205 lb 9.6 oz (93.3 kg)    General: Appears his stated age, obese, in NAD. Skin: Warm, dry and intact.  HEENT: Head: normal shape and size, no sinus tenderness noted; Eyes: sclera white, no icterus, conjunctiva pink, PERRLA and EOMs intact; Nose: mucosa pink and moist, septum midline; Throat/Mouth: Teeth present, mucosa erythematous and moist, + PND, no exudate, lesions or ulcerations noted.  Neck: No adenopathy  noted. Cardiovascular: Normal rate and rhythm. S1,S2 noted.  No murmur, rubs or gallops noted. No JVD or BLE edema. No carotid bruits noted. Pulmonary/Chest: Normal effort and positive vesicular breath sounds. No respiratory distress. No wheezes, rales or ronchi noted.  Musculoskeletal:  No difficulty with gait.  Neurological: Alert and oriented.   BMET    Component Value Date/Time   NA 139 06/09/2024 0923   K 4.1 06/09/2024 0923   CL 103 06/09/2024 0923   CO2 28 06/09/2024 0923   GLUCOSE 113 (H) 06/09/2024 0923   BUN 16 06/09/2024 0923   CREATININE 1.12 06/09/2024 0923   CALCIUM  9.6 06/09/2024 0923   GFRNONAA 59 (L) 09/12/2017 0355   GFRAA >60 09/12/2017 0355    Lipid Panel     Component Value Date/Time   CHOL 189 06/09/2024 0923   TRIG 265 (H) 06/09/2024 0923   HDL 51 06/09/2024 0923   CHOLHDL 3.7 06/09/2024 0923   VLDL 19.4 12/17/2019 1124   LDLCALC 100 (H) 06/09/2024 0923    CBC    Component Value Date/Time   WBC 4.2 06/09/2024 0923   RBC 4.33 06/09/2024 0923   HGB 14.0 06/09/2024 0923   HCT 41.7 06/09/2024 0923   PLT 191 06/09/2024 0923   MCV 96.3 06/09/2024 0923   MCH 32.3 06/09/2024 0923   MCHC 33.6 06/09/2024 0923   RDW 13.6 06/09/2024 0923   LYMPHSABS 1.7 09/10/2017 0612   MONOABS 0.6 09/10/2017 0612   EOSABS 0.2 09/10/2017 0612   BASOSABS 0.1 09/10/2017 0612    Hgb A1C Lab Results  Component Value Date   HGBA1C 5.7 (H) 06/09/2024            Assessment & Plan:    Assessment and Plan    Upper respiratory infection with cough, allergic rhinitis Persistent cough and runny nose suggest upper respiratory infection with postnasal drip. Lungs clear, ruling out bronchitis or pneumonia. Allergic rhinitis considered but less likely. - Prescribed prednisone  10 mg 6-day taper for inflammation and symptom relief. - Prescribed  promethazine  DM for cough. - Prescribed azithromycin  250 mg x 5 days to start in 3 days if symptoms worsen - Would recommend  Zyrtec  10 mg OTC daily for the next 7 to 10 days  Allergic rhinitis Symptoms suggest allergic rhinitis.  -Would recommend Zyrtec  10 mg OTC daily for the next 7 to 10 days      RTC in 2 months for follow-up of chronic conditions Angeline Laura, NP     [1]  Allergies Allergen Reactions   Codeine Other (See Comments)    feels wired and jittery   Hycodan [Hydrocodone  Bit-Homatrop Mbr]     Makes pt feel weird/jittery   Vicodin [Hydrocodone -Acetaminophen ] Other (See Comments)    Pt feels wired with pain meds

## 2024-09-29 ENCOUNTER — Other Ambulatory Visit: Payer: Self-pay | Admitting: Internal Medicine

## 2024-09-29 ENCOUNTER — Encounter: Payer: Self-pay | Admitting: Internal Medicine

## 2024-09-29 ENCOUNTER — Ambulatory Visit: Admitting: Internal Medicine

## 2024-09-29 VITALS — BP 128/74 | HR 89 | Ht 70.0 in | Wt 219.4 lb

## 2024-09-29 DIAGNOSIS — J3089 Other allergic rhinitis: Secondary | ICD-10-CM

## 2024-09-29 DIAGNOSIS — J069 Acute upper respiratory infection, unspecified: Secondary | ICD-10-CM

## 2024-09-29 MED ORDER — AZITHROMYCIN 250 MG PO TABS: ORAL_TABLET | ORAL | 0 refills | Status: AC

## 2024-09-29 MED ORDER — PROMETHAZINE-DM 6.25-15 MG/5ML PO SYRP: 5.0000 mL | ORAL_SOLUTION | Freq: Four times a day (QID) | ORAL | 0 refills | Status: AC | PRN

## 2024-09-29 MED ORDER — PREDNISONE 10 MG PO TABS: ORAL_TABLET | ORAL | 0 refills | Status: AC

## 2024-09-29 NOTE — Telephone Encounter (Unsigned)
 Copied from CRM #[id number]. Topic: Clinical - Medication Refill >> Sep 29, 2024 12:31 PM Amy B wrote: Medication:  levocetirizine (XYZAL ) 5 MG tablet  Has the patient contacted their pharmacy? Yes (Agent: If no, request that the patient contact the pharmacy for the refill. If patient does not wish to contact the pharmacy document the reason why and proceed with request.) (Agent: If yes, when and what did the pharmacy advise?)  This is the patient's preferred pharmacy:  [id number] [place], [place] - [address] AT [facility] OF [address] [place] [address] Phone: [phone] Fax: [phone]  Is this the correct pharmacy for this prescription? Yes If no, delete pharmacy and type the correct one.   Has the prescription been filled recently? No  Is the patient out of the medication? Yes  Has the patient been seen for an appointment in the last year OR does the patient have an upcoming appointment? Yes  Can we respond through MyChart? Yes  Agent: Please be advised that Rx refills may take up to 3 business days. We ask that you follow-up with your pharmacy.

## 2024-09-29 NOTE — Patient Instructions (Signed)
 Allergic Rhinitis, Adult  Allergic rhinitis is a reaction to allergens. Allergens are things that can cause an allergic reaction. This condition affects the lining inside the nose (mucous membrane). There are two types of allergic rhinitis: Seasonal. This type is also called hay fever. It happens only during some times of the year. Perennial. This type can happen at any time of the year. This condition cannot be spread from person to person (is not contagious). It can be mild, bad, or very bad. It can develop at any age and may be outgrown. What are the causes? Pollen from grasses, trees, and weeds. Other causes can be: Dust mites. Smoke. Mold. Car fumes. The pee (urine), spit, or dander of pets. Dander is dead skin cells from a pet. What increases the risk? You are more likely to develop this condition if: You have allergies in your family. You have problems like allergies in your family. You may have: Swelling of parts of your eyes and eyelids. Asthma. This affects how you breathe. Long-term redness and swelling on your skin. Food allergies. What are the signs or symptoms? The main symptom of this condition is a runny or stuffy nose (nasal congestion). Other symptoms may include: Sneezing or coughing. Itching and tearing of your eyes. Mucus that drips down the back of your throat (postnasal drip). This may cause a sore throat. Trouble sleeping. Feeling tired. Headache. How is this treated? There is no cure for this condition. You should avoid things that you are allergic to. Treatment can help to relieve symptoms. This may include: Medicines that block allergy symptoms, such as anti-inflammatories or antihistamines. These may be given as a shot, nasal spray, or pill. Avoiding things you are allergic to. Medicines that give you some of what you are allergic to over time. This is called immunotherapy. It is done if other treatments do not help. You may get: Shots. Medicine under  your tongue. Stronger medicines, if other treatments do not help. Follow these instructions at home: Avoiding allergens Find out what things you are allergic to and avoid them. To do this, try these things: If you get allergies any time of year: Replace carpet with wood, tile, or vinyl flooring. Carpet can trap pet dander and dust. Do not smoke. Do not allow smoking in your home. Change your heating and air conditioning filters at least once a month. If you get allergies only some times of the year: Keep windows closed when you can. Plan things to do outside when pollen counts are lowest. Check pollen counts before you plan things to do outside. When you come indoors, change your clothes and shower before you sit on furniture or bedding. If you are allergic to a pet: Keep the pet out of your bedroom. Vacuum, sweep, and dust often. General instructions Take over-the-counter and prescription medicines only as told by your doctor. Drink enough fluid to keep your pee pale yellow. Where to find more information American Academy of Allergy, Asthma & Immunology: aaaai.org Contact a doctor if: You have a fever. You get a cough that does not go away. You make high-pitched whistling sounds when you breathe, most often when you breathe out (wheeze). Your symptoms slow you down. Your symptoms stop you from doing your normal things each day. Get help right away if: You are short of breath. This symptom may be an emergency. Get help right away. Call 911. Do not wait to see if the symptom will go away. Do not drive yourself to the  hospital. This information is not intended to replace advice given to you by your health care provider. Make sure you discuss any questions you have with your health care provider. Document Revised: 06/11/2022 Document Reviewed: 06/11/2022 Elsevier Patient Education  2024 [employer].

## 2024-10-02 ENCOUNTER — Other Ambulatory Visit: Payer: Self-pay

## 2024-10-02 ENCOUNTER — Telehealth: Payer: Self-pay

## 2024-10-02 ENCOUNTER — Encounter: Payer: Self-pay | Admitting: Pharmacist

## 2024-10-02 MED ORDER — LEVOCETIRIZINE DIHYDROCHLORIDE 5 MG PO TABS: ORAL_TABLET | ORAL | 0 refills | Status: DC

## 2024-10-02 MED ORDER — LEVOCETIRIZINE DIHYDROCHLORIDE 5 MG PO TABS: ORAL_TABLET | ORAL | 0 refills | Status: AC

## 2024-10-02 NOTE — Telephone Encounter (Signed)
 Requested Prescriptions  Pending Prescriptions Disp Refills   levocetirizine (XYZAL ) 5 MG tablet [Pharmacy Med Name: LEVOCETIRIZINE 5MG  TABLETS] 90 tablet 0    Sig: TAKE 1 TABLET BY MOUTH AT BEDTIME AS NEEDED FOR ALLERGIES     Ear, Nose, and Throat:  Antihistamines - levocetirizine dihydrochloride  Passed - 10/02/2024 10:56 AM      Passed - Cr in normal range and within 360 days    Creat  Date Value Ref Range Status  06/09/2024 1.12 0.70 - 1.35 mg/dL Final         Passed - eGFR is 10 or above and within 360 days    GFR calc Af Amer  Date Value Ref Range Status  09/12/2017 >60 >60 mL/min Final    Comment:    (NOTE) The eGFR has been calculated using the CKD EPI equation. This calculation has not been validated in all clinical situations. eGFR's persistently <60 mL/min signify possible Chronic Kidney Disease.    GFR calc non Af Amer  Date Value Ref Range Status  09/12/2017 59 (L) >60 mL/min Final   GFR  Date Value Ref Range Status  10/28/2020 72.21 >60.00 mL/min Final    Comment:    Calculated using the CKD-EPI Creatinine Equation (2021)   eGFR  Date Value Ref Range Status  06/09/2024 72 > OR = 60 mL/min/1.33m2 Final         Passed - Valid encounter within last 12 months    Recent Outpatient Visits           3 days ago Non-seasonal allergic rhinitis due to other allergic trigger   [facility] [place], Angeline ORN, NP   3 months ago Encounter for general adult medical examination with abnormal findings   [facility] [place], Angeline ORN, NP       Future Appointments             In 9 months [place], Penne SAUNDERS, MD [facility]

## 2024-10-02 NOTE — Progress Notes (Signed)
" ° °  10/02/2024  Patient ID: Peter Rose, male   DOB: 07-24-1956, 68 y.o.   MRN: [id number]  This patient is appearing on a report for the adherence measure for cholesterol (statin) medications this calendar year.   Medication: atorvastatin  10 mg Last fill date: 09/15/2024 for 90 day supply  Lab Results  Component Value Date   CHOL 189 06/09/2024   HDL 51 06/09/2024   LDLCALC 100 (H) 06/09/2024   LDLDIRECT 97.0 12/26/2016   TRIG 265 (H) 06/09/2024   CHOLHDL 3.7 06/09/2024     Per review of results follow up from 06/10/2024, note patient restarted atorvastatin  since latest lipid panel checked.  Insurance report was not up to date. No action needed at this time.   Sharyle Sia, PharmD, [facility] [phone]    "

## 2024-10-02 NOTE — Telephone Encounter (Signed)
 Copied from CRM #[id number]. Topic: Clinical - Prescription Issue >> Oct 02, 2024 10:17 AM Robinson H wrote: Reason for CRM: Patient calling upset regarding refill for his levocetirizine (XYZAL ) 5 MG tablet, states he's been waiting and now is out and has a sinus infection. States he was told medication would be expedited, advised patient of refill request turnaround time, refill pending as of 12/16.  Peter Rose [phone]

## 2024-10-02 NOTE — Telephone Encounter (Signed)
 Duplicate request, filled 10/02/24 Requested Prescriptions  Pending Prescriptions Disp Refills   levocetirizine (XYZAL ) 5 MG tablet 90 tablet 0    Sig: TAKE 1 TABLET BY MOUTH AT BEDTIME AS NEEDED FOR ALLERGIES     Ear, Nose, and Throat:  Antihistamines - levocetirizine dihydrochloride  Passed - 10/02/2024 11:08 AM      Passed - Cr in normal range and within 360 days    Creat  Date Value Ref Range Status  06/09/2024 1.12 0.70 - 1.35 mg/dL Final         Passed - eGFR is 10 or above and within 360 days    GFR calc Af Amer  Date Value Ref Range Status  09/12/2017 >60 >60 mL/min Final    Comment:    (NOTE) The eGFR has been calculated using the CKD EPI equation. This calculation has not been validated in all clinical situations. eGFR's persistently <60 mL/min signify possible Chronic Kidney Disease.    GFR calc non Af Amer  Date Value Ref Range Status  09/12/2017 59 (L) >60 mL/min Final   GFR  Date Value Ref Range Status  10/28/2020 72.21 >60.00 mL/min Final    Comment:    Calculated using the CKD-EPI Creatinine Equation (2021)   eGFR  Date Value Ref Range Status  06/09/2024 72 > OR = 60 mL/min/1.75m2 Final         Passed - Valid encounter within last 12 months    Recent Outpatient Visits           3 days ago Non-seasonal allergic rhinitis due to other allergic trigger   [facility] [place], Angeline ORN, NP   3 months ago Encounter for general adult medical examination with abnormal findings   [facility] [place], Angeline ORN, NP       Future Appointments             In 9 months Georganne, Penne SAUNDERS, MD [facility]

## 2024-10-02 NOTE — Telephone Encounter (Signed)
 Medication resent to pharmacy

## 2024-10-09 ENCOUNTER — Other Ambulatory Visit: Payer: Self-pay | Admitting: Internal Medicine

## 2024-10-09 ENCOUNTER — Telehealth: Payer: Self-pay

## 2024-10-09 NOTE — Telephone Encounter (Signed)
 Copied from CRM #[id number]. Topic: Clinical - Prescription Issue >> Oct 09, 2024 11:26 AM Kevelyn M wrote: Reason for CRM: patient calling and was frustrated because he's going out of town tomorrow and just found out he has not refills left on the lisinopril -hydrochlorothiazide  (ZESTORETIC ) 10-12.5 MG tablet. He said he cannot wait 72 hours. He needs this to be pushed through to pick up today. >> Oct 09, 2024  4:11 PM Sophia H wrote: Patient states he is at the pharmacy and RX Is still showing to take 1/2 tablet per day and he is taking 1 whole tablet. Patient would like this fixed ASAP, he's upset & requesting to speak with the nurse. No answer at [facility], please reach out, # [phone]

## 2024-10-10 ENCOUNTER — Other Ambulatory Visit: Payer: Self-pay | Admitting: Internal Medicine

## 2024-10-11 MED ORDER — LISINOPRIL-HYDROCHLOROTHIAZIDE 10-12.5 MG PO TABS: 1.0000 | ORAL_TABLET | Freq: Every day | ORAL | 0 refills | Status: AC

## 2024-10-11 NOTE — Telephone Encounter (Signed)
 Updated RX sent to pharmacy

## 2024-10-11 NOTE — Addendum Note (Signed)
 Addended by: ANTONETTE ANGELINE ORN on: 10/11/2024 07:53 PM   Modules accepted: Orders

## 2024-10-12 NOTE — Telephone Encounter (Signed)
 Requested Prescriptions  Refused Prescriptions Disp Refills   levocetirizine (XYZAL ) 5 MG tablet [Pharmacy Med Name: LEVOCETIRIZINE 5MG  TABLETS] 90 tablet 0    Sig: TAKE 1 TABLET BY MOUTH AT BEDTIME AS NEEDED FOR ALLERGIES     Ear, Nose, and Throat:  Antihistamines - levocetirizine dihydrochloride  Passed - 10/12/2024  4:41 PM      Passed - Cr in normal range and within 360 days    Creat  Date Value Ref Range Status  06/09/2024 1.12 0.70 - 1.35 mg/dL Final         Passed - eGFR is 10 or above and within 360 days    GFR calc Af Amer  Date Value Ref Range Status  09/12/2017 >60 >60 mL/min Final    Comment:    (NOTE) The eGFR has been calculated using the CKD EPI equation. This calculation has not been validated in all clinical situations. eGFR's persistently <60 mL/min signify possible Chronic Kidney Disease.    GFR calc non Af Amer  Date Value Ref Range Status  09/12/2017 59 (L) >60 mL/min Final   GFR  Date Value Ref Range Status  10/28/2020 72.21 >60.00 mL/min Final    Comment:    Calculated using the CKD-EPI Creatinine Equation (2021)   eGFR  Date Value Ref Range Status  06/09/2024 72 > OR = 60 mL/min/1.65m2 Final         Passed - Valid encounter within last 12 months    Recent Outpatient Visits           1 week ago Non-seasonal allergic rhinitis due to other allergic trigger   [facility] Pa[place], Angeline ORN, NP   4 months ago Encounter for general adult medical examination with abnormal findings   [facility] [place], Angeline ORN, NP       Future Appointments             In 8 months [place], Penne SAUNDERS, MD [facility]

## 2024-11-04 ENCOUNTER — Ambulatory Visit: Admitting: Emergency Medicine

## 2024-11-04 VITALS — BP 123/60 | Ht 70.0 in | Wt 218.0 lb

## 2024-11-04 DIAGNOSIS — Z0000 Encounter for general adult medical examination without abnormal findings: Secondary | ICD-10-CM

## 2024-11-04 NOTE — Patient Instructions (Signed)
 Peter Rose,  Thank you for taking the time for your Medicare Wellness Visit. I appreciate your continued commitment to your health goals. Please review the care plan we discussed, and feel free to reach out if I can assist you further.  Please note that Annual Wellness Visits do not include a physical exam. Some assessments may be limited, especially if the visit was conducted virtually. If needed, we may recommend an in-person follow-up with your provider.  Ongoing Care Seeing your primary care provider every 3 to 6 months helps us  monitor your health and provide consistent, personalized care.   Referrals If a referral was made during today's visit and you haven't received any updates within two weeks, please contact the referred provider directly to check on the status.  Recommended Screenings:  Health Maintenance  Topic Date Due   Zoster (Shingles) Vaccine (1 of 2) Never done   Medicare Annual Wellness Visit  10/24/2024   Flu Shot  02/15/2025*   DTaP/Tdap/Td vaccine (2 - Td or Tdap) 06/09/2025*   Pneumococcal Vaccine for age over 69 (1 of 1 - PCV) 06/09/2025*   Colon Cancer Screening  08/21/2031   Hepatitis C Screening  Completed   Meningitis B Vaccine  Aged Out   COVID-19 Vaccine  Discontinued   Cologuard (Stool DNA test)  Discontinued  *Topic was postponed. The date shown is not the original due date.       11/04/2024    9:37 AM  Advanced Directives  Does Patient Have a Medical Advance Directive? No  Would patient like information on creating a medical advance directive? Yes (MAU/Ambulatory/Procedural Areas - Information given)  Information on Advanced Care Planning can be found at [place]  Secretary of [facility] Advance Health Care Directives Advance Health Care Directives ([email or web address]  You may also get the forms at your doctor's office.  Vision: Annual vision screenings are recommended for early detection of glaucoma, cataracts, and diabetic retinopathy. These exams can  also reveal signs of chronic conditions such as diabetes and high blood pressure.  Dental: Annual dental screenings help detect early signs of oral cancer, gum disease, and other conditions linked to overall health, including heart disease and diabetes.  Please see the attached documents for additional preventive care recommendations.

## 2024-11-04 NOTE — Progress Notes (Signed)
 "  Chief Complaint  Patient presents with   Medicare Wellness     Subjective:   Peter Rose is a 69 y.o. male who presents for a Medicare Annual Wellness Visit.  Visit info / Clinical Intake: Medicare Wellness Visit Type:: Subsequent Annual Wellness Visit Persons participating in visit and providing information:: patient Medicare Wellness Visit Mode:: In-person (required for WTM) Interpreter Needed?: No Pre-visit prep was completed: yes AWV questionnaire completed by patient prior to visit?: no Living arrangements:: lives with spouse/significant other Patient's Overall Health Status Rating: very good Typical amount of pain: (!) a lot Does pain affect daily life?: (!) yes Are you currently prescribed opioids?: no  Dietary Habits and Nutritional Risks How many meals a day?: 2 Eats fruit and vegetables daily?: (!) no Most meals are obtained by: preparing own meals In the last 2 weeks, have you had any of the following?: none Diabetic:: no  Functional Status Activities of Daily Living (to include ambulation/medication): Independent Ambulation: Independent with device- listed below Home Assistive Devices/Equipment: Contact lenses Medication Administration: Independent Home Management (perform basic housework or laundry): Independent Manage your own finances?: yes Primary transportation is: driving Concerns about vision?: no *vision screening is required for WTM* Concerns about hearing?: no  Fall Screening Falls in the past year?: 1 Number of falls in past year: 0 Was there an injury with Fall?: 1 Fall Risk Category Calculator: 2 Patient Fall Risk Level: Moderate Fall Risk  Fall Risk Patient at Risk for Falls Due to: History of fall(s); Orthopedic patient Fall risk Follow up: Falls evaluation completed; Education provided  Home and Transportation Safety: All rugs have non-skid backing?: yes All stairs or steps have railings?: yes Grab bars in the bathtub or  shower?: (!) no Have non-skid surface in bathtub or shower?: yes Good home lighting?: yes Regular seat belt use?: (!) no Hospital stays in the last year:: no  Cognitive Assessment Difficulty concentrating, remembering, or making decisions? : no Will 6CIT or Mini Cog be Completed: yes What year is it?: 0 points What month is it?: 0 points Give patient an address phrase to remember (5 components): [address] [place] About what time is it?: 0 points Count backwards from 20 to 1: 0 points Say the months of the year in reverse: 0 points Repeat the address phrase from earlier: 0 points 6 CIT Score: 0 points  Advance Directives (For Healthcare) Does Patient Have a Medical Advance Directive?: No Would patient like information on creating a medical advance directive?: Yes (MAU/Ambulatory/Procedural Areas - Information given)  Reviewed/Updated  Reviewed/Updated: Reviewed All (Medical, Surgical, Family, Medications, Allergies, Care Teams, Patient Goals)    Allergies (verified) Codeine, Hycodan [hydrocodone  bit-homatrop mbr], and Vicodin [hydrocodone -acetaminophen ]   Current Medications (verified) Outpatient Encounter Medications as of 11/04/2024  Medication Sig   aspirin  EC 81 MG tablet Take 1 tablet (81 mg total) by mouth daily. Swallow whole.   atorvastatin  (LIPITOR) 10 MG tablet Take 1 tablet (10 mg total) by mouth daily.   ibuprofen  (ADVIL ) 200 MG tablet Take 600-800 mg by mouth every 6 (six) hours as needed for moderate pain.   levocetirizine (XYZAL ) 5 MG tablet TAKE 1 TABLET BY MOUTH AT BEDTIME AS NEEDED FOR ALLERGIES   lisinopril -hydrochlorothiazide  (ZESTORETIC ) 10-12.5 MG tablet Take 1 tablet by mouth daily.   methotrexate  (RHEUMATREX) 2.5 MG tablet TAKE 10 TABLETS BY MOUTH ONCE A WEEK WHEN NEEDED FOR PSORIASIS FLARE.   sildenafil  (VIAGRA ) 100 MG tablet TAKE 1 TABLET BY MOUTH AS NEEDED FOR  ERECTILE DYSFUNCTION   traZODone  (DESYREL ) 100 MG tablet TAKE 2 TABLETS BY MOUTH EVERY  NIGHT AT BEDTIME   azithromycin  (ZITHROMAX ) 250 MG tablet Take 2 tabs today, then 1 tab daily x 4 days (Patient not taking: Reported on 11/04/2024)   cyclobenzaprine  (FLEXERIL ) 5 MG tablet Take 5 mg by mouth 2 (two) times daily. (Patient not taking: Reported on 11/04/2024)   MEDROL  4 MG TBPK tablet Take by mouth as directed. (Patient not taking: Reported on 11/04/2024)   meloxicam (MOBIC) 15 MG tablet Take 15 mg by mouth daily. (Patient not taking: Reported on 11/04/2024)   predniSONE  (DELTASONE ) 10 MG tablet Take 6 tabs on day 1, 5 tabs on day 2, 4 tabs on day 3, 3 tabs on day 4, 2 tabs on day 5, 1 tab on day 6 (Patient not taking: Reported on 11/04/2024)   promethazine -dextromethorphan (PROMETHAZINE -DM) 6.25-15 MG/5ML syrup Take 5 mLs by mouth 4 (four) times daily as needed. (Patient not taking: Reported on 11/04/2024)   traMADol  (ULTRAM ) 50 MG tablet Take 50 mg by mouth every 6 (six) hours as needed. (Patient not taking: Reported on 11/04/2024)   No facility-administered encounter medications on file as of 11/04/2024.    History: Past Medical History:  Diagnosis Date   Complication of anesthesia    abdominal distention (brief ileus ves opioid induced constipation) following 09/10/17 knee arthroscopy   COVID    GERD (gastroesophageal reflux disease)    occasional   Medical history non-contributory    Psoriasis    Past Surgical History:  Procedure Laterality Date   COLONOSCOPY N/A 08/20/2024   Procedure: COLONOSCOPY;  Surgeon: Jinny Carmine, MD;  [facility] END[facility]COPY;  Service: Endoscopy;  Laterality: N/A;   ELBOW SURGERY Right    ligament repar 11/2017   KNEE ARTHROSCOPY WITH ANTERIOR CRUCIATE LIGAMENT (ACL) REPAIR WITH HAMSTRING GRAFT Right 09/10/2017   Procedure: KNEE ARTHROSCOPY WITH ANTERIOR CRUCIATE LIGAMENT (ACL) REPAIR WITH HAMSTRING GRAFT AND  ALLOGRAFT ,& MEDIAL MENISCECTOMY;  Surgeon: Marchia Drivers, MD;  Location: [facility] ORS;  Service: Orthopedics;  Laterality: Right;   LUMBAR  LAMINECTOMY/DECOMPRESSION MICRODISCECTOMY Right 09/30/2020   Procedure: Right Lumbar four-five Laminectomy for facet/synovial cyst;  Surgeon: Colon Shove, MD;  Location: [facility] OR;  Service: Neurosurgery;  Laterality: Right;   MELANOMA EXCISION Left 11/23/2021   Left Leg   MOHS SURGERY  11/14/2021   POLYPECTOMY  08/20/2024   Procedure: POLYPECTOMY, INTESTINE;  Surgeon: Jinny Carmine, MD;  Location: [facility] ENDOSCOPY;  Service: Endoscopy;;   Family History  Problem Relation Age of Onset   Colon cancer Neg Hx    Prostate cancer Neg Hx    Social History   Occupational History   Occupation: [employer]  Tobacco Use   Smoking status: Former    Current packs/day: 0.00    Average packs/day: 1 pack/day for 45.2 years (45.2 ttl pk-yrs)    Types: Cigarettes    Start date: 18    Quit date: 01/06/2017    Years since quitting: 7.8   Smokeless tobacco: Never  Vaping Use   Vaping status: Never Used  Substance and Sexual Activity   Alcohol use: Yes    Alcohol/week: 3.0 standard drinks of alcohol    Types: 3 Cans of beer per week    Comment: 2-3 beers on weekends   Drug use: No   Sexual activity: Not on file   Tobacco Counseling Counseling given: Not Answered  SDOH Screenings   Food Insecurity: No Food Insecurity (11/04/2024)  Housing: Low Risk (11/04/2024)  Transportation Needs: No Transportation Needs (11/04/2024)  Utilities: Not At Risk (11/04/2024)  Alcohol Screen: Low Risk (10/25/2023)  Depression (PHQ2-9): Low Risk (11/04/2024)  Financial Resource Strain: Low Risk (10/25/2023)  Physical Activity: Inactive (11/04/2024)  Social Connections: Moderately Integrated (11/04/2024)  Stress: No Stress Concern Present (11/04/2024)  Tobacco Use: Medium Risk (11/04/2024)  Health Literacy: Adequate Health Literacy (11/04/2024)   See flowsheets for full screening details  Depression Screen PHQ 2 & 9 Depression Scale- Over the past 2 weeks, how often have you been bothered by any of the following  problems? Little interest or pleasure in doing things: 0 Feeling down, depressed, or hopeless (PHQ Adolescent also includes...irritable): 0 PHQ-2 Total Score: 0 Trouble falling or staying asleep, or sleeping too much: 0 Feeling tired or having little energy: 0 Poor appetite or overeating (PHQ Adolescent also includes...weight loss): 0 Feeling bad about yourself - or that you are a failure or have let yourself or your family down: 0 Trouble concentrating on things, such as reading the newspaper or watching television (PHQ Adolescent also includes...like school work): 0 Moving or speaking so slowly that other people could have noticed. Or the opposite - being so fidgety or restless that you have been moving around a lot more than usual: 0 Thoughts that you would be better off dead, or of hurting yourself in some way: 0 PHQ-9 Total Score: 0 If you checked off any problems, how difficult have these problems made it for you to do your work, take care of things at home, or get along with other people?: Not difficult at all  Depression Treatment Depression Interventions/Treatment : [id number] Score <4 Follow-up Not Indicated     Goals Addressed             This Visit's Progress    DIET - INCREASE WATER INTAKE   Not on track            Objective:    Today's Vitals   11/04/24 0927  BP: 123/60  Weight: 218 lb (98.9 kg)  Height: 5' 10 (1.778 m)   Body mass index is 31.28 kg/m.  Hearing/Vision screen Hearing Screening - Comments:: Denies hearing loss  Vision Screening - Comments:: UTD w/ [facility] Immunizations and Health Maintenance Health Maintenance  Topic Date Due   Zoster Vaccines- Shingrix (1 of 2) Never done   Influenza Vaccine  02/15/2025 (Originally 05/15/2024)   DTaP/Tdap/Td (2 - Td or Tdap) 06/09/2025 (Originally 05/28/2022)   Pneumococcal Vaccine: 50+ Years (1 of 1 - PCV) 06/09/2025 (Originally 09/14/2005)   Medicare Annual Wellness (AWV)  11/04/2025    Colonoscopy  08/21/2031   Hepatitis C Screening  Completed   Meningococcal B Vaccine  Aged Out   COVID-19 Vaccine  Discontinued   Fecal DNA (Cologuard)  Discontinued        Assessment/Plan:  This is a routine wellness examination for Peter Rose.  Patient Care Team: Antonette Angeline ORN, NP as PCP - General (Internal Medicine) [place], [facility]) Jinny Carmine, MD as Consulting Physician (Gastroenterology) Georganne Penne SAUNDERS, MD as Consulting Physician (Urology) [place], [facility] (Dermatology) Sherida Adine BROCKS, MD as Consulting Physician (Orthopedic Surgery)  I have personally reviewed and noted the following in the patients chart:   Medical and social history Use of alcohol, tobacco or illicit drugs  Current medications and supplements including opioid prescriptions. Functional ability and status Nutritional status Physical activity Advanced directives List of other physicians Hospitalizations, surgeries, and ER visits in previous 12 months Vitals  Screenings to include cognitive, depression, and falls Referrals and appointments  No orders of the defined types were placed in this encounter.  In addition, I have reviewed and discussed with patient certain preventive protocols, quality metrics, and best practice recommendations. A written personalized care plan for preventive services as well as general preventive health recommendations were provided to patient.   Vina Ned, CMA   11/04/2024   Return in 53 weeks (on 11/10/2025) for Medicare Annual Wellness Visit.  After Visit Summary: (In Person-Declined) Patient declined AVS at this time.  Nurse Notes:  Declined all vaccines "

## 2024-12-08 ENCOUNTER — Ambulatory Visit: Admitting: Internal Medicine

## 2025-07-08 ENCOUNTER — Ambulatory Visit: Admitting: Urology

## 2025-11-10 ENCOUNTER — Ambulatory Visit
